# Patient Record
Sex: Female | Born: 1935 | Race: Black or African American | Hispanic: No | Smoking: Never smoker
Health system: Southern US, Community
[De-identification: ages and names within clinical notes are randomized; demographics above are authoritative.]

## PROBLEM LIST (undated history)

## (undated) DIAGNOSIS — H409 Unspecified glaucoma: Secondary | ICD-10-CM

## (undated) DIAGNOSIS — K219 Gastro-esophageal reflux disease without esophagitis: Secondary | ICD-10-CM

## (undated) DIAGNOSIS — M810 Age-related osteoporosis without current pathological fracture: Secondary | ICD-10-CM

## (undated) DIAGNOSIS — M48061 Spinal stenosis, lumbar region without neurogenic claudication: Secondary | ICD-10-CM

## (undated) DIAGNOSIS — R0602 Shortness of breath: Secondary | ICD-10-CM

## (undated) DIAGNOSIS — F419 Anxiety disorder, unspecified: Secondary | ICD-10-CM

## (undated) DIAGNOSIS — J45909 Unspecified asthma, uncomplicated: Secondary | ICD-10-CM

## (undated) DIAGNOSIS — E7800 Pure hypercholesterolemia, unspecified: Secondary | ICD-10-CM

## (undated) DIAGNOSIS — M1990 Unspecified osteoarthritis, unspecified site: Secondary | ICD-10-CM

## (undated) DIAGNOSIS — F329 Major depressive disorder, single episode, unspecified: Secondary | ICD-10-CM

## (undated) DIAGNOSIS — Z974 Presence of external hearing-aid: Secondary | ICD-10-CM

## (undated) DIAGNOSIS — F32A Depression, unspecified: Secondary | ICD-10-CM

## (undated) DIAGNOSIS — K5900 Constipation, unspecified: Secondary | ICD-10-CM

## (undated) DIAGNOSIS — Z8719 Personal history of other diseases of the digestive system: Secondary | ICD-10-CM

## (undated) DIAGNOSIS — R200 Anesthesia of skin: Secondary | ICD-10-CM

## (undated) DIAGNOSIS — D649 Anemia, unspecified: Secondary | ICD-10-CM

## (undated) DIAGNOSIS — I10 Essential (primary) hypertension: Secondary | ICD-10-CM

## (undated) DIAGNOSIS — R202 Paresthesia of skin: Secondary | ICD-10-CM

## (undated) DIAGNOSIS — J189 Pneumonia, unspecified organism: Secondary | ICD-10-CM

## (undated) DIAGNOSIS — M791 Myalgia, unspecified site: Secondary | ICD-10-CM

## (undated) HISTORY — PX: HERNIA REPAIR: SHX51

## (undated) HISTORY — PX: VASCULAR SURGERY: SHX849

## (undated) HISTORY — PX: CHOLECYSTECTOMY: SHX55

## (undated) HISTORY — PX: MULTIPLE TOOTH EXTRACTIONS: SHX2053

## (undated) HISTORY — PX: EYE SURGERY: SHX253

## (undated) HISTORY — PX: COLONOSCOPY: SHX174

## (undated) HISTORY — PX: APPENDECTOMY: SHX54

## (undated) HISTORY — PX: BACK SURGERY: SHX140

---

## 1999-01-13 ENCOUNTER — Other Ambulatory Visit: Admission: RE | Admit: 1999-01-13 | Discharge: 1999-01-13 | Payer: Self-pay | Admitting: Emergency Medicine

## 1999-07-15 ENCOUNTER — Ambulatory Visit (HOSPITAL_COMMUNITY): Admission: RE | Admit: 1999-07-15 | Discharge: 1999-07-15 | Payer: Self-pay | Admitting: Orthopedic Surgery

## 1999-07-15 ENCOUNTER — Encounter: Payer: Self-pay | Admitting: Orthopedic Surgery

## 1999-07-29 ENCOUNTER — Ambulatory Visit (HOSPITAL_COMMUNITY): Admission: RE | Admit: 1999-07-29 | Discharge: 1999-07-29 | Payer: Self-pay | Admitting: Orthopedic Surgery

## 1999-07-29 ENCOUNTER — Encounter: Payer: Self-pay | Admitting: Orthopedic Surgery

## 1999-08-12 ENCOUNTER — Encounter: Payer: Self-pay | Admitting: Orthopedic Surgery

## 1999-08-12 ENCOUNTER — Ambulatory Visit (HOSPITAL_COMMUNITY): Admission: RE | Admit: 1999-08-12 | Discharge: 1999-08-12 | Payer: Self-pay | Admitting: Orthopedic Surgery

## 1999-12-06 HISTORY — PX: OTHER SURGICAL HISTORY: SHX169

## 1999-12-17 ENCOUNTER — Encounter: Payer: Self-pay | Admitting: Emergency Medicine

## 1999-12-17 ENCOUNTER — Ambulatory Visit (HOSPITAL_COMMUNITY): Admission: RE | Admit: 1999-12-17 | Discharge: 1999-12-17 | Payer: Self-pay | Admitting: Emergency Medicine

## 2000-01-25 ENCOUNTER — Encounter: Admission: RE | Admit: 2000-01-25 | Discharge: 2000-01-25 | Payer: Self-pay | Admitting: Emergency Medicine

## 2000-01-25 ENCOUNTER — Encounter: Payer: Self-pay | Admitting: Emergency Medicine

## 2000-04-07 ENCOUNTER — Ambulatory Visit (HOSPITAL_COMMUNITY): Admission: RE | Admit: 2000-04-07 | Discharge: 2000-04-07 | Payer: Self-pay | Admitting: *Deleted

## 2000-04-07 ENCOUNTER — Encounter: Payer: Self-pay | Admitting: *Deleted

## 2000-06-08 ENCOUNTER — Encounter (HOSPITAL_BASED_OUTPATIENT_CLINIC_OR_DEPARTMENT_OTHER): Payer: Self-pay | Admitting: General Surgery

## 2000-06-12 ENCOUNTER — Encounter (INDEPENDENT_AMBULATORY_CARE_PROVIDER_SITE_OTHER): Payer: Self-pay | Admitting: *Deleted

## 2000-06-12 ENCOUNTER — Ambulatory Visit (HOSPITAL_COMMUNITY): Admission: RE | Admit: 2000-06-12 | Discharge: 2000-06-12 | Payer: Self-pay | Admitting: General Surgery

## 2000-07-24 ENCOUNTER — Ambulatory Visit (HOSPITAL_BASED_OUTPATIENT_CLINIC_OR_DEPARTMENT_OTHER): Admission: RE | Admit: 2000-07-24 | Discharge: 2000-07-25 | Payer: Self-pay | Admitting: *Deleted

## 2000-07-25 ENCOUNTER — Encounter (INDEPENDENT_AMBULATORY_CARE_PROVIDER_SITE_OTHER): Payer: Self-pay | Admitting: *Deleted

## 2001-01-16 ENCOUNTER — Encounter: Admission: RE | Admit: 2001-01-16 | Discharge: 2001-04-16 | Payer: Self-pay | Admitting: Anesthesiology

## 2001-01-26 ENCOUNTER — Encounter: Payer: Self-pay | Admitting: Emergency Medicine

## 2001-01-26 ENCOUNTER — Encounter: Admission: RE | Admit: 2001-01-26 | Discharge: 2001-01-26 | Payer: Self-pay | Admitting: Emergency Medicine

## 2001-04-10 ENCOUNTER — Other Ambulatory Visit: Admission: RE | Admit: 2001-04-10 | Discharge: 2001-04-10 | Payer: Self-pay | Admitting: Emergency Medicine

## 2001-09-18 ENCOUNTER — Ambulatory Visit (HOSPITAL_COMMUNITY): Admission: RE | Admit: 2001-09-18 | Discharge: 2001-09-18 | Payer: Self-pay | Admitting: General Surgery

## 2001-09-18 ENCOUNTER — Encounter (HOSPITAL_BASED_OUTPATIENT_CLINIC_OR_DEPARTMENT_OTHER): Payer: Self-pay | Admitting: General Surgery

## 2002-01-17 ENCOUNTER — Encounter: Admission: RE | Admit: 2002-01-17 | Discharge: 2002-01-17 | Payer: Self-pay | Admitting: Emergency Medicine

## 2002-01-17 ENCOUNTER — Encounter: Payer: Self-pay | Admitting: Emergency Medicine

## 2003-01-21 ENCOUNTER — Ambulatory Visit (HOSPITAL_COMMUNITY): Admission: RE | Admit: 2003-01-21 | Discharge: 2003-01-21 | Payer: Self-pay | Admitting: *Deleted

## 2003-01-21 ENCOUNTER — Encounter: Payer: Self-pay | Admitting: *Deleted

## 2003-06-27 ENCOUNTER — Encounter: Payer: Self-pay | Admitting: Emergency Medicine

## 2003-06-27 ENCOUNTER — Encounter: Admission: RE | Admit: 2003-06-27 | Discharge: 2003-06-27 | Payer: Self-pay | Admitting: Emergency Medicine

## 2004-08-31 ENCOUNTER — Ambulatory Visit (HOSPITAL_COMMUNITY): Admission: RE | Admit: 2004-08-31 | Discharge: 2004-08-31 | Payer: Self-pay | Admitting: *Deleted

## 2004-09-25 ENCOUNTER — Ambulatory Visit (HOSPITAL_COMMUNITY): Admission: RE | Admit: 2004-09-25 | Discharge: 2004-09-25 | Payer: Self-pay

## 2005-03-30 ENCOUNTER — Ambulatory Visit (HOSPITAL_COMMUNITY): Admission: RE | Admit: 2005-03-30 | Discharge: 2005-03-30 | Payer: Self-pay | Admitting: Neurosurgery

## 2005-04-01 ENCOUNTER — Encounter: Admission: RE | Admit: 2005-04-01 | Discharge: 2005-04-01 | Payer: Self-pay | Admitting: Emergency Medicine

## 2006-02-06 ENCOUNTER — Encounter: Admission: RE | Admit: 2006-02-06 | Discharge: 2006-02-06 | Payer: Self-pay | Admitting: Emergency Medicine

## 2006-04-03 ENCOUNTER — Encounter: Admission: RE | Admit: 2006-04-03 | Discharge: 2006-04-03 | Payer: Self-pay | Admitting: Emergency Medicine

## 2007-01-20 ENCOUNTER — Encounter: Admission: RE | Admit: 2007-01-20 | Discharge: 2007-01-20 | Payer: Self-pay | Admitting: Emergency Medicine

## 2007-02-09 ENCOUNTER — Inpatient Hospital Stay (HOSPITAL_COMMUNITY): Admission: RE | Admit: 2007-02-09 | Discharge: 2007-02-12 | Payer: Self-pay | Admitting: Neurosurgery

## 2007-03-14 ENCOUNTER — Encounter: Admission: RE | Admit: 2007-03-14 | Discharge: 2007-03-14 | Payer: Self-pay | Admitting: Neurosurgery

## 2007-04-27 ENCOUNTER — Encounter: Admission: RE | Admit: 2007-04-27 | Discharge: 2007-04-27 | Payer: Self-pay | Admitting: Emergency Medicine

## 2007-05-15 ENCOUNTER — Encounter: Admission: RE | Admit: 2007-05-15 | Discharge: 2007-08-13 | Payer: Self-pay | Admitting: Neurosurgery

## 2007-12-03 ENCOUNTER — Encounter: Admission: RE | Admit: 2007-12-03 | Discharge: 2007-12-03 | Payer: Self-pay | Admitting: Emergency Medicine

## 2008-04-30 ENCOUNTER — Encounter: Admission: RE | Admit: 2008-04-30 | Discharge: 2008-04-30 | Payer: Self-pay | Admitting: Emergency Medicine

## 2009-05-25 ENCOUNTER — Encounter: Admission: RE | Admit: 2009-05-25 | Discharge: 2009-05-25 | Payer: Self-pay | Admitting: Internal Medicine

## 2009-12-17 ENCOUNTER — Emergency Department (HOSPITAL_COMMUNITY): Admission: EM | Admit: 2009-12-17 | Discharge: 2009-12-17 | Payer: Self-pay | Admitting: Emergency Medicine

## 2010-03-31 ENCOUNTER — Encounter: Admission: RE | Admit: 2010-03-31 | Discharge: 2010-05-12 | Payer: Self-pay | Admitting: Specialist

## 2010-04-19 ENCOUNTER — Emergency Department (HOSPITAL_COMMUNITY): Admission: EM | Admit: 2010-04-19 | Discharge: 2010-04-19 | Payer: Self-pay | Admitting: Emergency Medicine

## 2010-05-26 ENCOUNTER — Encounter: Admission: RE | Admit: 2010-05-26 | Discharge: 2010-05-26 | Payer: Self-pay | Admitting: Internal Medicine

## 2011-04-19 ENCOUNTER — Other Ambulatory Visit: Payer: Self-pay | Admitting: Internal Medicine

## 2011-04-19 DIAGNOSIS — Z1231 Encounter for screening mammogram for malignant neoplasm of breast: Secondary | ICD-10-CM

## 2011-04-19 NOTE — Discharge Summary (Signed)
[name], Beverly Jimenez               ACCOUNT NO.:  [id number]   MEDICAL RECORD NO.:  [id number]          PATIENT TYPE:  INP   LOCATION:  3022                         FACILITY:  [facility]   PHYSICIAN:  Kathaleen Maser. Pool, M.D.    DATE OF BIRTH:  1924-12-18   DATE OF ADMISSION:  02/09/2007  DATE OF DISCHARGE:  02/12/2007                               DISCHARGE SUMMARY   FINAL DIAGNOSIS:  L3-4 and L4-5 stenosis with severe disk degeneration.   PROCEDURE:  L3-4 and L4-5 decompression and fusion with instrumentation.   HISTORY OF PRESENT ILLNESS:  [name] is a 77 year old female with  history of back and lower extremity pain, failing all conservative  management __________ demonstrates evidence of marked disk degeneration  and stenosis at L3-4 and L4-5.  The patient presents now for  decompression and fusion.   HOSPITAL COURSE:  The patient went to the operating room where an  uncomplicated L3-4 and L4-5 decompression and fusion __________ was  performed.  Postoperatively, the patient felt much improved.  Her lower  extremity pain, weakness and sensory loss were resolved.  Her wound was  healing well.  She was mobilized rapidly with physical therapy.  At the  time of discharge, the patient is ambulating without difficulty.  She  feels much improved.  She will be discharged to home.   CONDITION ON DISCHARGE:  Improved.           ______________________________  Kathaleen Maser Pool, M.D.     HAP/MEDQ  D:  04/24/2007  T:  04/24/2007  Job:  [id number]

## 2011-04-22 NOTE — Op Note (Signed)
[place]  Patient:    SAHRA, CONVERSE                        MRN: [id number] Proc. Date: 06/12/00 Adm. Date:  [id number] Attending:  Sonda Primes CC:         Mardene Celeste. Lurene Shadow, M.D. x 2                           Operative Report  PREOPERATIVE DIAGNOSIS:  Right inguinal hernia.  POSTOPERATIVE DIAGNOSIS:  Right direct and indirect inguinal hernia.  PROCEDURE:  Right inguinal herniorrhaphy with mesh.  SURGEON:  Mardene Celeste. Lurene Shadow, M.D.  ASSISTANT:  Magnus Ivan, R.N.F.A.  ANESTHESIA:  General.  INDICATIONS:  This patient is a 77 year old lady presenting with a large right-sided groin bulge diagnosed as an inguinal hernia and was brought to the operating room for repair.  PROCEDURE:  Following the induction of satisfactory general anesthesia, the patient positioned supinely, the lower abdomen was prepped and draped to be included in a sterile operative field.  Transverse incision carried out in the right lower quadrant and deepened through the skin and subcutaneous tissue, down through the external oblique aponeurosis.  Protection of the ilioinguinal nerve, retracted laterally and inferiorly.  The entire round ligament along with a very large indirect hernia sac was elevated and held.  Hesselbachs triangle there were also a very moderate-sized direct inguinal hernia.  The round ligament was detached at the pubic tubercle and secured with ties of 2-0 silk.  Dissection carried up and the round ligament separated from the sac. Round ligament again suture ligated at the internal ring with 2-0 silk sutures and the sac opened.  No intraabdominal contents noted.  Sac was then doubly ligated with a purse-string suture and then with a plain suture of 2-0 silk. The sac was then amputated and forwarded for pathologic evaluation.  The entire area of Hesselbachs triangle was then oversewn with a Prolene mesh sewn in at the pubic tubercle, carried  up along the conjoin tendon, around and enclosing the internal ring with internal oblique muscles and again from the pubic tubercle up along the shelving edge of Pouparts ligament to the internal ring.  Repair noted to be intact.  All areas of dissection checked for hemostasis and noted to be dry.  Sponge, instrument and sharp counts were verified.  Wound closed in layers as follows:  external oblique aponeurosis closed with a running suture of 3-0 Vicryl.  Subcutaneous tissues and Scarpas fascia closed with 3-0 Vicryl suture and the skin closed with a 4-0 Monocryl and reinforced with Steri-Strips and sterile dressings applied.  Anesthetic reversed.  Patient removed from the operating room to the recovery room in stable condition having tolerated the procedure well. DD:  06/12/00 TD:  06/12/00 Job: [id number]

## 2011-04-22 NOTE — Op Note (Signed)
[facility]  Patient:    Beverly Jimenez, Beverly Jimenez                        MRN: [id number] Proc. Date: 02/02/01 Adm. Date:  [id number] Attending:  Thyra Breed CC:         Kerrin Champagne, M.D.   Operative Report  PROCEDURE:  Lumbar epidural steroid injection.  DIAGNOSIS:  Lumbar spondylosis, spondylolisthesis and spinal stenosis.  ANESTHESIOLOGIST:  Thyra Breed, M.D.  INTERVAL HISTORY:  The patient has noted a pretty significant improvement overall with the epidural steroid injections.  She rates her pain a 4/10.  She has had no untoward effects from the injections.  PHYSICAL EXAMINATION:  VITAL SIGNS:  Blood pressure 106/46, heart rate 74, respiratory rate 20, O2 saturation 95%, pain level is 4/10.  BACK:  Her back showed good healing from the previous injection site.  DESCRIPTION OF PROCEDURE:  After informed consent was obtained, the patient was placed in the sitting position and monitored.  Her back was prepped with Betadine x 3.  A skin wheal was placed at the L4-L5 interspace with 1% lidocaine using a 25 gauge needle.  A 20 gauge Tuohy needle was introduced into the lumbar epidural space to a loss of resistance to preservative free normal saline.  There was no CSF or blood.  The depth was 4.5 cm.  Then 40 mg of Medrol and 10 ml of preservative normal saline was gently injected.  The needle was flushed with preservative free normal saline and removed intact.  Postprocedure condition:  Stable.  DISCHARGE INSTRUCTIONS: 1. Resume previous diet. 2. Limitation of activities per instruction sheet. 3. Continue on current medications. 4. The patient plans to follow up with Dr. Otelia Sergeant.  I advised her that she    would need to wait at least six months before receiving another lumbar    epidural steroid injection. DD:  02/02/01 TD:  02/04/01 Job: [id number]

## 2011-04-22 NOTE — Procedures (Signed)
[facility]  Patient:    Beverly Jimenez, Beverly Jimenez                        MRN: [id number] Proc. Date: 01/17/01 Adm. Date:  [id number] Attending:  Thyra Breed CC:         Dr. Elberta Fortis, M.D.   Procedure Report  PROCEDURE:  Lumbar epidural steroid injection.  DIAGNOSIS:  Lumbar spondylosis with spondylolisthesis and underlying lumbar spinal stenosis.  HISTORY:  Beverly Jimenez is a very pleasant 77 year old who is sent to [place] by Dr. Vira Browns for a series of lumbar epidural steroid injections. The patient stated that she was in her usual state of health up until a few years ago when her chronic intermittent low back pain became more of a persistent problem when she tripped over a vacuum cleaner canister in March of 2000. At that time, she complained of left hip discomfort which she described as sharp discomfort. She was sent to the [facility] and received a series of three epidurals, the first one being helpful and the other two being fruitless. Nevertheless, she did fairly well up until about a year ago when she developed more of a progressive soreness in her left hip and down the posterior aspects of her thighs and calves made worse by standing long periods of time or getting up in the mornings. It is improved by moving about. She has been taking some Aleve which she notes will help to reduce the pain fairly significantly as well as Tylenol. She has been prescribed Celebrex which she did not find very helpful. She had some intermittent numbness and tingling of her feet but no bowel or bladder incontinence or weakness. She has been through physical therapy with what sounds like some traction and stretching exercises.  CURRENT MEDICATIONS:  Aleve, Prevacid, and Tylenol.  ALLERGIES:  Amoxicillin, Macrodantin and sulfonamides.  ACTIVE MEDICAL PROBLEMS:  Gastroesophageal reflux disease with underlying hiatal hernia for which she sees  Dr. Lorenz Coaster.  FAMILY HISTORY:  Positive for diabetes, hypertension, coronary artery disease, and osteoarthritis.  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  SOCIAL HISTORY:  The patient is a nonsmoker and nondrinker. She formerly worked at [employer] and she retired.  REVIEW OF SYSTEMS:  GENERAL:  Negative. HEAD: Negative. EYES:  Significant for corrective lenses. NOSE/MOUTH/THROAT:  Significant for recurrent congestion. EARS:  Significant for decreased hearing acuity of the left ear. PULMONARY: Negative. CARDIOVASCULAR: Negative. GI: Significant for hiatal hernia/gastroesophageal reflux disease. GU:  Negative. MUSCULOSKELETAL:  See HPI. No appendicular skeletal complaints. CUTANEOUS:  Negative except for drug eruptions. HEMATOLOGIC: Remote history of anemia. ENDOCRINE:  Negative. PSYCHIATRIC:  The patient did have two major losses in 1997 with her husband passing and her son but she has worked through the grief of this. ALLERGY/IMMUNOLOGIC: Negative.  PHYSICAL EXAMINATION:  VITAL SIGNS:  Blood pressure is 122/52, heart rate is 72, respiratory rate 20, O2 saturations 100%, pain level is 8/10.  GENERAL:  This is a very pleasant female in no acute distress.  HEENT:  Head was normocephalic, atraumatic. Eyes, extraocular movements intact with some amblyopia of the right eye. Nose patent nares. Oropharynx, she had an upper dental plate. Mucosa intact.  NECK:  Supple with carotids 2+ and symmetric without bruits.  LUNGS:  Clear.  HEART:  Regular rate and rhythm.  BREASTS/ABDOMINAL/PELVIC/RECTAL:  Not performed.  BACK:  Revealed no tenderness to percussion over the vertebra with minimal increased discomfort with hyperextension and forward flexion with negative straight leg raise signs.  EXTREMITIES:  The patient demonstrated bony enlargement of the first MTPs of the feet with  some pedis plan+us. Dorsalis pedis pulse were 2+ and symmetric. She had varicosities of the lower extremities, right greater than left. Hands demonstrated radial pulses 2+ and symmetric with a flexion deformity of her right fifth finger.  NEUROLOGIC:  The patient was oriented x 4. Cranial nerves II-XII are grossly intact. Deep tendon reflexes were 1 to 2+ and symmetric in the upper extremities and 0 to 1+ and symmetric in the lower extremities with downgoing toes. Motor was 5/5 with symmetric bulk and tone. Sensory was intact to vibratory sense and pinprick. Coordination was grossly intact.  IMPRESSION: 1. Low back pain predominantly on the basis of lumbar spondylosis and    spondylolisthesis with underlying lumbar spinal stenosis. 2. Other medical problems per Dr. Lorenz Coaster which include gastroesophageal reflux    disease, varicose veins, and amblyopia. DISPOSITION:  I discussed the potential risks, benefits and limitations of a lumbar epidural steroid injection as well as the side effects of corticosteroids in detail with the patient and her daughter. Their questions were answered. Alternative treatments were also discussed.  DESCRIPTION OF PROCEDURE:  After informed consent was obtained, the patient was placed in the sitting position and monitored. The patients back was prepped with Betadine x 3. A skin wheal was raised at the L4-5 interspace with 1 percent lidocaine. A 20 gauge Tuohy needle was introduced to the lumbar epidural space to loss of resistance to preservative free normal saline. There was no cerebrospinal fluid nor blood. 40 mg of Medrol and 8 ml of preservative free normal saline was gently injected. The needle was flushed with preservative free normal saline and removed intact.  CONDITION POST PROCEDURE:  Stable.  DISCHARGE INSTRUCTIONS:  Resume previous diet. Limitations in activities per  instruction sheet as outlined by my assistant today. Continue on  current medications but consider apple cider vinegar/honey combination. I reviewed the combination with the patient and her daughter. Follow-up in one to two weeks for repeat injection. DD:  01/17/01 TD:  01/17/01 Job: [id number]

## 2011-04-22 NOTE — Procedures (Signed)
[facility]  Patient:    Beverly Jimenez, Beverly Jimenez                        MRN: [id number] Proc. Date: 01/25/01 Adm. Date:  [id number] Attending:  Thyra Breed CC:         Dr. Elberta Fortis, M.D.   Procedure Report  PROCEDURE:  Lumbar epidural steroid injection.  DIAGNOSIS:  Lumbar spondylosis, spondylolisthesis, and underlying lumbar spinal stenosis.  ANESTHESIOLOGIST:  Thyra Breed, M.D.  INTERVAL HISTORY: The patient has noted marked attenuation of her lower back discomfort.  She continues to have a little bit of tingling out to her lower extremity, but otherwise she is doing very well.  She tolerated the injection well.  PHYSICAL EXAMINATION:  VITAL SIGNS:  The patient is afebrile with vital signs stable.  Please see flow sheet for details.  BACK:  Shows good healing from previous injection site.  DESCRIPTION OF PROCEDURE:  After informed consent was obtained, the patient was placed in the sitting position and monitored.  Her back was prepped with Betadine x 3.  A skin wheal was raised at the L5-S1 interspace with 1% lidocaine.  A 20-gauge Tuohy needle was introduced in the lumbar epidural space to loss of resistance to preservative free normal saline.  There was no CSF nor blood.  The depth was 4.5 cm.  Medrol 40 mg and 8 ml preservative free normal saline was gently injected.  The needle was flushed with preservative free normal saline and removed intact.  POSTPROCEDURE CONDITION:  Stable.  DISCHARGE INSTRUCTIONS: 1. Resume previous diet. 2. Limitation of activities per instruction sheet as outlined by my    assistant today. 3. Continue on current medications. 4. Follow up with me in one to two weeks for a third injection. DD:  01/25/01 TD:  01/26/01 Job: [id number]

## 2011-04-22 NOTE — Op Note (Signed)
[place]. [facility]  Patient:    Beverly, Jimenez                        MRN: [id number] Proc. Date: 07/24/00 Adm. Date:  [id number] Attending:  Carmelia Roller                           Operative Report  INDICATION AND JUSTIFICATION FOR PROCEDURE:  Beverly Jimenez is a 77 year old patient who was first seen in our office in 1995.  At that time she had pulsatile tinnitus a.s. during her initial physical examination.  She was noted to have a red mass in the inferior portion of her left middle ear.  She underwent an extensive evaluation, including CT scan, showing a 4 mm mass over the promontory of the left middle ear.  She also had 24-hour urine for catecholamines and metanephrines which was within normal limits.  Beverly Jimenez underwent excision of a left glomus tympanicum on August 10, 1994.  At the time of her surgery the tumor appeared to be entirely removed.  Beverly Jimenez was then asymptomatic until this year.  She had begun to experience a pulsatile tinnitus again in the left ear.  Physical examination did not reveal any visible mass.  She had a CT scan which once again showed a mass in the inferior middle ear along the inferior aspect of the promontory.  It was similar in appearance to her previous CT scan.  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  She was to have excision under general anesthesia.  The indications and complications of the procedure, including recurrence of the tumor, hearing loss, complete hearing loss, and facial paralysis were discussed in detail.  PREOPERATIVE DIAGNOSIS:  Recurrent glomus tympanicum.  POSTOPERATIVE DIAGNOSIS:  Recurrent glomus tympanicum.  PROCEDURE PERFORMED:  Hypotympanotomy with excision of recurrent glomus tympanicum a.s.  SURGEON: 1. Alfonse Flavors, M.D. 2. Carolan Shiver, M.D.  DESCRIPTION OF OPERATION:  Beverly Jimenez is brought to the operating room, placed supine on the operating table.   She was induced with general anesthesia and intubated with orotracheal tube.  Xylocaine 2% with 1:50,000 epinephrine was injected in four quadrants in the left external auditory canal.  Xylocaine 1% with 1:100,000 epinephrine was injected along the postauricular sulcus and over the temporalis fascia superiorly.  The left ear and face were prepped and draped in a sterile fashion.  Visualization of the tympanic membrane showed it to be clear.  An inferior tympanotomy flap was incised from 2 oclock posteriorly to 9 oclock anteriorly.  Flap was elevated to the fibrous annulus.  Brisk persistent bleeding was encountered from a venous sinus immediately lateral to the anterior annulus, control of the bleeding required cautery.  After the annulus was elevated superiorly, tumor could be visualized in the inferior middle ear.  Tympanic membrane was carefully elevated off of the tumor mass.  The mass extended from just below the eustachian tube orifice anteriorly to just below the round window nitch posteriorly.  The mass extended inferior to the inferior bony annulus.  A bone was removed with cutting and diamond burs to enlarge the medial canal and to lower the inferior bony annulus.  Because of difficulty with visualization it was elected to convert the procedure from a transcanal procedure into a postauricular procedure.  A postauricular incision was made.  This was carried down to the level of the temporalis fascia  superiorly and to mastoid cortex inferiorly.  A T-shaped incision was made along the linear temporalis and extending toward the mass to a tip.  Soft tissue was elevated with Jovita Kussmaul and [employer]. The bony canal was identified.  ______ perforata and spine of Henle were noted.  The posterior canal skin was elevated forward, and the middle entered.  Posterior canal skin and auricle were secured forward with a Bellucci tractor, shut back retractor, elevated the temporalis muscle.   With improved visualization the bony canal was enlarged further.  Bone was removed from the anterior and inferior bony annulus.  Bone was also carefully removed from the posterior annulus with diamond burs.  When improved visualization was obtained, the anterior, superior, and posterior margins of the tumor were well visualized.  Tumor still extended below the level of the annulus and into a hypotympanic air cell in the posteroinferior quadrant.  When visualization was sufficient, Tabb knife, Rosen knife, and whirlybird excavators were used to elevate the mucosa over the promontory, peeling the tumor laterally.  The bulk of the tumor was removed and passed off for permanent section.  Small amounts of residual tumor remained in several shallow air cells over the area of the carotid plate and beneath the promontory.  This was removed with Tabb knife.  There was a hypotympanic cell which appeared to extend from the inferior timpani medial and deep to the course of the facial nerve.  This was carefully dissected with whirlybird excavators.  All visible and palpable tumor was removed, although the area was not well visualized.  Reinspection of the middle ear showed no residual visible tumor.  The ear was irrigated with saline and with antibiotic solution.  Surgicel was packed into the hypotympanic cell inferiorly to control small amount of bleeding present. Gelfoam discs were placed in the inferior middle ear.  The tympanic membrane and tympanotomy flap were turned back against the canal wall.  The inferior tympanin was well closed.  There was a small tear in the tympanic membrane just superior to the short process of the malleus.  This small 1 mm tear was filled with fibrous tissue.  The surface of the tympanic membrane was covered with Gelfoam disks, tympanotomy flap was secured in place by packing the medial canal with Gelfoam.  Postauricular incision was closed with interrupted 4-0 chromic  and running 4-0 nylon.  The lateral canal and Kurners flap was secured in place with Gelfoam packing.  A Glasscock dressing was applied.  Ms.  Jimenez tolerated the procedure well and was taken to the recovery room in satisfactory condition.  SUMMARY OF PATHOLOGY:  Ms. Torry had recurrent glomus tympanicum extending from the eustachian tube orifice to the inferior round window nitch.  It also extended into the hypotympanum.  All visible tumor was removed, but there is a questionable surgical margin in a deep hypotympanic cell which appeared to extend medial to the course of the facial nerve, between the facial nerve and the posterior dome of the jugular bulb.  FOLLOWUP CARE:  Ms. Mayol will be admitted for overnight observation, IV hydration, and IV analgesia, discharge in the morning as anticipated.  DISCHARGE MEDICATIONS: 1. Biaxin. 2. Tylenol with codeine. 3. Cortisporin drops.  She will be re-examined in our office in one week. DD:  07/24/00 TD:  07/24/00 Job: [id number]

## 2011-04-22 NOTE — Op Note (Signed)
Beverly Jimenez, Beverly Jimenez               ACCOUNT NO.:  [id number]   MEDICAL RECORD NO.:  [id number]          PATIENT TYPE:  INP   LOCATION:  3172                         FACILITY:  [facility]   PHYSICIAN:  Kathaleen Maser. Pool, M.D.    DATE OF BIRTH:  Dec 23, 1924   DATE OF PROCEDURE:  02/09/2007  DATE OF DISCHARGE:                               OPERATIVE REPORT   SERVICE:  Neurosurgery.   PREOPERATIVE DIAGNOSES:  1. L3-4 degenerative spondylolisthesis with severe stenosis.  2. L4-5 spondylosis with severe stenosis.   POSTOPERATIVE DIAGNOSES:  1. L3-4 degenerative spondylolisthesis with severe stenosis.  2. L4-5 spondylosis with severe stenosis.   PROCEDURE NOTE:  1. L3-4 and L4-5 decompressive laminectomy with L3, L4 and L5      foraminotomies, more than would be required for simple interbody      fusion alone.  2. L3-4 and L4-5 posterior lumbar body fusion utilizing Tangent      interbody allograft bone wedges, Telamon interbody PEEK cages and      local autografting.  3. L3 through L5 posterolateral arthrodesis utilizing segmental      pedicle screw sedation and local autografting.   SURGEON:  Kathaleen Maser. Pool, M.D.   ASSISTANT:  Tia Alert, M.D.   ANESTHESIA:  General endotracheal.   INDICATIONS:  Beverly Jimenez is a 77 year old female with a history of  severe left lower extremity pain secondary to her severe stenosis at L3-  4 and L4-5.  Patient has failed conservative management and she presents  now for decompression and fusion in hopes of improving her symptoms.   OPERATIVE NOTE:  The patient was brought to the operating room and  placed on the operating table in supine position.  After adequate level  of anesthesia was achieved, the patient was prone onto the Wilson frame,  properly padded and the patient's lumbar region was prepped and draped  sterilely.  A 10 blade was used make a linear skin incision overlying  the L2, L3, L4 and L5 levels.  This was carried sharply in the  midline.  A subperiosteal dissection was then performed, exposing the lamina and  facet joints of L2, L3, L4 and L5, as well as the transverse processes  of L3, L4 and L5.  A deep self-retaining retractor was placed and  intraoperative fluoroscopy views in the L3, L4 and L5 levels were  confirmed.  Decompressive laminectomy was then performed using Leksell  rongeurs, Kerrison rongeurs and a high-speed drill to remove the entire  lamina of L3, the entire lamina of L4 and the superior aspect of the  lamina of L5.  Inferior facetectomies were performed bilaterally at L3  and L4.  Superior facetectomies were performed bilaterally at L4 and L5.  All bone was cleaned and used in later autografting.  The ligament  flavum was elevated and resected in a piecemeal fashion using Kerrison  rongeurs.  Underlying thecal sac was then applied.  A wide decompressive  foraminotomies were then performed along the course of the exiting L3,  L4 and L5 nerve roots.  Epidural venous plexus was coagulated and cut.  Starting first the left side at L3-4, the thecal sac and nerve roots  were protected.  Disk space was then incised with a 15 blade.  An  aggressive diskectomy then performed using pituitary rongeurs, upward-  angled and downward-angled rongeurs and Epstein curettes.  The procedure  was then repeated on the contralateral side and then repeated  bilaterally at L4-5.  Returning to L3-4, the disk space was distracted  to 11 mm with the distractor left in the patient's right side.  Thecal  sac and nerve roots were protected on the left side.  Disk space was  then reamed and then cut with a 10-mm Tangent instrument.  Soft tissues  were removed from the interspace.  A 10 x 26-mm Telamon cage packed with  autograft was then impacted into place and recessed approximately 1 mm  from the posterior cortical margin.  Distractor was removed.  Thecal sac  and nerve room were protected on the right side.  Disk space  was once  again reamed and then cut with Tangent instruments.  Soft tissues  removed from the interspace.  Disk space underwent further curettage.  Morselized autograft was packed into the interspace.  A 10 x 26-mm  Tangent wedge was then impacted into place and recessed approximately mm  from the posterior cortical margin.  The procedure was then repeated in  a similar fashion at L4-5, again without complication.  Pedicles at L3,  L4 and L5 were identified, utilizing superficial landmarks and  intraoperative fluoroscopy.  Superficial bone overlying the pedicle was  then removed using a high-speed drill.  Each pedicle was then probed  using a pedicle awl.  Each pedicle awl track was then tapped with a 5.25-  mm screw tap.  Each screw tap hole was probed and found be solid within  bone.  The 6.75 x 45-mm radius screws from [employer] were used bilaterally  at L3, 6.75 x 40-mm screws used bilaterally at L4 and L5.  Transverse  processes of L3, L4 and L5 were then decorticated using a high-speed  drill.  Morselized autograft was packed posterolaterally.  A short-  segment titanium rod was then contoured and placed into the screw heads  at L3, L4 and L5.  Locking caps were then placed over the screws.  The  locking caps were then engaged with construct under adequate  compression.  Final images revealed good position of bone grafts at the  proper operative level and normal alignment of spine.  A transverse  connector was also placed.  Wound was irrigated 1 final time and a  medium Hemovac drain was left in the epidural space.  Wound was then  closed in layers with Vicryl sutures.  Steri-Strips and sterile  dressings were applied.  There were no apparent complications.  The  patient tolerated the procedure well and she returns to the recovery  room postoperatively.           ______________________________  Kathaleen Maser Pool, M.D.    HAP/MEDQ  D:  02/09/2007  T:  02/10/2007  Job:  [id number]

## 2011-05-30 ENCOUNTER — Ambulatory Visit
Admission: RE | Admit: 2011-05-30 | Discharge: 2011-05-30 | Disposition: A | Discharge status: Home / Self Care | Payer: Medicare Other | Source: Ambulatory Visit | Attending: Internal Medicine | Admitting: Internal Medicine

## 2011-05-30 DIAGNOSIS — Z1231 Encounter for screening mammogram for malignant neoplasm of breast: Secondary | ICD-10-CM

## 2012-01-13 DIAGNOSIS — M810 Age-related osteoporosis without current pathological fracture: Secondary | ICD-10-CM | POA: Diagnosis not present

## 2012-01-13 DIAGNOSIS — E7800 Pure hypercholesterolemia, unspecified: Secondary | ICD-10-CM | POA: Diagnosis not present

## 2012-01-13 DIAGNOSIS — R7301 Impaired fasting glucose: Secondary | ICD-10-CM | POA: Diagnosis not present

## 2012-01-13 DIAGNOSIS — D649 Anemia, unspecified: Secondary | ICD-10-CM | POA: Diagnosis not present

## 2012-01-13 DIAGNOSIS — Z0000 Encounter for general adult medical examination without abnormal findings: Secondary | ICD-10-CM | POA: Diagnosis not present

## 2012-01-20 DIAGNOSIS — Z0000 Encounter for general adult medical examination without abnormal findings: Secondary | ICD-10-CM | POA: Diagnosis not present

## 2012-01-20 DIAGNOSIS — D649 Anemia, unspecified: Secondary | ICD-10-CM | POA: Diagnosis not present

## 2012-01-20 DIAGNOSIS — E7800 Pure hypercholesterolemia, unspecified: Secondary | ICD-10-CM | POA: Diagnosis not present

## 2012-01-20 DIAGNOSIS — R7301 Impaired fasting glucose: Secondary | ICD-10-CM | POA: Diagnosis not present

## 2012-01-20 DIAGNOSIS — Z1212 Encounter for screening for malignant neoplasm of rectum: Secondary | ICD-10-CM | POA: Diagnosis not present

## 2012-02-23 DIAGNOSIS — R490 Dysphonia: Secondary | ICD-10-CM | POA: Diagnosis not present

## 2012-02-23 DIAGNOSIS — H6120 Impacted cerumen, unspecified ear: Secondary | ICD-10-CM | POA: Diagnosis not present

## 2012-02-23 DIAGNOSIS — H905 Unspecified sensorineural hearing loss: Secondary | ICD-10-CM | POA: Diagnosis not present

## 2012-02-23 DIAGNOSIS — K219 Gastro-esophageal reflux disease without esophagitis: Secondary | ICD-10-CM | POA: Diagnosis not present

## 2012-02-29 DIAGNOSIS — H26499 Other secondary cataract, unspecified eye: Secondary | ICD-10-CM | POA: Diagnosis not present

## 2012-02-29 DIAGNOSIS — H4011X0 Primary open-angle glaucoma, stage unspecified: Secondary | ICD-10-CM | POA: Diagnosis not present

## 2012-04-24 ENCOUNTER — Other Ambulatory Visit: Payer: Self-pay | Admitting: Internal Medicine

## 2012-04-24 DIAGNOSIS — Z1231 Encounter for screening mammogram for malignant neoplasm of breast: Secondary | ICD-10-CM

## 2012-05-03 DIAGNOSIS — T148XXA Other injury of unspecified body region, initial encounter: Secondary | ICD-10-CM | POA: Diagnosis not present

## 2012-05-03 DIAGNOSIS — D649 Anemia, unspecified: Secondary | ICD-10-CM | POA: Diagnosis not present

## 2012-05-08 DIAGNOSIS — M949 Disorder of cartilage, unspecified: Secondary | ICD-10-CM | POA: Diagnosis not present

## 2012-05-08 DIAGNOSIS — M899 Disorder of bone, unspecified: Secondary | ICD-10-CM | POA: Diagnosis not present

## 2012-05-31 ENCOUNTER — Ambulatory Visit: Payer: No Typology Code available for payment source

## 2012-06-13 ENCOUNTER — Ambulatory Visit: Payer: No Typology Code available for payment source

## 2012-06-13 ENCOUNTER — Ambulatory Visit
Admission: RE | Admit: 2012-06-13 | Discharge: 2012-06-13 | Disposition: A | Discharge status: Home / Self Care | Payer: No Typology Code available for payment source | Source: Ambulatory Visit | Attending: Internal Medicine | Admitting: Internal Medicine

## 2012-06-13 DIAGNOSIS — Z1231 Encounter for screening mammogram for malignant neoplasm of breast: Secondary | ICD-10-CM | POA: Diagnosis not present

## 2012-07-20 DIAGNOSIS — Z0000 Encounter for general adult medical examination without abnormal findings: Secondary | ICD-10-CM | POA: Diagnosis not present

## 2012-07-20 DIAGNOSIS — IMO0001 Reserved for inherently not codable concepts without codable children: Secondary | ICD-10-CM | POA: Diagnosis not present

## 2012-07-20 DIAGNOSIS — D649 Anemia, unspecified: Secondary | ICD-10-CM | POA: Diagnosis not present

## 2012-07-20 DIAGNOSIS — E7800 Pure hypercholesterolemia, unspecified: Secondary | ICD-10-CM | POA: Diagnosis not present

## 2012-07-20 DIAGNOSIS — R7301 Impaired fasting glucose: Secondary | ICD-10-CM | POA: Diagnosis not present

## 2012-08-14 DIAGNOSIS — H905 Unspecified sensorineural hearing loss: Secondary | ICD-10-CM | POA: Diagnosis not present

## 2012-08-14 DIAGNOSIS — D356 Benign neoplasm of aortic body and other paraganglia: Secondary | ICD-10-CM | POA: Diagnosis not present

## 2012-08-14 DIAGNOSIS — K219 Gastro-esophageal reflux disease without esophagitis: Secondary | ICD-10-CM | POA: Diagnosis not present

## 2012-08-14 DIAGNOSIS — H6120 Impacted cerumen, unspecified ear: Secondary | ICD-10-CM | POA: Diagnosis not present

## 2012-08-24 DIAGNOSIS — H43399 Other vitreous opacities, unspecified eye: Secondary | ICD-10-CM | POA: Diagnosis not present

## 2012-08-24 DIAGNOSIS — H4011X0 Primary open-angle glaucoma, stage unspecified: Secondary | ICD-10-CM | POA: Diagnosis not present

## 2012-09-05 DIAGNOSIS — Z23 Encounter for immunization: Secondary | ICD-10-CM | POA: Diagnosis not present

## 2012-09-05 DIAGNOSIS — D649 Anemia, unspecified: Secondary | ICD-10-CM | POA: Diagnosis not present

## 2012-09-05 DIAGNOSIS — IMO0001 Reserved for inherently not codable concepts without codable children: Secondary | ICD-10-CM | POA: Diagnosis not present

## 2012-09-05 DIAGNOSIS — R700 Elevated erythrocyte sedimentation rate: Secondary | ICD-10-CM | POA: Diagnosis not present

## 2012-09-10 DIAGNOSIS — R700 Elevated erythrocyte sedimentation rate: Secondary | ICD-10-CM | POA: Diagnosis not present

## 2012-09-10 DIAGNOSIS — IMO0001 Reserved for inherently not codable concepts without codable children: Secondary | ICD-10-CM | POA: Diagnosis not present

## 2012-09-10 DIAGNOSIS — J069 Acute upper respiratory infection, unspecified: Secondary | ICD-10-CM | POA: Diagnosis not present

## 2012-10-02 DIAGNOSIS — R0982 Postnasal drip: Secondary | ICD-10-CM | POA: Diagnosis not present

## 2012-10-02 DIAGNOSIS — J45909 Unspecified asthma, uncomplicated: Secondary | ICD-10-CM | POA: Diagnosis not present

## 2012-10-02 DIAGNOSIS — K219 Gastro-esophageal reflux disease without esophagitis: Secondary | ICD-10-CM | POA: Diagnosis not present

## 2012-10-03 DIAGNOSIS — Z79899 Other long term (current) drug therapy: Secondary | ICD-10-CM | POA: Diagnosis not present

## 2012-10-03 DIAGNOSIS — K219 Gastro-esophageal reflux disease without esophagitis: Secondary | ICD-10-CM | POA: Diagnosis not present

## 2012-10-03 DIAGNOSIS — M810 Age-related osteoporosis without current pathological fracture: Secondary | ICD-10-CM | POA: Diagnosis not present

## 2012-10-17 ENCOUNTER — Encounter (HOSPITAL_COMMUNITY): Payer: Self-pay

## 2012-10-17 ENCOUNTER — Ambulatory Visit (HOSPITAL_COMMUNITY)
Admission: RE | Admit: 2012-10-17 | Discharge: 2012-10-17 | Disposition: A | Discharge status: Home / Self Care | Payer: Medicare Other | Source: Ambulatory Visit | Attending: Internal Medicine | Admitting: Internal Medicine

## 2012-10-17 ENCOUNTER — Other Ambulatory Visit (HOSPITAL_COMMUNITY): Payer: Self-pay | Admitting: Internal Medicine

## 2012-10-17 DIAGNOSIS — M810 Age-related osteoporosis without current pathological fracture: Secondary | ICD-10-CM | POA: Diagnosis not present

## 2012-10-17 HISTORY — DX: Age-related osteoporosis without current pathological fracture: M81.0

## 2012-10-17 HISTORY — DX: Unspecified asthma, uncomplicated: J45.909

## 2012-10-17 HISTORY — DX: Pure hypercholesterolemia, unspecified: E78.00

## 2012-10-17 HISTORY — DX: Gastro-esophageal reflux disease without esophagitis: K21.9

## 2012-10-17 HISTORY — DX: Shortness of breath: R06.02

## 2012-10-17 HISTORY — DX: Unspecified osteoarthritis, unspecified site: M19.90

## 2012-10-17 HISTORY — DX: Myalgia, unspecified site: M79.10

## 2012-10-17 HISTORY — DX: Anemia, unspecified: D64.9

## 2012-10-17 MED ORDER — SODIUM CHLORIDE 0.9 % IV SOLN
INTRAVENOUS | Status: DC
  Administered 2012-10-17: 12:00:00 via INTRAVENOUS

## 2012-10-17 MED ORDER — ZOLEDRONIC ACID 5 MG/100ML IV SOLN: 5.0000 mg | Freq: Once | INTRAVENOUS | Status: DC

## 2012-10-17 MED ORDER — ZOLEDRONIC ACID 5 MG/100ML IV SOLN
5.0000 mg | Freq: Once | INTRAVENOUS | Status: AC
  Administered 2012-10-17: 5 mg via INTRAVENOUS
  Filled 2012-10-17: qty 100

## 2012-10-17 MED ORDER — SODIUM CHLORIDE 0.9 % IV SOLN: Freq: Once | INTRAVENOUS | Status: DC

## 2012-11-16 DIAGNOSIS — K219 Gastro-esophageal reflux disease without esophagitis: Secondary | ICD-10-CM | POA: Diagnosis not present

## 2013-01-14 DIAGNOSIS — R7301 Impaired fasting glucose: Secondary | ICD-10-CM | POA: Diagnosis not present

## 2013-01-14 DIAGNOSIS — E7800 Pure hypercholesterolemia, unspecified: Secondary | ICD-10-CM | POA: Diagnosis not present

## 2013-01-14 DIAGNOSIS — M810 Age-related osteoporosis without current pathological fracture: Secondary | ICD-10-CM | POA: Diagnosis not present

## 2013-01-23 DIAGNOSIS — E7800 Pure hypercholesterolemia, unspecified: Secondary | ICD-10-CM | POA: Diagnosis not present

## 2013-01-23 DIAGNOSIS — IMO0001 Reserved for inherently not codable concepts without codable children: Secondary | ICD-10-CM | POA: Diagnosis not present

## 2013-01-23 DIAGNOSIS — Z1331 Encounter for screening for depression: Secondary | ICD-10-CM | POA: Diagnosis not present

## 2013-01-23 DIAGNOSIS — Z0000 Encounter for general adult medical examination without abnormal findings: Secondary | ICD-10-CM | POA: Diagnosis not present

## 2013-01-25 DIAGNOSIS — Z1212 Encounter for screening for malignant neoplasm of rectum: Secondary | ICD-10-CM | POA: Diagnosis not present

## 2013-02-14 DIAGNOSIS — M5430 Sciatica, unspecified side: Secondary | ICD-10-CM | POA: Diagnosis not present

## 2013-02-14 DIAGNOSIS — M25559 Pain in unspecified hip: Secondary | ICD-10-CM | POA: Diagnosis not present

## 2013-02-26 DIAGNOSIS — H53029 Refractive amblyopia, unspecified eye: Secondary | ICD-10-CM | POA: Diagnosis not present

## 2013-02-26 DIAGNOSIS — H4011X0 Primary open-angle glaucoma, stage unspecified: Secondary | ICD-10-CM | POA: Diagnosis not present

## 2013-02-26 DIAGNOSIS — H43399 Other vitreous opacities, unspecified eye: Secondary | ICD-10-CM | POA: Diagnosis not present

## 2013-03-05 ENCOUNTER — Ambulatory Visit: Payer: Medicare Other | Attending: Orthopaedic Surgery

## 2013-03-05 DIAGNOSIS — IMO0001 Reserved for inherently not codable concepts without codable children: Secondary | ICD-10-CM | POA: Insufficient documentation

## 2013-03-05 DIAGNOSIS — M7989 Other specified soft tissue disorders: Secondary | ICD-10-CM | POA: Diagnosis not present

## 2013-03-11 ENCOUNTER — Ambulatory Visit: Payer: Medicare Other | Admitting: Physical Therapy

## 2013-03-11 DIAGNOSIS — M7989 Other specified soft tissue disorders: Secondary | ICD-10-CM | POA: Diagnosis not present

## 2013-03-11 DIAGNOSIS — IMO0001 Reserved for inherently not codable concepts without codable children: Secondary | ICD-10-CM | POA: Diagnosis not present

## 2013-03-14 ENCOUNTER — Ambulatory Visit: Payer: Medicare Other

## 2013-03-14 DIAGNOSIS — IMO0001 Reserved for inherently not codable concepts without codable children: Secondary | ICD-10-CM | POA: Diagnosis not present

## 2013-03-14 DIAGNOSIS — M7989 Other specified soft tissue disorders: Secondary | ICD-10-CM | POA: Diagnosis not present

## 2013-03-18 ENCOUNTER — Ambulatory Visit: Payer: Medicare Other | Admitting: Physical Therapy

## 2013-03-18 DIAGNOSIS — M7989 Other specified soft tissue disorders: Secondary | ICD-10-CM | POA: Diagnosis not present

## 2013-03-18 DIAGNOSIS — IMO0001 Reserved for inherently not codable concepts without codable children: Secondary | ICD-10-CM | POA: Diagnosis not present

## 2013-03-20 ENCOUNTER — Ambulatory Visit: Payer: Medicare Other

## 2013-03-20 DIAGNOSIS — IMO0001 Reserved for inherently not codable concepts without codable children: Secondary | ICD-10-CM | POA: Diagnosis not present

## 2013-03-20 DIAGNOSIS — M7989 Other specified soft tissue disorders: Secondary | ICD-10-CM | POA: Diagnosis not present

## 2013-03-22 ENCOUNTER — Encounter (HOSPITAL_COMMUNITY): Payer: Self-pay | Admitting: Emergency Medicine

## 2013-03-22 DIAGNOSIS — Z862 Personal history of diseases of the blood and blood-forming organs and certain disorders involving the immune mechanism: Secondary | ICD-10-CM | POA: Diagnosis not present

## 2013-03-22 DIAGNOSIS — Z79899 Other long term (current) drug therapy: Secondary | ICD-10-CM | POA: Diagnosis not present

## 2013-03-22 DIAGNOSIS — J029 Acute pharyngitis, unspecified: Secondary | ICD-10-CM | POA: Insufficient documentation

## 2013-03-22 DIAGNOSIS — R059 Cough, unspecified: Secondary | ICD-10-CM | POA: Insufficient documentation

## 2013-03-22 DIAGNOSIS — K219 Gastro-esophageal reflux disease without esophagitis: Secondary | ICD-10-CM | POA: Diagnosis not present

## 2013-03-22 DIAGNOSIS — Z7982 Long term (current) use of aspirin: Secondary | ICD-10-CM | POA: Insufficient documentation

## 2013-03-22 DIAGNOSIS — M129 Arthropathy, unspecified: Secondary | ICD-10-CM | POA: Diagnosis not present

## 2013-03-22 DIAGNOSIS — J45909 Unspecified asthma, uncomplicated: Secondary | ICD-10-CM | POA: Diagnosis not present

## 2013-03-22 DIAGNOSIS — Z8739 Personal history of other diseases of the musculoskeletal system and connective tissue: Secondary | ICD-10-CM | POA: Diagnosis not present

## 2013-03-22 DIAGNOSIS — IMO0002 Reserved for concepts with insufficient information to code with codable children: Secondary | ICD-10-CM | POA: Insufficient documentation

## 2013-03-22 DIAGNOSIS — J3489 Other specified disorders of nose and nasal sinuses: Secondary | ICD-10-CM | POA: Insufficient documentation

## 2013-03-22 DIAGNOSIS — J040 Acute laryngitis: Secondary | ICD-10-CM | POA: Diagnosis not present

## 2013-03-22 DIAGNOSIS — R05 Cough: Secondary | ICD-10-CM | POA: Insufficient documentation

## 2013-03-22 DIAGNOSIS — R0982 Postnasal drip: Secondary | ICD-10-CM | POA: Diagnosis not present

## 2013-03-22 DIAGNOSIS — J40 Bronchitis, not specified as acute or chronic: Secondary | ICD-10-CM | POA: Diagnosis not present

## 2013-03-22 DIAGNOSIS — J209 Acute bronchitis, unspecified: Secondary | ICD-10-CM | POA: Diagnosis not present

## 2013-03-22 DIAGNOSIS — R079 Chest pain, unspecified: Secondary | ICD-10-CM | POA: Diagnosis not present

## 2013-03-22 DIAGNOSIS — J3089 Other allergic rhinitis: Secondary | ICD-10-CM | POA: Insufficient documentation

## 2013-03-22 DIAGNOSIS — E7800 Pure hypercholesterolemia, unspecified: Secondary | ICD-10-CM | POA: Diagnosis not present

## 2013-03-22 DIAGNOSIS — J309 Allergic rhinitis, unspecified: Secondary | ICD-10-CM | POA: Diagnosis not present

## 2013-03-22 LAB — CBC
HCT: 30.2 % — ABNORMAL LOW (ref 36.0–46.0)
RDW: 14.4 % (ref 11.5–15.5)
WBC: 8.7 10*3/uL (ref 4.0–10.5)

## 2013-03-22 LAB — POCT I-STAT TROPONIN I: Troponin i, poc: 0 ng/mL (ref 0.00–0.08)

## 2013-03-22 NOTE — ED Notes (Signed)
C/o upper, generalized CP today, sore throat X3d, cough since last night, CP not associated with other s/s, no radiation, no meds pta, no V/D, NAD

## 2013-03-23 ENCOUNTER — Emergency Department (HOSPITAL_COMMUNITY)
Admit: 2013-03-23 | Discharge: 2013-03-23 | Disposition: A | Discharge status: Home / Self Care | Payer: Medicare Other | Attending: Emergency Medicine | Admitting: Emergency Medicine

## 2013-03-23 ENCOUNTER — Emergency Department (HOSPITAL_COMMUNITY)
Admission: EM | Admit: 2013-03-23 | Discharge: 2013-03-23 | Disposition: A | Discharge status: Home / Self Care | Payer: Medicare Other | Attending: Emergency Medicine | Admitting: Emergency Medicine

## 2013-03-23 DIAGNOSIS — J040 Acute laryngitis: Secondary | ICD-10-CM

## 2013-03-23 DIAGNOSIS — J40 Bronchitis, not specified as acute or chronic: Secondary | ICD-10-CM

## 2013-03-23 DIAGNOSIS — R05 Cough: Secondary | ICD-10-CM | POA: Diagnosis not present

## 2013-03-23 DIAGNOSIS — Z9109 Other allergy status, other than to drugs and biological substances: Secondary | ICD-10-CM

## 2013-03-23 LAB — BASIC METABOLIC PANEL
BUN: 13 mg/dL (ref 6–23)
Chloride: 99 mEq/L (ref 96–112)
GFR calc Af Amer: 61 mL/min — ABNORMAL LOW (ref >90)
Potassium: 4.2 mEq/L (ref 3.5–5.1)
Sodium: 135 mEq/L (ref 135–145)

## 2013-03-23 LAB — PRO B NATRIURETIC PEPTIDE: Pro B Natriuretic peptide (BNP): 66.9 pg/mL (ref 0–450)

## 2013-03-23 MED ORDER — DEXAMETHASONE 4 MG PO TABS: 4.0000 mg | ORAL_TABLET | Freq: Two times a day (BID) | ORAL | Status: DC

## 2013-03-23 MED ORDER — BENZONATATE 100 MG PO CAPS: 100.0000 mg | ORAL_CAPSULE | Freq: Three times a day (TID) | ORAL | Status: DC

## 2013-03-23 MED ORDER — LORATADINE 10 MG PO TABS: 10.0000 mg | ORAL_TABLET | Freq: Every day | ORAL | Status: DC

## 2013-03-23 MED ORDER — HYDROCODONE-ACETAMINOPHEN 5-325 MG PO TABS
1.0000 | ORAL_TABLET | Freq: Once | ORAL | Status: AC
  Administered 2013-03-23: 1 via ORAL
  Filled 2013-03-23: qty 1

## 2013-03-23 NOTE — ED Notes (Signed)
Patient said she was walking to the mailbox and she felt like something went in her nose so she started coughing.  The patient said her throat felt sore and then it started making her chest feel tight.  The patient does have asthma so she said she used her inhaler.  Her daughter recommended she come to the ED to be evaluated.

## 2013-03-23 NOTE — ED Notes (Signed)
Patient is alert and orientedx4.  Patient was explained discharge instructions and they understood them with no questions.  Tressie Stalker, her son in law,  Is taking the patient home.

## 2013-03-23 NOTE — ED Provider Notes (Signed)
History     CSN: [id number]  Arrival date & time 03/22/13  2209   First MD Initiated Contact with Patient 03/23/13 0321      Chief Complaint  Patient presents with  . Chest Pain  . Sore Throat    (Consider location/radiation/quality/duration/timing/severity/associated sxs/prior treatment) HPI Comments: Pt with burning throat pain associated with several days of dry paroxysmal coughing.  No back pain, sweats, N/V.  No difficulty swallowing.  No meds taken PTA.  Has a h/o bronchitis in the past and has had allergies and takes advair normally.  Pt has been on claritin and allegra in the past, not now.  Has used flonase in the past as well.  No HA, sinus pain.  Appetite is ok per pt, no night sweats, weight loss.    The history is provided by the patient and a relative.    Past Medical History  Diagnosis Date  . Shortness of breath   . GERD (gastroesophageal reflux disease)   . High cholesterol   . Arthritis     shoulder and knee pain  . Anemia   . Osteoporosis   . Asthma   . Myalgia     Past Surgical History  Procedure Laterality Date  . 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 Back surgery    . Cholecystectomy    . Hernia repair    . Appendectomy    . Eye surgery      catarac  . Vascular surgery      varicose vein surgery    No family history on file.  History  Substance Use Topics  . Smoking status: Never Smoker   . Smokeless tobacco: Not on file  . Alcohol Use: No    OB History   Grav Para Term Preterm Abortions TAB SAB Ect Mult Living                  Review of Systems  Constitutional: Negative for fever, chills and diaphoresis.  HENT: Positive for sore throat, rhinorrhea, voice change and postnasal drip. Negative for congestion, sneezing, trouble swallowing and sinus pressure.   Respiratory: Positive for cough. Negative for shortness of breath and wheezing.   Gastrointestinal: Negative for nausea and vomiting.  Musculoskeletal: Negative for  back pain.  All other systems reviewed and are negative.    Allergies  Macrodantin; Amoxicillin; and Sulfa antibiotics  Home Medications   Current Outpatient Rx  Name  Route  Sig  Dispense  Refill  . albuterol (PROVENTIL HFA;VENTOLIN HFA) 108 (90 BASE) MCG/ACT inhaler   Inhalation   Inhale 2 puffs into the lungs every 6 (six) hours as needed.         Marland Kitchen aspirin 81 MG tablet   Oral   Take 81 mg by mouth daily.         . benzonatate (TESSALON) 100 MG capsule   Oral   Take 1 capsule (100 mg total) by mouth every 8 (eight) hours.   21 capsule   0   . bimatoprost (LUMIGAN) 0.03 % ophthalmic solution   Both Eyes   Place 1 drop into both eyes at bedtime.         . calcium-vitamin D 250-100 MG-UNIT per tablet   Oral   Take 1 tablet by mouth 2 (two) times daily.         . cholecalciferol (VITAMIN D) 1000 UNITS tablet   Oral   Take 1,000 Units by mouth daily.         Marland Kitchen  cyanocobalamin 1000 MCG tablet   Oral   Take 100 mcg by mouth daily.         Marland Kitchen dexamethasone (DECADRON) 4 MG tablet   Oral   Take 1 tablet (4 mg total) by mouth 2 (two) times daily with a meal.   10 tablet   0   . esomeprazole (NEXIUM) 40 MG capsule   Oral   Take 40 mg by mouth daily before breakfast.         . fexofenadine (ALLEGRA) 60 MG tablet   Oral   Take 60 mg by mouth daily. As needed         . fish oil-omega-3 fatty acids 1000 MG capsule   Oral   Take 1 g by mouth daily.         . fluticasone (FLONASE) 50 MCG/ACT nasal spray   Nasal   Place 2 sprays into the nose daily.         . Fluticasone-Salmeterol (ADVAIR) 250-50 MCG/DOSE AEPB   Inhalation   Inhale 1 puff into the lungs every 12 (twelve) hours.         Marland Kitchen loratadine (CLARITIN) 10 MG tablet   Oral   Take 10 mg by mouth daily.         Marland Kitchen loratadine (CLARITIN) 10 MG tablet   Oral   Take 1 tablet (10 mg total) by mouth daily.   14 tablet   0   . niacin 500 MG tablet   Oral   Take 500 mg by mouth daily  with breakfast.         . vitamin C (ASCORBIC ACID) 500 MG tablet   Oral   Take 500 mg by mouth 2 (two) times daily.         . vitamin E 400 UNIT capsule   Oral   Take 400 Units by mouth daily.         . zoledronic acid (RECLAST) 5 MG/100ML SOLN   Intravenous   Inject 5 mg into the vein once.           BP 128/69  Pulse 78  Temp(Src) 97.7 F (36.5 C) (Oral)  Resp 16  SpO2 100%  Physical Exam  Nursing note and vitals reviewed. Constitutional: She is oriented to person, place, and time. She appears well-developed and well-nourished. No distress.  HENT:  Head: Normocephalic and atraumatic.  Mouth/Throat: Oropharynx is clear and moist. No oropharyngeal exudate.  No exudate, irritation and mild erythema to posteiror oropharynx  Cardiovascular: Normal rate and regular rhythm.   No murmur heard. Pulmonary/Chest: Effort normal. No respiratory distress. She has no wheezes.  Paroxysmal coughing, frequently  Abdominal: Soft. She exhibits no distension. There is no tenderness. There is no rebound.  Musculoskeletal: She exhibits no edema.  Lymphadenopathy:    She has no cervical adenopathy.  Neurological: She is alert and oriented to person, place, and time. No cranial nerve deficit.  Skin: Skin is warm. No rash noted.  Psychiatric: She has a normal mood and affect.    ED Course  Procedures (including critical care time)  Labs Reviewed  CBC - Abnormal; Notable for the following:    Hemoglobin 10.0 (*)    HCT 30.2 (*)    MCV 75.9 (*)    MCH 25.1 (*)    All other components within normal limits  BASIC METABOLIC PANEL - Abnormal; Notable for the following:    Glucose, Bld 105 (*)    GFR calc non Af Denyse Dago  52 (*)    GFR calc Af Amer 61 (*)    All other components within normal limits  PRO B NATRIURETIC PEPTIDE  POCT I-STAT TROPONIN I   Dg Chest 2 View  03/23/2013  *RADIOLOGY REPORT*  Clinical Data: Sore throat, cough, runny nose, cold symptoms, history asthma  CHEST  - 2 VIEW  Comparison: 04/19/2010  Findings: Normal heart size and pulmonary vascularity. Calcified tortuous aorta. Minimal peribronchial thickening, chronic. No definite infiltrate, pleural effusion, or pneumothorax. Bones appear diffusely demineralized. Prior lumbar fusion.  IMPRESSION: No acute abnormalities.   Original Report Authenticated By: Ulyses Southward, M.D.      1. Bronchitis   2. Laryngitis   3. Environmental allergies    ra sat is 100% and I interpret to be normal  ECG at time 23:07 shows NSR at rate 82, normal axis, no ST or T wave abn's, normal intervals.  Interpretation is NML ECG.  MDM  Pt with voice change indicating laryngitis.  Likely coming from allergies and post nasal drip and also some bronchitis,  No fever, no suspicion for pneumonia.  No need for abx.  Symptoms treatment and steroids written for for throat swelling.  Not toxic appearing, no airway compromise.          Gavin Pound. Oletta Lamas, MD 03/24/13 1610

## 2013-03-23 NOTE — Discharge Instructions (Signed)
Laryngitis At the top of your windpipe is your voice box. It is the source of your voice. Inside your voice box are 2 bands of muscles called vocal cords. When you breathe, your vocal cords are relaxed and open so that air can get into the lungs. When you decide to say something, these cords come together and vibrate. The sound from these vibrations goes into your throat and comes out through your mouth as sound. Laryngitis is an inflammation of the vocal cords that causes hoarseness, cough, loss of voice, sore throat, and dry throat. Laryngitis can be temporary (acute) or long-term (chronic). Most cases of acute laryngitis improve with time.Chronic laryngitis lasts for more than 3 weeks. CAUSES Laryngitis can often be related to excessive smoking, talking, or yelling, as well as inhalation of toxic fumes and allergies. Acute laryngitis is usually caused by a viral infection, vocal strain, measles or mumps, or bacterial infections. Chronic laryngitis is usually caused by vocal cord strain, vocal cord injury, postnasal drip, growths on the vocal cords, or acid reflux. SYMPTOMS   Cough.  Sore throat.  Dry throat. RISK FACTORS  Respiratory infections.  Exposure to irritating substances, such as cigarette smoke, excessive amounts of alcohol, stomach acids, and workplace chemicals.  Voice trauma, such as vocal cord injury from shouting or speaking too loud. DIAGNOSIS  Your cargiver will perform a physical exam. During the physical exam, your caregiver will examine your throat. The most common sign of laryngitis is hoarseness. Laryngoscopy may be necessary to confirm the diagnosis of this condition. This procedure allows your caregiver to look into the larynx. HOME CARE INSTRUCTIONS  Drink enough fluids to keep your urine clear or pale yellow.  Rest until you no longer have symptoms or as directed by your caregiver.  Breathe in moist air.  Take all medicine as directed by your  caregiver.  Do not smoke.  Talk as little as possible (this includes whispering).  Write on paper instead of talking until your voice is back to normal.  Follow up with your caregiver if your condition has not improved after 10 days. SEEK MEDICAL CARE IF:   You have trouble breathing.  You cough up blood.  You have persistent fever.  You have increasing pain.  You have difficulty swallowing. MAKE SURE YOU:  Understand these instructions.  Will watch your condition.  Will get help right away if you are not doing well or get worse. Document Released: 11/21/2005 Document Revised: 02/13/2012 Document Reviewed: 01/27/2011 Exit[employer] Patient Information 2013 [employer].  

## 2013-03-24 ENCOUNTER — Encounter (HOSPITAL_COMMUNITY): Payer: Self-pay | Admitting: Emergency Medicine

## 2013-03-25 DIAGNOSIS — R062 Wheezing: Secondary | ICD-10-CM | POA: Diagnosis not present

## 2013-03-25 DIAGNOSIS — Z6828 Body mass index (BMI) 28.0-28.9, adult: Secondary | ICD-10-CM | POA: Diagnosis not present

## 2013-03-25 DIAGNOSIS — J209 Acute bronchitis, unspecified: Secondary | ICD-10-CM | POA: Diagnosis not present

## 2013-03-25 DIAGNOSIS — R0982 Postnasal drip: Secondary | ICD-10-CM | POA: Diagnosis not present

## 2013-03-25 DIAGNOSIS — K219 Gastro-esophageal reflux disease without esophagitis: Secondary | ICD-10-CM | POA: Diagnosis not present

## 2013-03-25 DIAGNOSIS — J45909 Unspecified asthma, uncomplicated: Secondary | ICD-10-CM | POA: Diagnosis not present

## 2013-03-25 DIAGNOSIS — R05 Cough: Secondary | ICD-10-CM | POA: Diagnosis not present

## 2013-03-26 ENCOUNTER — Encounter: Payer: Medicare Other | Admitting: Physical Therapy

## 2013-03-28 ENCOUNTER — Encounter: Payer: Medicare Other | Admitting: Physical Therapy

## 2013-04-02 ENCOUNTER — Ambulatory Visit: Payer: Medicare Other

## 2013-04-02 DIAGNOSIS — R5381 Other malaise: Secondary | ICD-10-CM | POA: Diagnosis not present

## 2013-04-02 DIAGNOSIS — J45909 Unspecified asthma, uncomplicated: Secondary | ICD-10-CM | POA: Diagnosis not present

## 2013-04-02 DIAGNOSIS — Z6828 Body mass index (BMI) 28.0-28.9, adult: Secondary | ICD-10-CM | POA: Diagnosis not present

## 2013-04-02 DIAGNOSIS — J209 Acute bronchitis, unspecified: Secondary | ICD-10-CM | POA: Diagnosis not present

## 2013-04-04 ENCOUNTER — Ambulatory Visit: Payer: Medicare Other

## 2013-04-08 ENCOUNTER — Ambulatory Visit: Payer: Medicare Other | Attending: Orthopaedic Surgery | Admitting: Rehabilitation

## 2013-04-08 DIAGNOSIS — IMO0001 Reserved for inherently not codable concepts without codable children: Secondary | ICD-10-CM | POA: Diagnosis not present

## 2013-04-08 DIAGNOSIS — M7989 Other specified soft tissue disorders: Secondary | ICD-10-CM | POA: Insufficient documentation

## 2013-04-10 ENCOUNTER — Ambulatory Visit: Payer: Medicare Other | Admitting: Rehabilitation

## 2013-04-10 DIAGNOSIS — M7989 Other specified soft tissue disorders: Secondary | ICD-10-CM | POA: Diagnosis not present

## 2013-04-10 DIAGNOSIS — IMO0001 Reserved for inherently not codable concepts without codable children: Secondary | ICD-10-CM | POA: Diagnosis not present

## 2013-04-18 ENCOUNTER — Ambulatory Visit: Payer: Medicare Other

## 2013-04-18 DIAGNOSIS — M7989 Other specified soft tissue disorders: Secondary | ICD-10-CM | POA: Diagnosis not present

## 2013-04-18 DIAGNOSIS — IMO0001 Reserved for inherently not codable concepts without codable children: Secondary | ICD-10-CM | POA: Diagnosis not present

## 2013-04-22 ENCOUNTER — Ambulatory Visit: Payer: Medicare Other | Admitting: Rehabilitation

## 2013-04-22 DIAGNOSIS — IMO0001 Reserved for inherently not codable concepts without codable children: Secondary | ICD-10-CM | POA: Diagnosis not present

## 2013-04-22 DIAGNOSIS — M7989 Other specified soft tissue disorders: Secondary | ICD-10-CM | POA: Diagnosis not present

## 2013-04-24 ENCOUNTER — Ambulatory Visit: Payer: Medicare Other

## 2013-04-24 DIAGNOSIS — M7989 Other specified soft tissue disorders: Secondary | ICD-10-CM | POA: Diagnosis not present

## 2013-04-24 DIAGNOSIS — IMO0001 Reserved for inherently not codable concepts without codable children: Secondary | ICD-10-CM | POA: Diagnosis not present

## 2013-05-01 ENCOUNTER — Ambulatory Visit: Payer: Medicare Other | Admitting: Physical Therapy

## 2013-05-01 ENCOUNTER — Ambulatory Visit: Payer: Medicare Other | Admitting: Rehabilitation

## 2013-05-01 DIAGNOSIS — IMO0001 Reserved for inherently not codable concepts without codable children: Secondary | ICD-10-CM | POA: Diagnosis not present

## 2013-05-01 DIAGNOSIS — M7989 Other specified soft tissue disorders: Secondary | ICD-10-CM | POA: Diagnosis not present

## 2013-05-03 ENCOUNTER — Ambulatory Visit: Payer: Medicare Other

## 2013-05-03 DIAGNOSIS — M7989 Other specified soft tissue disorders: Secondary | ICD-10-CM | POA: Diagnosis not present

## 2013-05-03 DIAGNOSIS — IMO0001 Reserved for inherently not codable concepts without codable children: Secondary | ICD-10-CM | POA: Diagnosis not present

## 2013-05-06 ENCOUNTER — Other Ambulatory Visit: Payer: Self-pay

## 2013-05-06 DIAGNOSIS — Z1231 Encounter for screening mammogram for malignant neoplasm of breast: Secondary | ICD-10-CM

## 2013-05-22 ENCOUNTER — Other Ambulatory Visit: Payer: Self-pay | Admitting: Internal Medicine

## 2013-05-22 DIAGNOSIS — Z6828 Body mass index (BMI) 28.0-28.9, adult: Secondary | ICD-10-CM | POA: Diagnosis not present

## 2013-05-22 DIAGNOSIS — IMO0002 Reserved for concepts with insufficient information to code with codable children: Secondary | ICD-10-CM | POA: Diagnosis not present

## 2013-05-29 ENCOUNTER — Ambulatory Visit
Admission: RE | Admit: 2013-05-29 | Discharge: 2013-05-29 | Disposition: A | Discharge status: Home / Self Care | Payer: Medicare Other | Source: Ambulatory Visit | Attending: Internal Medicine | Admitting: Internal Medicine

## 2013-05-29 DIAGNOSIS — M5126 Other intervertebral disc displacement, lumbar region: Secondary | ICD-10-CM | POA: Diagnosis not present

## 2013-05-29 DIAGNOSIS — IMO0002 Reserved for concepts with insufficient information to code with codable children: Secondary | ICD-10-CM

## 2013-05-29 DIAGNOSIS — M48061 Spinal stenosis, lumbar region without neurogenic claudication: Secondary | ICD-10-CM | POA: Diagnosis not present

## 2013-05-29 MED ORDER — GADOBENATE DIMEGLUMINE 529 MG/ML IV SOLN
15.0000 mL | Freq: Once | INTRAVENOUS | Status: AC | PRN
  Administered 2013-05-29: 15 mL via INTRAVENOUS

## 2013-06-14 ENCOUNTER — Ambulatory Visit
Admission: RE | Admit: 2013-06-14 | Discharge: 2013-06-14 | Disposition: A | Discharge status: Home / Self Care | Payer: Medicare Other | Source: Ambulatory Visit

## 2013-06-14 DIAGNOSIS — Z1231 Encounter for screening mammogram for malignant neoplasm of breast: Secondary | ICD-10-CM | POA: Diagnosis not present

## 2013-06-19 ENCOUNTER — Other Ambulatory Visit: Payer: Self-pay | Admitting: Neurosurgery

## 2013-06-19 DIAGNOSIS — M5126 Other intervertebral disc displacement, lumbar region: Secondary | ICD-10-CM | POA: Diagnosis not present

## 2013-06-20 ENCOUNTER — Encounter (HOSPITAL_COMMUNITY): Payer: Self-pay | Admitting: Pharmacy Technician

## 2013-06-27 ENCOUNTER — Other Ambulatory Visit (HOSPITAL_COMMUNITY): Payer: Medicare Other

## 2013-06-27 ENCOUNTER — Encounter (HOSPITAL_COMMUNITY): Payer: Self-pay

## 2013-06-27 ENCOUNTER — Encounter (HOSPITAL_COMMUNITY)
Admission: RE | Admit: 2013-06-27 | Discharge: 2013-06-27 | Disposition: A | Discharge status: Home / Self Care | Payer: Medicare Other | Source: Ambulatory Visit | Attending: Neurosurgery | Admitting: Neurosurgery

## 2013-06-27 DIAGNOSIS — Z01812 Encounter for preprocedural laboratory examination: Secondary | ICD-10-CM | POA: Insufficient documentation

## 2013-06-27 DIAGNOSIS — M5126 Other intervertebral disc displacement, lumbar region: Secondary | ICD-10-CM | POA: Insufficient documentation

## 2013-06-27 HISTORY — DX: Major depressive disorder, single episode, unspecified: F32.9

## 2013-06-27 HISTORY — DX: Pneumonia, unspecified organism: J18.9

## 2013-06-27 HISTORY — DX: Unspecified glaucoma: H40.9

## 2013-06-27 HISTORY — DX: Depression, unspecified: F32.A

## 2013-06-27 HISTORY — DX: Anesthesia of skin: R20.0

## 2013-06-27 HISTORY — DX: Anesthesia of skin: R20.2

## 2013-06-27 LAB — CBC WITH DIFFERENTIAL/PLATELET
Basophils Relative: 1 % (ref 0–1)
Eosinophils Absolute: 0.3 10*3/uL (ref 0.0–0.7)
Hemoglobin: 10.1 g/dL — ABNORMAL LOW (ref 12.0–15.0)
Lymphs Abs: 2 10*3/uL (ref 0.7–4.0)
MCH: 25.4 pg — ABNORMAL LOW (ref 26.0–34.0)
MCHC: 32.2 g/dL (ref 30.0–36.0)
Monocytes Relative: 7 % (ref 3–12)
Neutro Abs: 3.5 10*3/uL (ref 1.7–7.7)
Neutrophils Relative %: 55 % (ref 43–77)
Platelets: 264 10*3/uL (ref 150–400)
RBC: 3.98 MIL/uL (ref 3.87–5.11)

## 2013-06-27 LAB — BASIC METABOLIC PANEL
Calcium: 10.4 mg/dL (ref 8.4–10.5)
GFR calc Af Amer: 59 mL/min — ABNORMAL LOW (ref >90)
GFR calc non Af Amer: 51 mL/min — ABNORMAL LOW (ref >90)
Glucose, Bld: 118 mg/dL — ABNORMAL HIGH (ref 70–99)
Potassium: 4.4 mEq/L (ref 3.5–5.1)
Sodium: 136 mEq/L (ref 135–145)

## 2013-06-27 LAB — SURGICAL PCR SCREEN: MRSA, PCR: NEGATIVE

## 2013-06-27 NOTE — Pre-Procedure Instructions (Signed)
[address]  06/27/2013   Your procedure is scheduled on:  Fri, Aug 1 @ 9:50 AM  Report to [facility] at 7:45 AM.  Call this number if you have problems the morning of surgery: [phone]   Remember:   Do not eat food or drink liquids after midnight.   Take these medicines the morning of surgery with A SIP OF WATER: Albuterol<Bring Your Inhaler With You>,Esomeprazole(Nexium), and Advair     Stop taking your Aspirin,Fish Oil,Naprosyn,Red Yeast Rice.No Goody's,BC's,Ibuprofen,or any Herbal Medications                   Do not wear jewelry, make-up or nail polish.  Do not wear lotions, powders, or perfumes. You may wear deodorant.  Do not shave 48 hours prior to surgery.   Do not bring valuables to the hospital.  [facility] is not responsible                   for any belongings or valuables.  Contacts, dentures or bridgework may not be worn into surgery.  Leave suitcase in the car. After surgery it may be brought to your room.  For patients admitted to the hospital, checkout time is 11:00 AM the day of  discharge.   Patients discharged the day of surgery will not be allowed to drive  home.    Special Instructions: Shower using CHG 2 nights before surgery and the night before surgery.  If you shower the day of surgery use CHG.  Use special wash - you have one bottle of CHG for all showers.  You should use approximately 1/3 of the bottle for each shower.   Please read over the following fact sheets that you were given: Pain Booklet, Coughing and Deep Breathing, MRSA Information and Surgical Site Infection Prevention

## 2013-07-04 MED ORDER — VANCOMYCIN HCL IN DEXTROSE 1-5 GM/200ML-% IV SOLN
1000.0000 mg | INTRAVENOUS | Status: AC
  Administered 2013-07-05: 1000 mg via INTRAVENOUS
  Filled 2013-07-04: qty 200

## 2013-07-05 ENCOUNTER — Ambulatory Visit (HOSPITAL_COMMUNITY)
Admission: RE | Admit: 2013-07-05 | Discharge: 2013-07-06 | Disposition: A | Discharge status: Home / Self Care | Payer: Medicare Other | Source: Ambulatory Visit | Attending: Neurosurgery | Admitting: Neurosurgery

## 2013-07-05 ENCOUNTER — Encounter (HOSPITAL_COMMUNITY)
Admission: RE | Disposition: A | Discharge status: Home / Self Care | Payer: Self-pay | Source: Ambulatory Visit | Attending: Neurosurgery

## 2013-07-05 ENCOUNTER — Inpatient Hospital Stay (HOSPITAL_COMMUNITY): Payer: Medicare Other | Admitting: Anesthesiology

## 2013-07-05 ENCOUNTER — Encounter (HOSPITAL_COMMUNITY): Payer: Self-pay | Admitting: Anesthesiology

## 2013-07-05 ENCOUNTER — Encounter (HOSPITAL_COMMUNITY): Payer: Self-pay | Admitting: *Deleted

## 2013-07-05 DIAGNOSIS — R0602 Shortness of breath: Secondary | ICD-10-CM | POA: Diagnosis not present

## 2013-07-05 DIAGNOSIS — Z981 Arthrodesis status: Secondary | ICD-10-CM | POA: Insufficient documentation

## 2013-07-05 DIAGNOSIS — D649 Anemia, unspecified: Secondary | ICD-10-CM | POA: Diagnosis not present

## 2013-07-05 DIAGNOSIS — Z79899 Other long term (current) drug therapy: Secondary | ICD-10-CM | POA: Diagnosis not present

## 2013-07-05 DIAGNOSIS — M5126 Other intervertebral disc displacement, lumbar region: Principal | ICD-10-CM | POA: Diagnosis present

## 2013-07-05 DIAGNOSIS — K219 Gastro-esophageal reflux disease without esophagitis: Secondary | ICD-10-CM | POA: Diagnosis not present

## 2013-07-05 HISTORY — PX: LUMBAR LAMINECTOMY/DECOMPRESSION MICRODISCECTOMY: SHX5026

## 2013-07-05 SURGERY — LUMBAR LAMINECTOMY/DECOMPRESSION MICRODISCECTOMY 1 LEVEL
Anesthesia: General | Site: Spine Lumbar | Laterality: Left | Wound class: Clean

## 2013-07-05 MED ORDER — BUPIVACAINE HCL (PF) 0.25 % IJ SOLN
INTRAMUSCULAR | Status: DC | PRN
  Administered 2013-07-05: 15 mL

## 2013-07-05 MED ORDER — ONDANSETRON HCL 4 MG/2ML IJ SOLN
INTRAMUSCULAR | Status: DC | PRN
  Administered 2013-07-05: 4 mg via INTRAVENOUS

## 2013-07-05 MED ORDER — CYCLOBENZAPRINE HCL 10 MG PO TABS
10.0000 mg | ORAL_TABLET | Freq: Three times a day (TID) | ORAL | Status: DC | PRN
  Administered 2013-07-05 – 2013-07-06 (×2): 10 mg via ORAL
  Filled 2013-07-05 (×2): qty 1

## 2013-07-05 MED ORDER — NEOSTIGMINE METHYLSULFATE 1 MG/ML IJ SOLN
INTRAMUSCULAR | Status: DC | PRN
  Administered 2013-07-05: 5 mg via INTRAVENOUS

## 2013-07-05 MED ORDER — VANCOMYCIN HCL IN DEXTROSE 1-5 GM/200ML-% IV SOLN
1000.0000 mg | Freq: Once | INTRAVENOUS | Status: AC
  Administered 2013-07-05: 1000 mg via INTRAVENOUS
  Filled 2013-07-05: qty 200

## 2013-07-05 MED ORDER — PHENOL 1.4 % MT LIQD: 1.0000 | OROMUCOSAL | Status: DC | PRN

## 2013-07-05 MED ORDER — ASPIRIN EC 81 MG PO TBEC
81.0000 mg | DELAYED_RELEASE_TABLET | Freq: Every day | ORAL | Status: DC
Start: 2013-07-05 — End: 2013-07-06
  Filled 2013-07-05 (×2): qty 1

## 2013-07-05 MED ORDER — BIMATOPROST 0.01 % OP SOLN
1.0000 [drp] | Freq: Every day | OPHTHALMIC | Status: DC
  Filled 2013-07-05: qty 2.5

## 2013-07-05 MED ORDER — SODIUM CHLORIDE 0.9 % IR SOLN
Status: DC | PRN
  Administered 2013-07-05: 11:00:00

## 2013-07-05 MED ORDER — ARTIFICIAL TEARS OP OINT
TOPICAL_OINTMENT | OPHTHALMIC | Status: DC | PRN
  Administered 2013-07-05: 1 via OPHTHALMIC

## 2013-07-05 MED ORDER — FENTANYL CITRATE 0.05 MG/ML IJ SOLN: 25.0000 ug | INTRAMUSCULAR | Status: DC | PRN

## 2013-07-05 MED ORDER — LACTATED RINGERS IV SOLN
INTRAVENOUS | Status: DC
  Administered 2013-07-05: 08:00:00 via INTRAVENOUS

## 2013-07-05 MED ORDER — DROPERIDOL 2.5 MG/ML IJ SOLN: 0.6250 mg | INTRAMUSCULAR | Status: DC | PRN

## 2013-07-05 MED ORDER — SODIUM CHLORIDE 0.9 % IJ SOLN: 3.0000 mL | INTRAMUSCULAR | Status: DC | PRN

## 2013-07-05 MED ORDER — HYDROCODONE-ACETAMINOPHEN 5-325 MG PO TABS
1.0000 | ORAL_TABLET | ORAL | Status: DC | PRN
  Administered 2013-07-05 – 2013-07-06 (×2): 1 via ORAL
  Filled 2013-07-05: qty 2
  Filled 2013-07-05 (×2): qty 1

## 2013-07-05 MED ORDER — ROCURONIUM BROMIDE 100 MG/10ML IV SOLN
INTRAVENOUS | Status: DC | PRN
  Administered 2013-07-05: 50 mg via INTRAVENOUS

## 2013-07-05 MED ORDER — PROPOFOL 10 MG/ML IV BOLUS
INTRAVENOUS | Status: DC | PRN
  Administered 2013-07-05: 100 mg via INTRAVENOUS

## 2013-07-05 MED ORDER — LACTATED RINGERS IV SOLN
INTRAVENOUS | Status: DC | PRN
  Administered 2013-07-05 (×2): via INTRAVENOUS

## 2013-07-05 MED ORDER — ACETAMINOPHEN 325 MG PO TABS: 650.0000 mg | ORAL_TABLET | ORAL | Status: DC | PRN

## 2013-07-05 MED ORDER — MOMETASONE FURO-FORMOTEROL FUM 100-5 MCG/ACT IN AERO
2.0000 | INHALATION_SPRAY | Freq: Two times a day (BID) | RESPIRATORY_TRACT | Status: DC
  Filled 2013-07-05: qty 8.8

## 2013-07-05 MED ORDER — PANTOPRAZOLE SODIUM 40 MG PO TBEC
40.0000 mg | DELAYED_RELEASE_TABLET | Freq: Every day | ORAL | Status: DC
  Administered 2013-07-06: 40 mg via ORAL
  Filled 2013-07-05: qty 1

## 2013-07-05 MED ORDER — HEMOSTATIC AGENTS (NO CHARGE) OPTIME
TOPICAL | Status: DC | PRN
  Administered 2013-07-05: 1 via TOPICAL

## 2013-07-05 MED ORDER — ALBUTEROL SULFATE HFA 108 (90 BASE) MCG/ACT IN AERS
2.0000 | INHALATION_SPRAY | Freq: Four times a day (QID) | RESPIRATORY_TRACT | Status: DC | PRN
Start: 2013-07-05 — End: 2013-07-06
  Filled 2013-07-05: qty 6.7

## 2013-07-05 MED ORDER — ACETAMINOPHEN 650 MG RE SUPP: 650.0000 mg | RECTAL | Status: DC | PRN

## 2013-07-05 MED ORDER — BACITRACIN 50000 UNITS IM SOLR
INTRAMUSCULAR | Status: AC
  Filled 2013-07-05: qty 1

## 2013-07-05 MED ORDER — SENNA 8.6 MG PO TABS
1.0000 | ORAL_TABLET | Freq: Two times a day (BID) | ORAL | Status: DC
  Administered 2013-07-05: 8.6 mg via ORAL
  Filled 2013-07-05 (×3): qty 1

## 2013-07-05 MED ORDER — ALUM & MAG HYDROXIDE-SIMETH 200-200-20 MG/5ML PO SUSP: 30.0000 mL | Freq: Four times a day (QID) | ORAL | Status: DC | PRN

## 2013-07-05 MED ORDER — HYDROMORPHONE HCL PF 1 MG/ML IJ SOLN: 0.5000 mg | INTRAMUSCULAR | Status: DC | PRN

## 2013-07-05 MED ORDER — ONDANSETRON HCL 4 MG/2ML IJ SOLN: 4.0000 mg | INTRAMUSCULAR | Status: DC | PRN

## 2013-07-05 MED ORDER — MENTHOL 3 MG MT LOZG
1.0000 | LOZENGE | OROMUCOSAL | Status: DC | PRN
  Administered 2013-07-05: 3 mg via ORAL
  Filled 2013-07-05: qty 9

## 2013-07-05 MED ORDER — SODIUM CHLORIDE 0.9 % IV SOLN
INTRAVENOUS | Status: AC
  Filled 2013-07-05: qty 500

## 2013-07-05 MED ORDER — DEXAMETHASONE SODIUM PHOSPHATE 10 MG/ML IJ SOLN
10.0000 mg | INTRAMUSCULAR | Status: DC
  Filled 2013-07-05: qty 1

## 2013-07-05 MED ORDER — SODIUM CHLORIDE 0.9 % IJ SOLN
3.0000 mL | Freq: Two times a day (BID) | INTRAMUSCULAR | Status: DC
  Administered 2013-07-05 (×2): 3 mL via INTRAVENOUS

## 2013-07-05 MED ORDER — FLUTICASONE PROPIONATE 50 MCG/ACT NA SUSP
2.0000 | Freq: Every day | NASAL | Status: DC
  Filled 2013-07-05: qty 16

## 2013-07-05 MED ORDER — THROMBIN 5000 UNITS EX SOLR
CUTANEOUS | Status: DC | PRN
  Administered 2013-07-05 (×2): 5000 [IU] via TOPICAL

## 2013-07-05 MED ORDER — OXYCODONE-ACETAMINOPHEN 5-325 MG PO TABS: 1.0000 | ORAL_TABLET | ORAL | Status: DC | PRN

## 2013-07-05 MED ORDER — 0.9 % SODIUM CHLORIDE (POUR BTL) OPTIME
TOPICAL | Status: DC | PRN
  Administered 2013-07-05: 1000 mL

## 2013-07-05 MED ORDER — GLYCOPYRROLATE 0.2 MG/ML IJ SOLN
INTRAMUSCULAR | Status: DC | PRN
  Administered 2013-07-05: .8 mg via INTRAVENOUS

## 2013-07-05 MED ORDER — FENTANYL CITRATE 0.05 MG/ML IJ SOLN
INTRAMUSCULAR | Status: DC | PRN
  Administered 2013-07-05: 200 ug via INTRAVENOUS
  Administered 2013-07-05: 50 ug via INTRAVENOUS

## 2013-07-05 SURGICAL SUPPLY — 53 items
BAG DECANTER FOR FLEXI CONT (MISCELLANEOUS) ×2 IMPLANT
BENZOIN TINCTURE PRP APPL 2/3 (GAUZE/BANDAGES/DRESSINGS) ×2 IMPLANT
BLADE SURG ROTATE 9660 (MISCELLANEOUS) IMPLANT
BRUSH SCRUB EZ PLAIN DRY (MISCELLANEOUS) ×2 IMPLANT
BUR CUTTER 7.0 ROUND (BURR) ×2 IMPLANT
CANISTER SUCTION 2500CC (MISCELLANEOUS) ×2 IMPLANT
CLOTH BEACON ORANGE TIMEOUT ST (SAFETY) ×2 IMPLANT
CONT SPEC 4OZ CLIKSEAL STRL BL (MISCELLANEOUS) ×2 IMPLANT
DECANTER SPIKE VIAL GLASS SM (MISCELLANEOUS) ×2 IMPLANT
DERMABOND ADVANCED (GAUZE/BANDAGES/DRESSINGS)
DERMABOND ADVANCED .7 DNX12 (GAUZE/BANDAGES/DRESSINGS) IMPLANT
DRAPE LAPAROTOMY 100X72X124 (DRAPES) ×2 IMPLANT
DRAPE MICROSCOPE LEICA (MISCELLANEOUS) ×2 IMPLANT
DRAPE MICROSCOPE ZEISS OPMI (DRAPES) IMPLANT
DRAPE POUCH INSTRU U-SHP 10X18 (DRAPES) ×2 IMPLANT
DRAPE PROXIMA HALF (DRAPES) IMPLANT
DRAPE SURG 17X23 STRL (DRAPES) ×4 IMPLANT
DURAPREP 26ML APPLICATOR (WOUND CARE) ×2 IMPLANT
ELECT REM PT RETURN 9FT ADLT (ELECTROSURGICAL) ×2
ELECTRODE REM PT RTRN 9FT ADLT (ELECTROSURGICAL) ×1 IMPLANT
GAUZE SPONGE 4X4 16PLY XRAY LF (GAUZE/BANDAGES/DRESSINGS) IMPLANT
GLOVE BIO SURGEON STRL SZ8.5 (GLOVE) ×2 IMPLANT
GLOVE BIOGEL PI IND STRL 7.0 (GLOVE) ×3 IMPLANT
GLOVE BIOGEL PI INDICATOR 7.0 (GLOVE) ×3
GLOVE ECLIPSE 8.5 STRL (GLOVE) ×2 IMPLANT
GLOVE EXAM NITRILE LRG STRL (GLOVE) IMPLANT
GLOVE EXAM NITRILE MD LF STRL (GLOVE) IMPLANT
GLOVE EXAM NITRILE XL STR (GLOVE) IMPLANT
GLOVE EXAM NITRILE XS STR PU (GLOVE) IMPLANT
GLOVE OPTIFIT SS 6.5 STRL BRWN (GLOVE) ×4 IMPLANT
GLOVE OPTIFIT SS 8.0 STRL (GLOVE) ×2 IMPLANT
GOWN BRE IMP SLV AUR LG STRL (GOWN DISPOSABLE) IMPLANT
GOWN BRE IMP SLV AUR XL STRL (GOWN DISPOSABLE) IMPLANT
GOWN STRL REIN 2XL LVL4 (GOWN DISPOSABLE) IMPLANT
KIT BASIN OR (CUSTOM PROCEDURE TRAY) ×2 IMPLANT
KIT ROOM TURNOVER OR (KITS) ×2 IMPLANT
NEEDLE HYPO 22GX1.5 SAFETY (NEEDLE) ×2 IMPLANT
NEEDLE SPNL 22GX3.5 QUINCKE BK (NEEDLE) ×2 IMPLANT
NS IRRIG 1000ML POUR BTL (IV SOLUTION) ×2 IMPLANT
PACK LAMINECTOMY NEURO (CUSTOM PROCEDURE TRAY) ×2 IMPLANT
PAD ARMBOARD 7.5X6 YLW CONV (MISCELLANEOUS) ×10 IMPLANT
RUBBERBAND STERILE (MISCELLANEOUS) ×4 IMPLANT
SPONGE GAUZE 4X4 12PLY (GAUZE/BANDAGES/DRESSINGS) ×2 IMPLANT
SPONGE SURGIFOAM ABS GEL SZ50 (HEMOSTASIS) ×2 IMPLANT
STRIP CLOSURE SKIN 1/2X4 (GAUZE/BANDAGES/DRESSINGS) ×2 IMPLANT
SUT PROLENE 6 0 BV (SUTURE) ×2 IMPLANT
SUT VIC AB 2-0 CT1 18 (SUTURE) ×2 IMPLANT
SUT VIC AB 3-0 SH 8-18 (SUTURE) ×2 IMPLANT
SYR 20ML ECCENTRIC (SYRINGE) ×2 IMPLANT
TAPE CLOTH SURG 4X10 WHT LF (GAUZE/BANDAGES/DRESSINGS) ×2 IMPLANT
TOWEL OR 17X24 6PK STRL BLUE (TOWEL DISPOSABLE) ×2 IMPLANT
TOWEL OR 17X26 10 PK STRL BLUE (TOWEL DISPOSABLE) ×2 IMPLANT
WATER STERILE IRR 1000ML POUR (IV SOLUTION) ×2 IMPLANT

## 2013-07-05 NOTE — Anesthesia Procedure Notes (Signed)
Procedure Name: Intubation Date/Time: 07/05/2013 10:53 AM Performed by: Sherie Don Pre-anesthesia Checklist: Patient identified, Emergency Drugs available, Suction available, Patient being monitored and Timeout performed Patient Re-evaluated:Patient Re-evaluated prior to inductionOxygen Delivery Method: Circle system utilized Preoxygenation: Pre-oxygenation with 100% oxygen Intubation Type: IV induction Ventilation: Mask ventilation without difficulty and Oral airway inserted - appropriate to patient size Laryngoscope Size: Mac and 3 Grade View: Grade I Tube type: Oral Tube size: 7.5 mm Number of attempts: 1 Airway Equipment and Method: Stylet Placement Confirmation: ETT inserted through vocal cords under direct vision,  positive ETCO2 and breath sounds checked- equal and bilateral Secured at: 23 cm Tube secured with: Tape Dental Injury: Teeth and Oropharynx as per pre-operative assessment

## 2013-07-05 NOTE — Plan of Care (Signed)
Problem: Consults Goal: Diagnosis - Spinal Surgery Outcome: Completed/Met Date Met:  07/05/13 Microdiscectomy     

## 2013-07-05 NOTE — Preoperative (Signed)
Beta Blockers   Reason not to administer Beta Blockers:Not Applicable 

## 2013-07-05 NOTE — Op Note (Signed)
Date of procedure: 07/05/2013  Date of dictation: Same  Service: Neurosurgery  Preoperative diagnosis: Left L5-S1 herniated pulposus with radiculopathy  Postoperative diagnosis: Same  Procedure Name: Left L5-S1 laminotomy and microdiscectomy  Surgeon:Reia Viernes A.Anand Tejada, M.D.  Asst. Surgeon: Lovell Sheehan  Anesthesia: General  Indication: 79 year old female with a remote history of L3-4 and L4-5 fusion presents now with back and left leg pain consistent with a left-sided S1 radiculopathy. MRI scan demonstrates a rather large left-sided L5-S1 disc herniation with marked compression of the left-sided S1 nerve root and thecal sac. Patient presents now for laminotomy and microdiscectomy in hopes of improving her symptoms.  Operative note: After induction of anesthesia, patient positioned prone onto Wilson frame and her purply padded. Patient's lumbar region prepped and draped. Incision made overlying the L5-S1 interspace. This carried down sharply in the midline. Subperiosteal section of performed so the lamina and facet joints of L5 and S1 on the left side. Deep self-retaining traction placed intraoperative x-ray taken level confirmed. Laminotomy then performed using high-speed drill and Kerrison rongeurs to median tear aspect of lamina L5 medial aspect the L5-S1 facet joint and the superior rim of the S1 lamina. Ligament flavum was elevated and resected piecemeal fashion using Kerrison rongeurs. Underlying thecal sac and S1 nerve or were notified. Michael 2 (MAC dissection of the left-sided S1 nerve root and underlying disc range. Epidermides pus was quite limited and cut. Thecal sac and S1 nerve root gently mobilized track towards midline. Disc herniation was apparent. This is an incised 15 blade in a rectangular fashion. Y. disc space cleanout was achieved using pituitary rongeurs up-biting pituitary rongeurs and Epstein curettes. All elements of the disc herniation were completely resected. All loose are  obvious he jammed his peers and interspace. At this point a very thorough discectomy been achieved. There is no evidence of injury to thecal sac and nerve roots. Wounds an area MI solution. Gelfoam was placed topically for hemostasis which Ascent be good. Microscope and retractor system were removed. Hemostasis of the muscle was achieved with cautery. Wounds and close in layers with Vicryl sutures. Steri-Strips and sterile dressing were applied. There were no apparent complications. Patient tolerated the procedure well and she returns to the recovery room postop.   o

## 2013-07-05 NOTE — Anesthesia Preprocedure Evaluation (Addendum)
Anesthesia Evaluation  Patient identified by MRN, date of birth, ID band Patient awake    Reviewed: Allergy & Precautions, H&P , NPO status , Patient's Chart, lab work & pertinent test results  History of Anesthesia Complications Negative for: history of anesthetic complications  Airway Mallampati: I TM Distance: >3 FB Neck ROM: Full    Dental  (+) Edentulous Upper and Edentulous Lower   Pulmonary asthma (inhaler rarely, advair daily) ,  breath sounds clear to auscultation  Pulmonary exam normal       Cardiovascular negative cardio ROS  Rhythm:Regular Rate:Normal     Neuro/Psych    GI/Hepatic Neg liver ROS, GERD-  Medicated and Controlled,  Endo/Other  negative endocrine ROS  Renal/GU negative Renal ROS     Musculoskeletal   Abdominal   Peds  Hematology  (+) Blood dyscrasia (Hb 10.1), anemia ,   Anesthesia Other Findings   Reproductive/Obstetrics                          Anesthesia Physical Anesthesia Plan  ASA: II  Anesthesia Plan:    Post-op Pain Management:    Induction: Intravenous  Airway Management Planned: Oral ETT  Additional Equipment:   Intra-op Plan:   Post-operative Plan: Extubation in OR  Informed Consent: I have reviewed the patients History and Physical, chart, labs and discussed the procedure including the risks, benefits and alternatives for the proposed anesthesia with the patient or authorized representative who has indicated his/her understanding and acceptance.     Plan Discussed with: Surgeon and CRNA  Anesthesia Plan Comments: (Plan routine monitors, GETA)        Anesthesia Quick Evaluation

## 2013-07-05 NOTE — Anesthesia Postprocedure Evaluation (Signed)
  Anesthesia Post-op Note  Patient: Beverly Jimenez  Procedure(s) Performed: Procedure(s) with comments: LUMBAR LAMINECTOMY/DECOMPRESSION MICRODISCECTOMY 1 LEVEL Left Lumbar five-Sacral one (Left) - Left Lumbar Five-Sacral One Microdiskectomy  Patient Location: PACU  Anesthesia Type:General  Level of Consciousness: awake, alert , oriented and patient cooperative  Airway and Oxygen Therapy: Patient Spontanous Breathing and Patient connected to nasal cannula oxygen  Post-op Pain: none  Post-op Assessment: Post-op Vital signs reviewed, Patient's Cardiovascular Status Stable, Respiratory Function Stable, Patent Airway, No signs of Nausea or vomiting and Pain level controlled  Post-op Vital Signs: Reviewed and stable  Complications: No apparent anesthesia complications

## 2013-07-05 NOTE — Consult Note (Signed)
ANTIBIOTIC CONSULT NOTE - INITIAL  Pharmacy Consult for : Vancomycin Indication: Post op antibiotic prophylaxsis s/p Lumbar laminectomy/decompression  Allergies Macrodantin, Amoxicillin, Sulfa antibiotics  Dosing Wt:  75.5 kg  Medical History: Past Medical History  Diagnosis Date  . Shortness of breath   . GERD (gastroesophageal reflux disease)   . High cholesterol   . Arthritis     shoulder and knee pain  . Anemia   . Osteoporosis   . Asthma   . Myalgia   . Glaucoma     Hx; of  . Depression     Hx; of situational depression  . Pneumonia     Hx: of "years ago"  . Numbness and tingling     Hx: of B/LLE    Past Surgical History  Procedure Laterality Date  . 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 Back surgery    . Cholecystectomy    . Hernia repair    . Appendectomy    . Eye surgery      catarac  . Vascular surgery      varicose vein surgery  . Colonoscopy      Hx: of    Medications: . aspirin EC  81 mg Oral Daily  . bacitracin      . bimatoprost  1 drop Both Eyes QHS  . fluticasone  2 spray Each Nare QHS  . mometasone-formoterol  2 puff Inhalation BID  . pantoprazole  40 mg Oral Daily  . senna  1 tablet Oral BID  . sodium chloride      . sodium chloride  3 mL Intravenous Q12H     Assessment:  79 y/o female s/p Lumbar laminectomy/decompression who is to receive post-op Vancomycin x 1 if there is no drain.  No drain was placed.  Last Scr 1 mg%.  No adjustment in dose required.  Plan:   Vancomycin 1 gm 12 hours after end of surgery x 1, then discontinue.  Pharmacy will sign off at this time, please call for further questions  Laurena Bering,  Pharm.D.  07/05/2013 2:28 PM

## 2013-07-05 NOTE — Brief Op Note (Signed)
07/05/2013  12:10 PM  PATIENT:  Tawny Asal  79 y.o. female  PRE-OPERATIVE DIAGNOSIS:  HNP  POST-OPERATIVE DIAGNOSIS:  HNP  PROCEDURE:  Procedure(s) with comments: LUMBAR LAMINECTOMY/DECOMPRESSION MICRODISCECTOMY 1 LEVEL Left Lumbar five-Sacral one (Left) - Left Lumbar Five-Sacral One Microdiskectomy  SURGEON:  Surgeon(s) and Role:    * Temple Pacini, MD - Primary    * Cristi Loron, MD - Assisting  PHYSICIAN ASSISTANT:   ASSISTANTS:    ANESTHESIA:   general  EBL:  Total I/O In: 1000 [I.V.:1000] Out: 50 [Blood:50]  BLOOD ADMINISTERED:none  DRAINS: none   LOCAL MEDICATIONS USED:  MARCAINE     SPECIMEN:  No Specimen  DISPOSITION OF SPECIMEN:  N/A  COUNTS:  YES  TOURNIQUET:  * No tourniquets in log *  DICTATION: .Dragon Dictation  PLAN OF CARE: Admit to inpatient   PATIENT DISPOSITION:  PACU - hemodynamically stable.   Delay start of Pharmacological VTE agent (>24hrs) due to surgical blood loss or risk of bleeding: yes

## 2013-07-05 NOTE — H&P (Signed)
Beverly Jimenez is an 79 y.o. female.   Chief Complaint: Left leg pain HPI: 79 year old female status post previous L3-4 and L4-5 decompression and fusion with good results. Patient presents now with left leg pain and some lower back pain. Workup demonstrates evidence of a large left-sided L5-S1 paracentral disc herniation. Patient has failed conservative management and presents now for microdiscectomy.  Past Medical History  Diagnosis Date  . Shortness of breath   . GERD (gastroesophageal reflux disease)   . High cholesterol   . Arthritis     shoulder and knee pain  . Anemia   . Osteoporosis   . Asthma   . Myalgia   . Glaucoma     Hx; of  . Depression     Hx; of situational depression  . Pneumonia     Hx: of "years ago"  . Numbness and tingling     Hx: of B/LLE    Past Surgical History  Procedure Laterality Date  . 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 Back surgery    . Cholecystectomy    . Hernia repair    . Appendectomy    . Eye surgery      catarac  . Vascular surgery      varicose vein surgery  . Colonoscopy      Hx: of    Family History  Problem Relation Age of Onset  . Hypertension Other   . Heart disease Other   . Glaucoma Other   . Diabetes Other    Social History:  reports that she has never smoked. She has never used smokeless tobacco. She reports that she does not drink alcohol or use illicit drugs.  Allergies:  Allergies  Allergen Reactions  . Macrodantin (Nitrofurantoin Macrocrystal) Shortness Of Breath  . Amoxicillin Rash  . Sulfa Antibiotics Rash    Medications Prior to Admission  Medication Sig Dispense Refill  . albuterol (PROVENTIL HFA;VENTOLIN HFA) 108 (90 BASE) MCG/ACT inhaler Inhale 2 puffs into the lungs every 6 (six) hours as needed for wheezing or shortness of breath.       Marland Kitchen aspirin EC 81 MG tablet Take 81 mg by mouth daily.      . bimatoprost (LUMIGAN) 0.01 % SOLN Place 1 drop into both eyes at bedtime.      . Calcium  Carbonate-Vit D-Min (CALTRATE PLUS PO) Take 1,000 mg by mouth 2 (two) times daily.      . cholecalciferol (VITAMIN D) 1000 UNITS tablet Take 1,000 Units by mouth daily.      . Cyanocobalamin (VITAMIN B-12 PO) Place 1 tablet under the tongue daily.      Marland Kitchen esomeprazole (NEXIUM) 40 MG capsule Take 40 mg by mouth daily before breakfast.      . fish oil-omega-3 fatty acids 1000 MG capsule Take 2 g by mouth 2 (two) times daily.       . fluticasone (FLONASE) 50 MCG/ACT nasal spray Place 2 sprays into the nose at bedtime.       . Fluticasone-Salmeterol (ADVAIR) 250-50 MCG/DOSE AEPB Inhale 1 puff into the lungs every 12 (twelve) hours.      . naproxen (NAPROSYN) 500 MG tablet Take 500 mg by mouth 2 (two) times daily as needed (pain).      . niacin 500 MG tablet Take 500 mg by mouth daily with breakfast.      . Potassium 99 MG TABS Take 99 mg by mouth daily.      Marland Kitchen  Red Yeast Rice Extract (RED YEAST RICE PO) Take 2 capsules by mouth at bedtime.      . vitamin C (ASCORBIC ACID) 500 MG tablet Take 500 mg by mouth daily.      . vitamin E 400 UNIT capsule Take 400 Units by mouth daily.        No results found for this or any previous visit (from the past 48 hour(s)). No results found.  Review of Systems  Constitutional: Negative.   HENT: Negative.   Eyes: Negative.   Respiratory: Negative.   Cardiovascular: Negative.   Gastrointestinal: Negative.   Genitourinary: Negative.   Musculoskeletal: Negative.   Skin: Negative.   Neurological: Negative.   Endo/Heme/Allergies: Negative.   Psychiatric/Behavioral: Negative.     Blood pressure 135/69, pulse 72, temperature 97.6 F (36.4 C), temperature source Oral, resp. rate 18, SpO2 100.00%. Physical Exam  Constitutional: She is oriented to person, place, and time. She appears well-developed and well-nourished. No distress.  HENT:  Head: Normocephalic and atraumatic.  Right Ear: External ear normal.  Left Ear: External ear normal.  Nose: Nose normal.   Mouth/Throat: Oropharynx is clear and moist. No oropharyngeal exudate.  Eyes: Conjunctivae and EOM are normal. Pupils are equal, round, and reactive to light. Right eye exhibits no discharge. Left eye exhibits no discharge.  Neck: Normal range of motion. Neck supple.  Cardiovascular: Normal rate, regular rhythm, normal heart sounds and intact distal pulses.   No murmur heard. Respiratory: Effort normal and breath sounds normal. No respiratory distress. She has no wheezes.  GI: Soft. Bowel sounds are normal. She exhibits no distension. There is no tenderness.  Musculoskeletal: Normal range of motion. She exhibits no edema and no tenderness.  Neurological: She is alert and oriented to person, place, and time. She has normal reflexes. She displays normal reflexes. No cranial nerve deficit. She exhibits normal muscle tone. Coordination normal.  Positive left straight leg raise. Decreased sensation left S1.  Skin: Skin is warm and dry. No rash noted. She is not diaphoretic. No erythema. No pallor.  Psychiatric: She has a normal mood and affect. Her behavior is normal. Judgment and thought content normal.     Assessment/Plan Left L5-S1 herniated nuclear pulposus with radiculopathy. Plan left L5-S1 laminotomy and microdiscectomy. Risks and benefits explained. Patient wishes to proceed. Seleni Meller A 07/05/2013, 9:29 AM

## 2013-07-05 NOTE — Transfer of Care (Signed)
Immediate Anesthesia Transfer of Care Note  Patient: Beverly Jimenez  Procedure(s) Performed: Procedure(s) with comments: LUMBAR LAMINECTOMY/DECOMPRESSION MICRODISCECTOMY 1 LEVEL Left Lumbar five-Sacral one (Left) - Left Lumbar Five-Sacral One Microdiskectomy  Patient Location: PACU  Anesthesia Type:General  Level of Consciousness: awake, oriented and patient cooperative  Airway & Oxygen Therapy: Patient Spontanous Breathing and Patient connected to nasal cannula oxygen  Post-op Assessment: Report given to PACU RN and Post -op Vital signs reviewed and stable  Post vital signs: Reviewed and stable  Complications: No apparent anesthesia complications

## 2013-07-06 MED ORDER — CYCLOBENZAPRINE HCL 10 MG PO TABS: 5.0000 mg | ORAL_TABLET | Freq: Three times a day (TID) | ORAL | Status: DC | PRN

## 2013-07-06 MED ORDER — HYDROCODONE-ACETAMINOPHEN 5-325 MG PO TABS: 1.0000 | ORAL_TABLET | ORAL | Status: DC | PRN

## 2013-07-06 NOTE — Discharge Summary (Signed)
Physician Discharge Summary  Patient ID: Beverly Jimenez MRN: [id number] DOB/AGE: 1925/05/04 79 y.o.  Admit date: 07/05/2013 Discharge date: 07/06/2013  Admission Diagnoses: Left L5-S1 herniated disc, lumbago, lumbar radiculopathy  Discharge Diagnoses: The same Principal Problem:   HNP (herniated nucleus pulposus), lumbar   Discharged Condition: good  Hospital Course: Dr. Dutch Quint performed a left L5-S1 discectomy on the patient on at 114.  The patient's postoperative course was unremarkable. On postop day #1 the patient looked and felt well. She requested discharge to home. She was given oral and written discharge instructions. All her questions were answered.  Consults: None Significant Diagnostic Studies: None Treatments: Left L5-S1 discectomy using microdissection Discharge Exam: Blood pressure 125/70, pulse 67, temperature 97.8 F (36.6 C), temperature source Oral, resp. rate 18, SpO2 96.00%. Patient is alert and pleasant. She looks well. Her strength is grossly normal.  Disposition: Home  Discharge Orders   Future Orders Complete By Expires     Call MD for:  difficulty breathing, headache or visual disturbances  As directed     Call MD for:  extreme fatigue  As directed     Call MD for:  hives  As directed     Call MD for:  persistant dizziness or light-headedness  As directed     Call MD for:  persistant nausea and vomiting  As directed     Call MD for:  redness, tenderness, or signs of infection (pain, swelling, redness, odor or green/yellow discharge around incision site)  As directed     Call MD for:  severe uncontrolled pain  As directed     Call MD for:  temperature >100.4  As directed     Diet - low sodium heart healthy  As directed     Discharge instructions  As directed     Comments:      Call [phone] for a followup appointment. Take a stool softener while you are using pain medications.    Driving Restrictions  As directed     Comments:      Do not drive  for 2 weeks.    Increase activity slowly  As directed     Lifting restrictions  As directed     Comments:      Do not lift more than 5 pounds. No excessive bending or twisting.    May shower / Bathe  As directed     Comments:      He may shower after the pain she is removed 3 days after surgery. Leave the incision alone.    Remove dressing in 48 hours  As directed     Comments:      Your stitches are under the scan and will dissolve by themselves. The Steri-Strips will fall off after you take a few showers. Do not rub back or pick at the wound, Leave the wound alone.        Medication List         albuterol 108 (90 BASE) MCG/ACT inhaler  Commonly known as:  PROVENTIL HFA;VENTOLIN HFA  Inhale 2 puffs into the lungs every 6 (six) hours as needed for wheezing or shortness of breath.     aspirin EC 81 MG tablet  Take 81 mg by mouth daily.     bimatoprost 0.01 % Soln  Commonly known as:  LUMIGAN  Place 1 drop into both eyes at bedtime.     CALTRATE PLUS PO  Take 1,000 mg by mouth 2 (two) times daily.  cholecalciferol 1000 UNITS tablet  Commonly known as:  VITAMIN D  Take 1,000 Units by mouth daily.     cyclobenzaprine 10 MG tablet  Commonly known as:  FLEXERIL  Take 0.5 tablets (5 mg total) by mouth 3 (three) times daily as needed for muscle spasms.     esomeprazole 40 MG capsule  Commonly known as:  NEXIUM  Take 40 mg by mouth daily before breakfast.     fish oil-omega-3 fatty acids 1000 MG capsule  Take 2 g by mouth 2 (two) times daily.     fluticasone 50 MCG/ACT nasal spray  Commonly known as:  FLONASE  Place 2 sprays into the nose at bedtime.     Fluticasone-Salmeterol 250-50 MCG/DOSE Aepb  Commonly known as:  ADVAIR  Inhale 1 puff into the lungs every 12 (twelve) hours.     HYDROcodone-acetaminophen 5-325 MG per tablet  Commonly known as:  NORCO/VICODIN  Take 1-2 tablets by mouth every 4 (four) hours as needed.     naproxen 500 MG tablet  Commonly known  as:  NAPROSYN  Take 500 mg by mouth 2 (two) times daily as needed (pain).     niacin 500 MG tablet  Take 500 mg by mouth daily with breakfast.     Potassium 99 MG Tabs  Take 99 mg by mouth daily.     RED YEAST RICE PO  Take 2 capsules by mouth at bedtime.     VITAMIN B-12 PO  Place 1 tablet under the tongue daily.     vitamin C 500 MG tablet  Commonly known as:  ASCORBIC ACID  Take 500 mg by mouth daily.     vitamin E 400 UNIT capsule  Take 400 Units by mouth daily.         SignedCristi Loron 07/06/2013, 9:19 AM

## 2013-07-06 NOTE — Progress Notes (Signed)
Pt. discharged home accompanied by family. Prescriptions and discharge instructions given with verbalization of understanding. Incision site on back with no s/s of infection - no swelling, redness, bleeding, and/or drainage noted. Opportunity given to ask questions but no question asked. Patient transported out of the unit by the nurse tech. Aisha RN

## 2013-07-09 ENCOUNTER — Encounter (HOSPITAL_COMMUNITY): Payer: Self-pay | Admitting: Neurosurgery

## 2013-07-10 ENCOUNTER — Other Ambulatory Visit: Payer: Self-pay

## 2013-09-04 DIAGNOSIS — J45909 Unspecified asthma, uncomplicated: Secondary | ICD-10-CM | POA: Diagnosis not present

## 2013-09-04 DIAGNOSIS — D649 Anemia, unspecified: Secondary | ICD-10-CM | POA: Diagnosis not present

## 2013-09-04 DIAGNOSIS — R7301 Impaired fasting glucose: Secondary | ICD-10-CM | POA: Diagnosis not present

## 2013-09-04 DIAGNOSIS — M810 Age-related osteoporosis without current pathological fracture: Secondary | ICD-10-CM | POA: Diagnosis not present

## 2013-09-04 DIAGNOSIS — Z6828 Body mass index (BMI) 28.0-28.9, adult: Secondary | ICD-10-CM | POA: Diagnosis not present

## 2013-09-04 DIAGNOSIS — E7800 Pure hypercholesterolemia, unspecified: Secondary | ICD-10-CM | POA: Diagnosis not present

## 2013-09-04 DIAGNOSIS — Z23 Encounter for immunization: Secondary | ICD-10-CM | POA: Diagnosis not present

## 2013-09-05 DIAGNOSIS — H43399 Other vitreous opacities, unspecified eye: Secondary | ICD-10-CM | POA: Diagnosis not present

## 2013-09-05 DIAGNOSIS — H26499 Other secondary cataract, unspecified eye: Secondary | ICD-10-CM | POA: Diagnosis not present

## 2013-09-05 DIAGNOSIS — H4011X0 Primary open-angle glaucoma, stage unspecified: Secondary | ICD-10-CM | POA: Diagnosis not present

## 2013-09-24 DIAGNOSIS — J310 Chronic rhinitis: Secondary | ICD-10-CM | POA: Diagnosis not present

## 2013-09-24 DIAGNOSIS — H905 Unspecified sensorineural hearing loss: Secondary | ICD-10-CM | POA: Diagnosis not present

## 2013-09-24 DIAGNOSIS — H6120 Impacted cerumen, unspecified ear: Secondary | ICD-10-CM | POA: Diagnosis not present

## 2013-10-10 ENCOUNTER — Other Ambulatory Visit: Payer: Self-pay

## 2013-10-30 DIAGNOSIS — M810 Age-related osteoporosis without current pathological fracture: Secondary | ICD-10-CM | POA: Diagnosis not present

## 2013-10-30 DIAGNOSIS — Z79899 Other long term (current) drug therapy: Secondary | ICD-10-CM | POA: Diagnosis not present

## 2013-11-08 DIAGNOSIS — K219 Gastro-esophageal reflux disease without esophagitis: Secondary | ICD-10-CM | POA: Diagnosis not present

## 2013-11-22 ENCOUNTER — Other Ambulatory Visit (HOSPITAL_COMMUNITY): Payer: Self-pay | Admitting: Internal Medicine

## 2013-11-22 ENCOUNTER — Ambulatory Visit (HOSPITAL_COMMUNITY)
Admission: RE | Admit: 2013-11-22 | Discharge: 2013-11-22 | Disposition: A | Discharge status: Home / Self Care | Payer: Medicare Other | Source: Ambulatory Visit | Attending: Internal Medicine | Admitting: Internal Medicine

## 2013-11-22 ENCOUNTER — Encounter (HOSPITAL_COMMUNITY): Payer: Self-pay

## 2013-11-22 DIAGNOSIS — M810 Age-related osteoporosis without current pathological fracture: Secondary | ICD-10-CM | POA: Insufficient documentation

## 2013-11-22 MED ORDER — SODIUM CHLORIDE 0.9 % IV SOLN
Freq: Once | INTRAVENOUS | Status: AC
  Administered 2013-11-22: 15:00:00 via INTRAVENOUS

## 2013-11-22 MED ORDER — ZOLEDRONIC ACID 5 MG/100ML IV SOLN
5.0000 mg | Freq: Once | INTRAVENOUS | Status: AC
  Administered 2013-11-22: 5 mg via INTRAVENOUS
  Filled 2013-11-22: qty 100

## 2013-11-26 ENCOUNTER — Encounter (HOSPITAL_COMMUNITY): Payer: Medicare Other

## 2013-12-04 DIAGNOSIS — E669 Obesity, unspecified: Secondary | ICD-10-CM | POA: Diagnosis not present

## 2013-12-04 DIAGNOSIS — M5126 Other intervertebral disc displacement, lumbar region: Secondary | ICD-10-CM | POA: Diagnosis not present

## 2013-12-18 DIAGNOSIS — R293 Abnormal posture: Secondary | ICD-10-CM | POA: Diagnosis not present

## 2013-12-18 DIAGNOSIS — R262 Difficulty in walking, not elsewhere classified: Secondary | ICD-10-CM | POA: Diagnosis not present

## 2013-12-18 DIAGNOSIS — M6281 Muscle weakness (generalized): Secondary | ICD-10-CM | POA: Diagnosis not present

## 2013-12-18 DIAGNOSIS — M545 Low back pain, unspecified: Secondary | ICD-10-CM | POA: Diagnosis not present

## 2013-12-20 DIAGNOSIS — R293 Abnormal posture: Secondary | ICD-10-CM | POA: Diagnosis not present

## 2013-12-20 DIAGNOSIS — M6281 Muscle weakness (generalized): Secondary | ICD-10-CM | POA: Diagnosis not present

## 2013-12-20 DIAGNOSIS — M545 Low back pain, unspecified: Secondary | ICD-10-CM | POA: Diagnosis not present

## 2013-12-20 DIAGNOSIS — R262 Difficulty in walking, not elsewhere classified: Secondary | ICD-10-CM | POA: Diagnosis not present

## 2013-12-23 DIAGNOSIS — M545 Low back pain, unspecified: Secondary | ICD-10-CM | POA: Diagnosis not present

## 2013-12-23 DIAGNOSIS — M6281 Muscle weakness (generalized): Secondary | ICD-10-CM | POA: Diagnosis not present

## 2013-12-23 DIAGNOSIS — R262 Difficulty in walking, not elsewhere classified: Secondary | ICD-10-CM | POA: Diagnosis not present

## 2013-12-23 DIAGNOSIS — R293 Abnormal posture: Secondary | ICD-10-CM | POA: Diagnosis not present

## 2013-12-25 DIAGNOSIS — R262 Difficulty in walking, not elsewhere classified: Secondary | ICD-10-CM | POA: Diagnosis not present

## 2013-12-25 DIAGNOSIS — M6281 Muscle weakness (generalized): Secondary | ICD-10-CM | POA: Diagnosis not present

## 2013-12-25 DIAGNOSIS — M545 Low back pain, unspecified: Secondary | ICD-10-CM | POA: Diagnosis not present

## 2013-12-25 DIAGNOSIS — R293 Abnormal posture: Secondary | ICD-10-CM | POA: Diagnosis not present

## 2013-12-27 DIAGNOSIS — R262 Difficulty in walking, not elsewhere classified: Secondary | ICD-10-CM | POA: Diagnosis not present

## 2013-12-27 DIAGNOSIS — M545 Low back pain, unspecified: Secondary | ICD-10-CM | POA: Diagnosis not present

## 2013-12-27 DIAGNOSIS — R293 Abnormal posture: Secondary | ICD-10-CM | POA: Diagnosis not present

## 2013-12-27 DIAGNOSIS — M6281 Muscle weakness (generalized): Secondary | ICD-10-CM | POA: Diagnosis not present

## 2013-12-30 DIAGNOSIS — IMO0002 Reserved for concepts with insufficient information to code with codable children: Secondary | ICD-10-CM | POA: Diagnosis not present

## 2013-12-30 DIAGNOSIS — I10 Essential (primary) hypertension: Secondary | ICD-10-CM | POA: Diagnosis not present

## 2013-12-30 DIAGNOSIS — Z6829 Body mass index (BMI) 29.0-29.9, adult: Secondary | ICD-10-CM | POA: Diagnosis not present

## 2013-12-30 DIAGNOSIS — F411 Generalized anxiety disorder: Secondary | ICD-10-CM | POA: Diagnosis not present

## 2014-01-01 DIAGNOSIS — R293 Abnormal posture: Secondary | ICD-10-CM | POA: Diagnosis not present

## 2014-01-01 DIAGNOSIS — M545 Low back pain, unspecified: Secondary | ICD-10-CM | POA: Diagnosis not present

## 2014-01-01 DIAGNOSIS — R262 Difficulty in walking, not elsewhere classified: Secondary | ICD-10-CM | POA: Diagnosis not present

## 2014-01-01 DIAGNOSIS — M6281 Muscle weakness (generalized): Secondary | ICD-10-CM | POA: Diagnosis not present

## 2014-01-03 DIAGNOSIS — R293 Abnormal posture: Secondary | ICD-10-CM | POA: Diagnosis not present

## 2014-01-03 DIAGNOSIS — M545 Low back pain, unspecified: Secondary | ICD-10-CM | POA: Diagnosis not present

## 2014-01-03 DIAGNOSIS — R262 Difficulty in walking, not elsewhere classified: Secondary | ICD-10-CM | POA: Diagnosis not present

## 2014-01-03 DIAGNOSIS — M6281 Muscle weakness (generalized): Secondary | ICD-10-CM | POA: Diagnosis not present

## 2014-01-06 DIAGNOSIS — R262 Difficulty in walking, not elsewhere classified: Secondary | ICD-10-CM | POA: Diagnosis not present

## 2014-01-06 DIAGNOSIS — M6281 Muscle weakness (generalized): Secondary | ICD-10-CM | POA: Diagnosis not present

## 2014-01-06 DIAGNOSIS — M545 Low back pain, unspecified: Secondary | ICD-10-CM | POA: Diagnosis not present

## 2014-01-06 DIAGNOSIS — R293 Abnormal posture: Secondary | ICD-10-CM | POA: Diagnosis not present

## 2014-01-08 DIAGNOSIS — R262 Difficulty in walking, not elsewhere classified: Secondary | ICD-10-CM | POA: Diagnosis not present

## 2014-01-08 DIAGNOSIS — R293 Abnormal posture: Secondary | ICD-10-CM | POA: Diagnosis not present

## 2014-01-08 DIAGNOSIS — M545 Low back pain, unspecified: Secondary | ICD-10-CM | POA: Diagnosis not present

## 2014-01-08 DIAGNOSIS — M6281 Muscle weakness (generalized): Secondary | ICD-10-CM | POA: Diagnosis not present

## 2014-01-10 DIAGNOSIS — M545 Low back pain, unspecified: Secondary | ICD-10-CM | POA: Diagnosis not present

## 2014-01-10 DIAGNOSIS — R262 Difficulty in walking, not elsewhere classified: Secondary | ICD-10-CM | POA: Diagnosis not present

## 2014-01-10 DIAGNOSIS — R293 Abnormal posture: Secondary | ICD-10-CM | POA: Diagnosis not present

## 2014-01-10 DIAGNOSIS — M6281 Muscle weakness (generalized): Secondary | ICD-10-CM | POA: Diagnosis not present

## 2014-01-13 DIAGNOSIS — M545 Low back pain, unspecified: Secondary | ICD-10-CM | POA: Diagnosis not present

## 2014-01-13 DIAGNOSIS — R262 Difficulty in walking, not elsewhere classified: Secondary | ICD-10-CM | POA: Diagnosis not present

## 2014-01-13 DIAGNOSIS — R293 Abnormal posture: Secondary | ICD-10-CM | POA: Diagnosis not present

## 2014-01-13 DIAGNOSIS — M6281 Muscle weakness (generalized): Secondary | ICD-10-CM | POA: Diagnosis not present

## 2014-01-15 DIAGNOSIS — M6281 Muscle weakness (generalized): Secondary | ICD-10-CM | POA: Diagnosis not present

## 2014-01-15 DIAGNOSIS — M545 Low back pain, unspecified: Secondary | ICD-10-CM | POA: Diagnosis not present

## 2014-01-15 DIAGNOSIS — R293 Abnormal posture: Secondary | ICD-10-CM | POA: Diagnosis not present

## 2014-01-15 DIAGNOSIS — R262 Difficulty in walking, not elsewhere classified: Secondary | ICD-10-CM | POA: Diagnosis not present

## 2014-01-17 DIAGNOSIS — R293 Abnormal posture: Secondary | ICD-10-CM | POA: Diagnosis not present

## 2014-01-17 DIAGNOSIS — M545 Low back pain, unspecified: Secondary | ICD-10-CM | POA: Diagnosis not present

## 2014-01-17 DIAGNOSIS — M6281 Muscle weakness (generalized): Secondary | ICD-10-CM | POA: Diagnosis not present

## 2014-01-17 DIAGNOSIS — R262 Difficulty in walking, not elsewhere classified: Secondary | ICD-10-CM | POA: Diagnosis not present

## 2014-01-20 DIAGNOSIS — R293 Abnormal posture: Secondary | ICD-10-CM | POA: Diagnosis not present

## 2014-01-20 DIAGNOSIS — M545 Low back pain, unspecified: Secondary | ICD-10-CM | POA: Diagnosis not present

## 2014-01-20 DIAGNOSIS — R262 Difficulty in walking, not elsewhere classified: Secondary | ICD-10-CM | POA: Diagnosis not present

## 2014-01-20 DIAGNOSIS — M6281 Muscle weakness (generalized): Secondary | ICD-10-CM | POA: Diagnosis not present

## 2014-01-22 DIAGNOSIS — R293 Abnormal posture: Secondary | ICD-10-CM | POA: Diagnosis not present

## 2014-01-22 DIAGNOSIS — M6281 Muscle weakness (generalized): Secondary | ICD-10-CM | POA: Diagnosis not present

## 2014-01-22 DIAGNOSIS — M545 Low back pain, unspecified: Secondary | ICD-10-CM | POA: Diagnosis not present

## 2014-01-22 DIAGNOSIS — R262 Difficulty in walking, not elsewhere classified: Secondary | ICD-10-CM | POA: Diagnosis not present

## 2014-01-24 DIAGNOSIS — M545 Low back pain, unspecified: Secondary | ICD-10-CM | POA: Diagnosis not present

## 2014-01-24 DIAGNOSIS — R293 Abnormal posture: Secondary | ICD-10-CM | POA: Diagnosis not present

## 2014-01-24 DIAGNOSIS — R262 Difficulty in walking, not elsewhere classified: Secondary | ICD-10-CM | POA: Diagnosis not present

## 2014-01-24 DIAGNOSIS — M6281 Muscle weakness (generalized): Secondary | ICD-10-CM | POA: Diagnosis not present

## 2014-01-29 DIAGNOSIS — M5126 Other intervertebral disc displacement, lumbar region: Secondary | ICD-10-CM | POA: Diagnosis not present

## 2014-01-29 DIAGNOSIS — E663 Overweight: Secondary | ICD-10-CM | POA: Diagnosis not present

## 2014-02-04 DIAGNOSIS — I10 Essential (primary) hypertension: Secondary | ICD-10-CM | POA: Diagnosis not present

## 2014-02-04 DIAGNOSIS — Z6829 Body mass index (BMI) 29.0-29.9, adult: Secondary | ICD-10-CM | POA: Diagnosis not present

## 2014-02-25 DIAGNOSIS — H6120 Impacted cerumen, unspecified ear: Secondary | ICD-10-CM | POA: Diagnosis not present

## 2014-02-25 DIAGNOSIS — H6240 Otitis externa in other diseases classified elsewhere, unspecified ear: Secondary | ICD-10-CM | POA: Diagnosis not present

## 2014-02-25 DIAGNOSIS — D356 Benign neoplasm of aortic body and other paraganglia: Secondary | ICD-10-CM | POA: Diagnosis not present

## 2014-02-25 DIAGNOSIS — H9190 Unspecified hearing loss, unspecified ear: Secondary | ICD-10-CM | POA: Diagnosis not present

## 2014-02-25 DIAGNOSIS — B369 Superficial mycosis, unspecified: Secondary | ICD-10-CM | POA: Diagnosis not present

## 2014-02-26 DIAGNOSIS — E7800 Pure hypercholesterolemia, unspecified: Secondary | ICD-10-CM | POA: Diagnosis not present

## 2014-02-26 DIAGNOSIS — R7301 Impaired fasting glucose: Secondary | ICD-10-CM | POA: Diagnosis not present

## 2014-02-26 DIAGNOSIS — R82998 Other abnormal findings in urine: Secondary | ICD-10-CM | POA: Diagnosis not present

## 2014-02-26 DIAGNOSIS — M810 Age-related osteoporosis without current pathological fracture: Secondary | ICD-10-CM | POA: Diagnosis not present

## 2014-02-26 DIAGNOSIS — I10 Essential (primary) hypertension: Secondary | ICD-10-CM | POA: Diagnosis not present

## 2014-02-26 DIAGNOSIS — D649 Anemia, unspecified: Secondary | ICD-10-CM | POA: Diagnosis not present

## 2014-03-05 DIAGNOSIS — D649 Anemia, unspecified: Secondary | ICD-10-CM | POA: Diagnosis not present

## 2014-03-05 DIAGNOSIS — M810 Age-related osteoporosis without current pathological fracture: Secondary | ICD-10-CM | POA: Diagnosis not present

## 2014-03-05 DIAGNOSIS — R7301 Impaired fasting glucose: Secondary | ICD-10-CM | POA: Diagnosis not present

## 2014-03-05 DIAGNOSIS — Z1331 Encounter for screening for depression: Secondary | ICD-10-CM | POA: Diagnosis not present

## 2014-03-05 DIAGNOSIS — E7800 Pure hypercholesterolemia, unspecified: Secondary | ICD-10-CM | POA: Diagnosis not present

## 2014-03-05 DIAGNOSIS — J45909 Unspecified asthma, uncomplicated: Secondary | ICD-10-CM | POA: Diagnosis not present

## 2014-03-05 DIAGNOSIS — I10 Essential (primary) hypertension: Secondary | ICD-10-CM | POA: Diagnosis not present

## 2014-03-05 DIAGNOSIS — H60399 Other infective otitis externa, unspecified ear: Secondary | ICD-10-CM | POA: Diagnosis not present

## 2014-03-05 DIAGNOSIS — Z0000 Encounter for general adult medical examination without abnormal findings: Secondary | ICD-10-CM | POA: Diagnosis not present

## 2014-03-06 DIAGNOSIS — Z1212 Encounter for screening for malignant neoplasm of rectum: Secondary | ICD-10-CM | POA: Diagnosis not present

## 2014-03-11 DIAGNOSIS — H43399 Other vitreous opacities, unspecified eye: Secondary | ICD-10-CM | POA: Diagnosis not present

## 2014-03-11 DIAGNOSIS — H53009 Unspecified amblyopia, unspecified eye: Secondary | ICD-10-CM | POA: Diagnosis not present

## 2014-03-11 DIAGNOSIS — H26499 Other secondary cataract, unspecified eye: Secondary | ICD-10-CM | POA: Diagnosis not present

## 2014-03-11 DIAGNOSIS — H4011X0 Primary open-angle glaucoma, stage unspecified: Secondary | ICD-10-CM | POA: Diagnosis not present

## 2014-03-26 DIAGNOSIS — Z6828 Body mass index (BMI) 28.0-28.9, adult: Secondary | ICD-10-CM | POA: Diagnosis not present

## 2014-03-26 DIAGNOSIS — M5126 Other intervertebral disc displacement, lumbar region: Secondary | ICD-10-CM | POA: Diagnosis not present

## 2014-03-28 DIAGNOSIS — D356 Benign neoplasm of aortic body and other paraganglia: Secondary | ICD-10-CM | POA: Diagnosis not present

## 2014-03-28 DIAGNOSIS — B369 Superficial mycosis, unspecified: Secondary | ICD-10-CM | POA: Diagnosis not present

## 2014-03-28 DIAGNOSIS — H6240 Otitis externa in other diseases classified elsewhere, unspecified ear: Secondary | ICD-10-CM | POA: Diagnosis not present

## 2014-03-28 DIAGNOSIS — H9190 Unspecified hearing loss, unspecified ear: Secondary | ICD-10-CM | POA: Diagnosis not present

## 2014-03-31 DIAGNOSIS — M5126 Other intervertebral disc displacement, lumbar region: Secondary | ICD-10-CM | POA: Diagnosis not present

## 2014-04-01 DIAGNOSIS — M48061 Spinal stenosis, lumbar region without neurogenic claudication: Secondary | ICD-10-CM | POA: Diagnosis not present

## 2014-04-01 DIAGNOSIS — M5126 Other intervertebral disc displacement, lumbar region: Secondary | ICD-10-CM | POA: Diagnosis not present

## 2014-04-08 DIAGNOSIS — H6120 Impacted cerumen, unspecified ear: Secondary | ICD-10-CM | POA: Diagnosis not present

## 2014-04-15 DIAGNOSIS — H70009 Acute mastoiditis without complications, unspecified ear: Secondary | ICD-10-CM | POA: Diagnosis not present

## 2014-05-13 ENCOUNTER — Other Ambulatory Visit: Payer: Self-pay

## 2014-05-13 DIAGNOSIS — Z1231 Encounter for screening mammogram for malignant neoplasm of breast: Secondary | ICD-10-CM

## 2014-05-14 DIAGNOSIS — M48062 Spinal stenosis, lumbar region with neurogenic claudication: Secondary | ICD-10-CM | POA: Diagnosis not present

## 2014-05-14 DIAGNOSIS — Z6828 Body mass index (BMI) 28.0-28.9, adult: Secondary | ICD-10-CM | POA: Diagnosis not present

## 2014-05-14 DIAGNOSIS — J040 Acute laryngitis: Secondary | ICD-10-CM | POA: Diagnosis not present

## 2014-05-21 DIAGNOSIS — H905 Unspecified sensorineural hearing loss: Secondary | ICD-10-CM | POA: Diagnosis not present

## 2014-05-23 DIAGNOSIS — M48062 Spinal stenosis, lumbar region with neurogenic claudication: Secondary | ICD-10-CM | POA: Diagnosis not present

## 2014-06-03 DIAGNOSIS — M48062 Spinal stenosis, lumbar region with neurogenic claudication: Secondary | ICD-10-CM | POA: Diagnosis not present

## 2014-06-16 ENCOUNTER — Ambulatory Visit
Admission: RE | Admit: 2014-06-16 | Discharge: 2014-06-16 | Disposition: A | Discharge status: Home / Self Care | Payer: Medicare Other | Source: Ambulatory Visit

## 2014-06-16 DIAGNOSIS — Z1231 Encounter for screening mammogram for malignant neoplasm of breast: Secondary | ICD-10-CM

## 2014-06-30 DIAGNOSIS — H4011X0 Primary open-angle glaucoma, stage unspecified: Secondary | ICD-10-CM | POA: Diagnosis not present

## 2014-06-30 DIAGNOSIS — H1045 Other chronic allergic conjunctivitis: Secondary | ICD-10-CM | POA: Diagnosis not present

## 2014-07-07 ENCOUNTER — Ambulatory Visit (HOSPITAL_COMMUNITY)
Admission: RE | Admit: 2014-07-07 | Discharge: 2014-07-07 | Disposition: A | Discharge status: Home / Self Care | Payer: Medicare Other | Source: Ambulatory Visit | Attending: Physician Assistant | Admitting: Physician Assistant

## 2014-07-07 ENCOUNTER — Other Ambulatory Visit (HOSPITAL_COMMUNITY): Payer: Self-pay | Admitting: Physician Assistant

## 2014-07-07 DIAGNOSIS — M7989 Other specified soft tissue disorders: Secondary | ICD-10-CM | POA: Diagnosis not present

## 2014-07-07 DIAGNOSIS — M25562 Pain in left knee: Secondary | ICD-10-CM

## 2014-07-07 DIAGNOSIS — M5430 Sciatica, unspecified side: Secondary | ICD-10-CM | POA: Diagnosis not present

## 2014-07-07 DIAGNOSIS — M25569 Pain in unspecified knee: Secondary | ICD-10-CM | POA: Diagnosis not present

## 2014-07-07 DIAGNOSIS — M79609 Pain in unspecified limb: Secondary | ICD-10-CM

## 2014-07-07 NOTE — Progress Notes (Signed)
VASCULAR LAB PRELIMINARY  PRELIMINARY  PRELIMINARY  PRELIMINARY  Bilateral lower extremity venous Dopplers completed.    Preliminary report:  There is no DVT or SVT noted in the bilateral lower extremities.   Yuli Lanigan, RVT 07/07/2014, 5:19 PM

## 2014-07-08 DIAGNOSIS — H1045 Other chronic allergic conjunctivitis: Secondary | ICD-10-CM | POA: Diagnosis not present

## 2014-07-08 DIAGNOSIS — H26499 Other secondary cataract, unspecified eye: Secondary | ICD-10-CM | POA: Diagnosis not present

## 2014-07-08 DIAGNOSIS — H4011X0 Primary open-angle glaucoma, stage unspecified: Secondary | ICD-10-CM | POA: Diagnosis not present

## 2014-07-08 DIAGNOSIS — H43399 Other vitreous opacities, unspecified eye: Secondary | ICD-10-CM | POA: Diagnosis not present

## 2014-07-18 DIAGNOSIS — R7301 Impaired fasting glucose: Secondary | ICD-10-CM | POA: Diagnosis not present

## 2014-07-18 DIAGNOSIS — J309 Allergic rhinitis, unspecified: Secondary | ICD-10-CM | POA: Diagnosis not present

## 2014-07-18 DIAGNOSIS — I809 Phlebitis and thrombophlebitis of unspecified site: Secondary | ICD-10-CM | POA: Diagnosis not present

## 2014-07-18 DIAGNOSIS — IMO0002 Reserved for concepts with insufficient information to code with codable children: Secondary | ICD-10-CM | POA: Diagnosis not present

## 2014-07-18 DIAGNOSIS — M48061 Spinal stenosis, lumbar region without neurogenic claudication: Secondary | ICD-10-CM | POA: Diagnosis not present

## 2014-07-18 DIAGNOSIS — Z6829 Body mass index (BMI) 29.0-29.9, adult: Secondary | ICD-10-CM | POA: Diagnosis not present

## 2014-07-23 DIAGNOSIS — M48062 Spinal stenosis, lumbar region with neurogenic claudication: Secondary | ICD-10-CM | POA: Diagnosis not present

## 2014-08-15 DIAGNOSIS — I10 Essential (primary) hypertension: Secondary | ICD-10-CM | POA: Diagnosis not present

## 2014-08-15 DIAGNOSIS — I809 Phlebitis and thrombophlebitis of unspecified site: Secondary | ICD-10-CM | POA: Diagnosis not present

## 2014-08-15 DIAGNOSIS — IMO0002 Reserved for concepts with insufficient information to code with codable children: Secondary | ICD-10-CM | POA: Diagnosis not present

## 2014-08-15 DIAGNOSIS — I8390 Asymptomatic varicose veins of unspecified lower extremity: Secondary | ICD-10-CM | POA: Diagnosis not present

## 2014-08-15 DIAGNOSIS — Z6829 Body mass index (BMI) 29.0-29.9, adult: Secondary | ICD-10-CM | POA: Diagnosis not present

## 2014-08-26 ENCOUNTER — Encounter: Payer: Self-pay | Admitting: Vascular Surgery

## 2014-08-26 ENCOUNTER — Other Ambulatory Visit: Payer: Self-pay

## 2014-08-26 DIAGNOSIS — M79609 Pain in unspecified limb: Secondary | ICD-10-CM

## 2014-08-26 DIAGNOSIS — I83893 Varicose veins of bilateral lower extremities with other complications: Secondary | ICD-10-CM

## 2014-09-01 DIAGNOSIS — M5126 Other intervertebral disc displacement, lumbar region: Secondary | ICD-10-CM | POA: Diagnosis not present

## 2014-09-01 DIAGNOSIS — M48062 Spinal stenosis, lumbar region with neurogenic claudication: Secondary | ICD-10-CM | POA: Diagnosis not present

## 2014-09-04 DIAGNOSIS — I10 Essential (primary) hypertension: Secondary | ICD-10-CM | POA: Diagnosis not present

## 2014-09-04 DIAGNOSIS — D649 Anemia, unspecified: Secondary | ICD-10-CM | POA: Diagnosis not present

## 2014-09-04 DIAGNOSIS — Z6829 Body mass index (BMI) 29.0-29.9, adult: Secondary | ICD-10-CM | POA: Diagnosis not present

## 2014-09-04 DIAGNOSIS — J45909 Unspecified asthma, uncomplicated: Secondary | ICD-10-CM | POA: Diagnosis not present

## 2014-09-04 DIAGNOSIS — M810 Age-related osteoporosis without current pathological fracture: Secondary | ICD-10-CM | POA: Diagnosis not present

## 2014-09-04 DIAGNOSIS — R7301 Impaired fasting glucose: Secondary | ICD-10-CM | POA: Diagnosis not present

## 2014-09-04 DIAGNOSIS — M4806 Spinal stenosis, lumbar region: Secondary | ICD-10-CM | POA: Diagnosis not present

## 2014-09-04 DIAGNOSIS — Z23 Encounter for immunization: Secondary | ICD-10-CM | POA: Diagnosis not present

## 2014-09-18 ENCOUNTER — Encounter: Payer: Self-pay | Admitting: Vascular Surgery

## 2014-09-19 ENCOUNTER — Ambulatory Visit (INDEPENDENT_AMBULATORY_CARE_PROVIDER_SITE_OTHER): Payer: Medicare Other | Admitting: Vascular Surgery

## 2014-09-19 ENCOUNTER — Encounter: Payer: Self-pay | Admitting: Vascular Surgery

## 2014-09-19 ENCOUNTER — Ambulatory Visit (HOSPITAL_COMMUNITY)
Admission: RE | Admit: 2014-09-19 | Discharge: 2014-09-19 | Disposition: A | Discharge status: Home / Self Care | Payer: Medicare Other | Source: Ambulatory Visit | Attending: Vascular Surgery | Admitting: Vascular Surgery

## 2014-09-19 VITALS — BP 156/72 | HR 81 | Ht 65.0 in | Wt 173.0 lb

## 2014-09-19 DIAGNOSIS — I8393 Asymptomatic varicose veins of bilateral lower extremities: Secondary | ICD-10-CM | POA: Insufficient documentation

## 2014-09-19 DIAGNOSIS — M7989 Other specified soft tissue disorders: Secondary | ICD-10-CM | POA: Insufficient documentation

## 2014-09-19 DIAGNOSIS — I83893 Varicose veins of bilateral lower extremities with other complications: Secondary | ICD-10-CM | POA: Insufficient documentation

## 2014-09-19 DIAGNOSIS — M79609 Pain in unspecified limb: Secondary | ICD-10-CM | POA: Diagnosis not present

## 2014-09-19 NOTE — Progress Notes (Signed)
Referred by:  Marton Redwood, MD [address] [place], [place] [address]  Reason for referral: painful left calf varicose vein  History of Present Illness  Beverly Jimenez is a 80 y.o. (10-07-1925) female who presents with chief complaint: painful vein in left calf.  The patient has had known chronic venous insufficiency and previously undergone some type of stripping procedure in [place] of left calf vein.  She had some bleeding complications from that procedure.  The patient returns now for re-evaluation.  She notes bilateral leg swelling with stinging pain and her left calf where she can feel a vein.  The patient has had known history of DVT, no history of pregnancy, known history of varicose vein, no history of venous stasis ulcers, no history of  Lymphedema and no history of skin changes in lower legs.  There is no family history of venous disorders.  The patient has used compression stockings in the past but not recently.  Past Medical History  Diagnosis Date  . Shortness of breath   . GERD (gastroesophageal reflux disease)   . High cholesterol   . Arthritis     shoulder and knee pain  . Anemia   . Osteoporosis   . Asthma   . Myalgia   . Glaucoma     Hx; of  . Depression     Hx; of situational depression  . Pneumonia     Hx: of "years ago"  . Numbness and tingling     Hx: of B/LLE  . Asthma     Past Surgical History  Procedure Laterality Date  . 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 Back surgery    . Cholecystectomy    . Hernia repair    . Appendectomy    . Eye surgery      catarac  . Vascular surgery      varicose vein surgery  . Colonoscopy      Hx: of  . Lumbar laminectomy/decompression microdiscectomy Left 07/05/2013    Procedure: LUMBAR LAMINECTOMY/DECOMPRESSION MICRODISCECTOMY 1 LEVEL Left Lumbar five-Sacral one;  Surgeon: Charlie Pitter, MD;  Location: [facility];  Service: Neurosurgery;  Laterality: Left;  Left Lumbar Five-Sacral One  Microdiskectomy    History   Social History  . Marital Status: Widowed    Spouse Name: N/A    Number of Children: N/A  . Years of Education: N/A   Occupational History  . Not on file.   Social History Main Topics  . Smoking status: Never Smoker   . Smokeless tobacco: Never Used  . Alcohol Use: No  . Drug Use: No  . Sexual Activity: Not on file   Other Topics Concern  . Not on file   Social History Narrative  . No narrative on file    Family History  Problem Relation Age of Onset  . Hypertension Other   . Heart disease Other   . Glaucoma Other   . Diabetes Other   . Varicose Veins Mother   . Diabetes Brother   . Heart disease Brother   . Hypertension Brother   . Varicose Veins Brother   . Varicose Veins Sister   . Varicose Veins Daughter     Current Outpatient Prescriptions on File Prior to Visit  Medication Sig Dispense Refill  . acetaminophen (TYLENOL) 500 MG tablet Take 500 mg by mouth as needed.      Marland Kitchen aspirin EC 81 MG tablet Take 81 mg  by mouth daily.      . bimatoprost (LUMIGAN) 0.01 % SOLN Place 1 drop into both eyes at bedtime.      . Calcium Carbonate-Vit D-Min (CALTRATE PLUS PO) Take 1,000 mg by mouth 2 (two) times daily.      . cholecalciferol (VITAMIN D) 1000 UNITS tablet Take 1,000 Units by mouth daily.      . Cyanocobalamin (VITAMIN B-12 PO) Place 1 tablet under the tongue daily.      Marland Kitchen esomeprazole (NEXIUM) 40 MG capsule Take 40 mg by mouth daily before breakfast.      . fish oil-omega-3 fatty acids 1000 MG capsule Take 2 g by mouth 2 (two) times daily.       . Fluticasone-Salmeterol (ADVAIR) 250-50 MCG/DOSE AEPB Inhale 1 puff into the lungs every 12 (twelve) hours.      . niacin 500 MG tablet Take 500 mg by mouth daily with breakfast.      . Potassium 99 MG TABS Take 99 mg by mouth daily.      . Red Yeast Rice Extract (RED YEAST RICE PO) Take 2 capsules by mouth at bedtime.      . vitamin C (ASCORBIC ACID) 500 MG tablet Take 500 mg by mouth  daily.      . vitamin E 400 UNIT capsule Take 400 Units by mouth daily.      Marland Kitchen albuterol (PROVENTIL HFA;VENTOLIN HFA) 108 (90 BASE) MCG/ACT inhaler Inhale 2 puffs into the lungs every 6 (six) hours as needed for wheezing or shortness of breath.       . cyclobenzaprine (FLEXERIL) 10 MG tablet Take 0.5 tablets (5 mg total) by mouth 3 (three) times daily as needed for muscle spasms.  50 tablet  0  . fluticasone (FLONASE) 50 MCG/ACT nasal spray Place 2 sprays into the nose at bedtime.       Marland Kitchen HYDROcodone-acetaminophen (NORCO/VICODIN) 5-325 MG per tablet Take 1-2 tablets by mouth every 4 (four) hours as needed.  100 tablet  0  . naproxen (NAPROSYN) 500 MG tablet Take 500 mg by mouth 2 (two) times daily as needed (pain).       No current facility-administered medications on file prior to visit.    Allergies  Allergen Reactions  . Macrodantin [Nitrofurantoin Macrocrystal] Shortness Of Breath  . Amoxicillin Rash  . Sulfa Antibiotics Rash     REVIEW OF SYSTEMS:  (Positives checked otherwise negative)  CARDIOVASCULAR:  []  chest pain, []  chest pressure, []  palpitations, []  shortness of breath when laying flat, []  shortness of breath with exertion,  []  pain in feet when walking, [x]  pain in feet when laying flat, [x]  history of blood clot in veins (DVT), []  history of phlebitis, [x]  swelling in legs, [x]  varicose veins  PULMONARY:  []  productive cough, [x]  asthma, []  wheezing  NEUROLOGIC:  [x]  weakness in arms or legs, [x]  numbness in arms or legs, []  difficulty speaking or slurred speech, []  temporary loss of vision in one eye, [x]  dizziness  HEMATOLOGIC:  []  bleeding problems, []  problems with blood clotting too easily  MUSCULOSKEL:  []  joint pain, []  joint swelling  GASTROINTEST:  []  vomiting blood, []  blood in stool     GENITOURINARY:  []  burning with urination, []  blood in urine  PSYCHIATRIC:  []  history of major depression  INTEGUMENTARY:  []  rashes, []  ulcers  CONSTITUTIONAL:  []   fever, []  chills   Physical Examination Filed Vitals:   09/19/14 1412  BP: 156/72  Pulse: 81  Height:  5\' 5"  (1.651 m)  Weight: 173 lb (78.472 kg)  SpO2: 98%   Body mass index is 28.79 kg/(m^2).  General: A&O x 3, WDWN  Head: [place]/AT  Ear/Nose/Throat: Hearing grossly intact, nares w/o erythema or drainage, oropharynx w/o Erythema/Exudate  Eyes: PERRLA, EOMI  Neck: Supple, no nuchal rigidity, no palpable LAD  Pulmonary: Sym exp, good air movt, CTAB, no rales, rhonchi, & wheezing  Cardiac: RRR, Nl S1, S2, no Murmurs, rubs or gallops  Vascular: Vessel Right Left  Radial Palpable Palpable  Brachial Palpable Palpable  Carotid Palpable, without bruit Palpable, without bruit  Aorta Not palpable N/A  Femoral Palpable Palpable  Popliteal Not palpable Not palpable  PT Palpable Palpable  DP Palpable Palpable   Gastrointestinal: soft, NTND, -G/R, - HSM, - masses, - CVAT B  Musculoskeletal: M/S 5/5 throughout , Extremities without ischemic changes , multiple large varicosities in R leg, L posterior calf with palpable bulging varicosity  Neurologic: CN 2-12 intact , Pain and light touch intact in extremities except decreased in L foot, Motor exam as listed above  Psychiatric: Judgment intact, Mood & affect appropriate for pt's clinical situation  Dermatologic: See M/S exam for extremity exam, no rashes otherwise noted  Lymph : No Cervical, Axillary, or Inguinal lymphadenopathy  Non-Invasive Vascular Imaging  BLE Venous Insufficiency Duplex (Date: 09/19/2014):   RLE: no DVT and SVT, + GSV reflux, + deep venous reflux  LLE: no DVT and SVT, + GSV reflux, +SSV reflux: aberrant branch adjacent to SSV, + deep venous reflux   Medical Decision Making  Beverly Jimenez is a 80 y.o. female who presents with: BLE chronic venous insufficiency (C2).   Based on the patient's history and examination, I recommend: compressive therapy.  I discussed with the patient the use of her 20-30  mm thigh high compression stockings and need for 3 month trial of such.  The patient will follow up in 3 months with my partners in the [facility] for evaluation for: stab phlebectomy of L calf varicosity, possible R GSV EVLA.  Thank you for allowing [place] to participate in this patient's care.  Adele Barthel, MD [facility] Office: [phone] Pager: [phone]  09/19/2014, 2:50 PM

## 2014-09-23 DIAGNOSIS — Z6830 Body mass index (BMI) 30.0-30.9, adult: Secondary | ICD-10-CM | POA: Diagnosis not present

## 2014-09-23 DIAGNOSIS — R1084 Generalized abdominal pain: Secondary | ICD-10-CM | POA: Diagnosis not present

## 2014-09-24 ENCOUNTER — Other Ambulatory Visit: Payer: Self-pay | Admitting: Internal Medicine

## 2014-09-24 DIAGNOSIS — R1084 Generalized abdominal pain: Secondary | ICD-10-CM

## 2014-09-24 DIAGNOSIS — D649 Anemia, unspecified: Secondary | ICD-10-CM | POA: Diagnosis not present

## 2014-09-25 DIAGNOSIS — D649 Anemia, unspecified: Secondary | ICD-10-CM | POA: Diagnosis not present

## 2014-09-26 ENCOUNTER — Ambulatory Visit
Admission: RE | Admit: 2014-09-26 | Discharge: 2014-09-26 | Disposition: A | Discharge status: Home / Self Care | Payer: Medicare Other | Source: Ambulatory Visit | Attending: Internal Medicine | Admitting: Internal Medicine

## 2014-09-26 DIAGNOSIS — R140 Abdominal distension (gaseous): Secondary | ICD-10-CM | POA: Diagnosis not present

## 2014-09-26 DIAGNOSIS — R110 Nausea: Secondary | ICD-10-CM | POA: Diagnosis not present

## 2014-09-26 DIAGNOSIS — R1084 Generalized abdominal pain: Secondary | ICD-10-CM

## 2014-09-26 DIAGNOSIS — R109 Unspecified abdominal pain: Secondary | ICD-10-CM | POA: Diagnosis not present

## 2014-09-26 DIAGNOSIS — I728 Aneurysm of other specified arteries: Secondary | ICD-10-CM | POA: Diagnosis not present

## 2014-09-26 MED ORDER — IOHEXOL 300 MG/ML  SOLN
100.0000 mL | Freq: Once | INTRAMUSCULAR | Status: AC | PRN
  Administered 2014-09-26: 100 mL via INTRAVENOUS

## 2014-10-20 DIAGNOSIS — H6063 Unspecified chronic otitis externa, bilateral: Secondary | ICD-10-CM | POA: Diagnosis not present

## 2014-10-20 DIAGNOSIS — H6123 Impacted cerumen, bilateral: Secondary | ICD-10-CM | POA: Diagnosis not present

## 2014-10-29 DIAGNOSIS — S86919A Strain of unspecified muscle(s) and tendon(s) at lower leg level, unspecified leg, initial encounter: Secondary | ICD-10-CM | POA: Diagnosis not present

## 2014-10-29 DIAGNOSIS — S46919A Strain of unspecified muscle, fascia and tendon at shoulder and upper arm level, unspecified arm, initial encounter: Secondary | ICD-10-CM | POA: Diagnosis not present

## 2014-10-29 DIAGNOSIS — M47812 Spondylosis without myelopathy or radiculopathy, cervical region: Secondary | ICD-10-CM | POA: Diagnosis not present

## 2014-10-29 DIAGNOSIS — S43409A Unspecified sprain of unspecified shoulder joint, initial encounter: Secondary | ICD-10-CM | POA: Diagnosis not present

## 2014-10-29 DIAGNOSIS — M546 Pain in thoracic spine: Secondary | ICD-10-CM | POA: Diagnosis not present

## 2014-10-29 DIAGNOSIS — M545 Low back pain: Secondary | ICD-10-CM | POA: Diagnosis not present

## 2014-10-29 DIAGNOSIS — M5030 Other cervical disc degeneration, unspecified cervical region: Secondary | ICD-10-CM | POA: Diagnosis not present

## 2014-10-29 DIAGNOSIS — M542 Cervicalgia: Secondary | ICD-10-CM | POA: Diagnosis not present

## 2014-11-03 DIAGNOSIS — M47812 Spondylosis without myelopathy or radiculopathy, cervical region: Secondary | ICD-10-CM | POA: Diagnosis not present

## 2014-11-03 DIAGNOSIS — M5030 Other cervical disc degeneration, unspecified cervical region: Secondary | ICD-10-CM | POA: Diagnosis not present

## 2014-11-03 DIAGNOSIS — S46919A Strain of unspecified muscle, fascia and tendon at shoulder and upper arm level, unspecified arm, initial encounter: Secondary | ICD-10-CM | POA: Diagnosis not present

## 2014-11-03 DIAGNOSIS — M542 Cervicalgia: Secondary | ICD-10-CM | POA: Diagnosis not present

## 2014-11-03 DIAGNOSIS — S43409A Unspecified sprain of unspecified shoulder joint, initial encounter: Secondary | ICD-10-CM | POA: Diagnosis not present

## 2014-11-03 DIAGNOSIS — S86919A Strain of unspecified muscle(s) and tendon(s) at lower leg level, unspecified leg, initial encounter: Secondary | ICD-10-CM | POA: Diagnosis not present

## 2014-11-03 DIAGNOSIS — M545 Low back pain: Secondary | ICD-10-CM | POA: Diagnosis not present

## 2014-11-03 DIAGNOSIS — M546 Pain in thoracic spine: Secondary | ICD-10-CM | POA: Diagnosis not present

## 2014-11-05 DIAGNOSIS — M47812 Spondylosis without myelopathy or radiculopathy, cervical region: Secondary | ICD-10-CM | POA: Diagnosis not present

## 2014-11-05 DIAGNOSIS — S86919A Strain of unspecified muscle(s) and tendon(s) at lower leg level, unspecified leg, initial encounter: Secondary | ICD-10-CM | POA: Diagnosis not present

## 2014-11-05 DIAGNOSIS — M542 Cervicalgia: Secondary | ICD-10-CM | POA: Diagnosis not present

## 2014-11-05 DIAGNOSIS — M545 Low back pain: Secondary | ICD-10-CM | POA: Diagnosis not present

## 2014-11-05 DIAGNOSIS — S43409A Unspecified sprain of unspecified shoulder joint, initial encounter: Secondary | ICD-10-CM | POA: Diagnosis not present

## 2014-11-05 DIAGNOSIS — M5030 Other cervical disc degeneration, unspecified cervical region: Secondary | ICD-10-CM | POA: Diagnosis not present

## 2014-11-05 DIAGNOSIS — M546 Pain in thoracic spine: Secondary | ICD-10-CM | POA: Diagnosis not present

## 2014-11-05 DIAGNOSIS — S46919A Strain of unspecified muscle, fascia and tendon at shoulder and upper arm level, unspecified arm, initial encounter: Secondary | ICD-10-CM | POA: Diagnosis not present

## 2014-11-10 DIAGNOSIS — M5030 Other cervical disc degeneration, unspecified cervical region: Secondary | ICD-10-CM | POA: Diagnosis not present

## 2014-11-10 DIAGNOSIS — M47812 Spondylosis without myelopathy or radiculopathy, cervical region: Secondary | ICD-10-CM | POA: Diagnosis not present

## 2014-11-10 DIAGNOSIS — S43409A Unspecified sprain of unspecified shoulder joint, initial encounter: Secondary | ICD-10-CM | POA: Diagnosis not present

## 2014-11-10 DIAGNOSIS — M546 Pain in thoracic spine: Secondary | ICD-10-CM | POA: Diagnosis not present

## 2014-11-10 DIAGNOSIS — M542 Cervicalgia: Secondary | ICD-10-CM | POA: Diagnosis not present

## 2014-11-10 DIAGNOSIS — S46919A Strain of unspecified muscle, fascia and tendon at shoulder and upper arm level, unspecified arm, initial encounter: Secondary | ICD-10-CM | POA: Diagnosis not present

## 2014-11-10 DIAGNOSIS — M545 Low back pain: Secondary | ICD-10-CM | POA: Diagnosis not present

## 2014-11-10 DIAGNOSIS — S86919A Strain of unspecified muscle(s) and tendon(s) at lower leg level, unspecified leg, initial encounter: Secondary | ICD-10-CM | POA: Diagnosis not present

## 2014-11-12 DIAGNOSIS — M810 Age-related osteoporosis without current pathological fracture: Secondary | ICD-10-CM | POA: Diagnosis not present

## 2014-11-12 DIAGNOSIS — M859 Disorder of bone density and structure, unspecified: Secondary | ICD-10-CM | POA: Diagnosis not present

## 2014-11-12 DIAGNOSIS — Z6829 Body mass index (BMI) 29.0-29.9, adult: Secondary | ICD-10-CM | POA: Diagnosis not present

## 2014-11-12 DIAGNOSIS — Z79899 Other long term (current) drug therapy: Secondary | ICD-10-CM | POA: Diagnosis not present

## 2014-11-17 DIAGNOSIS — M545 Low back pain: Secondary | ICD-10-CM | POA: Diagnosis not present

## 2014-11-17 DIAGNOSIS — M546 Pain in thoracic spine: Secondary | ICD-10-CM | POA: Diagnosis not present

## 2014-11-17 DIAGNOSIS — S43409A Unspecified sprain of unspecified shoulder joint, initial encounter: Secondary | ICD-10-CM | POA: Diagnosis not present

## 2014-11-17 DIAGNOSIS — M47812 Spondylosis without myelopathy or radiculopathy, cervical region: Secondary | ICD-10-CM | POA: Diagnosis not present

## 2014-11-17 DIAGNOSIS — S46919A Strain of unspecified muscle, fascia and tendon at shoulder and upper arm level, unspecified arm, initial encounter: Secondary | ICD-10-CM | POA: Diagnosis not present

## 2014-11-17 DIAGNOSIS — M5030 Other cervical disc degeneration, unspecified cervical region: Secondary | ICD-10-CM | POA: Diagnosis not present

## 2014-11-17 DIAGNOSIS — S86919A Strain of unspecified muscle(s) and tendon(s) at lower leg level, unspecified leg, initial encounter: Secondary | ICD-10-CM | POA: Diagnosis not present

## 2014-11-17 DIAGNOSIS — M542 Cervicalgia: Secondary | ICD-10-CM | POA: Diagnosis not present

## 2014-11-25 DIAGNOSIS — M47812 Spondylosis without myelopathy or radiculopathy, cervical region: Secondary | ICD-10-CM | POA: Diagnosis not present

## 2014-11-25 DIAGNOSIS — M546 Pain in thoracic spine: Secondary | ICD-10-CM | POA: Diagnosis not present

## 2014-11-25 DIAGNOSIS — S46919A Strain of unspecified muscle, fascia and tendon at shoulder and upper arm level, unspecified arm, initial encounter: Secondary | ICD-10-CM | POA: Diagnosis not present

## 2014-11-25 DIAGNOSIS — M542 Cervicalgia: Secondary | ICD-10-CM | POA: Diagnosis not present

## 2014-11-25 DIAGNOSIS — S43409A Unspecified sprain of unspecified shoulder joint, initial encounter: Secondary | ICD-10-CM | POA: Diagnosis not present

## 2014-11-25 DIAGNOSIS — S86919A Strain of unspecified muscle(s) and tendon(s) at lower leg level, unspecified leg, initial encounter: Secondary | ICD-10-CM | POA: Diagnosis not present

## 2014-11-25 DIAGNOSIS — M545 Low back pain: Secondary | ICD-10-CM | POA: Diagnosis not present

## 2014-11-25 DIAGNOSIS — M5030 Other cervical disc degeneration, unspecified cervical region: Secondary | ICD-10-CM | POA: Diagnosis not present

## 2014-12-01 ENCOUNTER — Encounter (HOSPITAL_COMMUNITY): Payer: Self-pay

## 2014-12-01 ENCOUNTER — Other Ambulatory Visit (HOSPITAL_COMMUNITY): Payer: Self-pay | Admitting: Internal Medicine

## 2014-12-01 ENCOUNTER — Ambulatory Visit (HOSPITAL_COMMUNITY)
Admission: RE | Admit: 2014-12-01 | Discharge: 2014-12-01 | Disposition: A | Discharge status: Home / Self Care | Payer: Medicare Other | Source: Ambulatory Visit | Attending: Internal Medicine | Admitting: Internal Medicine

## 2014-12-01 DIAGNOSIS — M810 Age-related osteoporosis without current pathological fracture: Secondary | ICD-10-CM | POA: Diagnosis not present

## 2014-12-01 MED ORDER — ZOLEDRONIC ACID 5 MG/100ML IV SOLN
5.0000 mg | Freq: Once | INTRAVENOUS | Status: AC
  Administered 2014-12-01: 5 mg via INTRAVENOUS
  Filled 2014-12-01: qty 100

## 2014-12-01 MED ORDER — SODIUM CHLORIDE 0.9 % IV SOLN
INTRAVENOUS | Status: DC
  Administered 2014-12-01: 14:00:00 via INTRAVENOUS

## 2014-12-01 NOTE — Discharge Instructions (Signed)

## 2014-12-09 DIAGNOSIS — M545 Low back pain: Secondary | ICD-10-CM | POA: Diagnosis not present

## 2014-12-09 DIAGNOSIS — M542 Cervicalgia: Secondary | ICD-10-CM | POA: Diagnosis not present

## 2014-12-09 DIAGNOSIS — S46919A Strain of unspecified muscle, fascia and tendon at shoulder and upper arm level, unspecified arm, initial encounter: Secondary | ICD-10-CM | POA: Diagnosis not present

## 2014-12-09 DIAGNOSIS — M5030 Other cervical disc degeneration, unspecified cervical region: Secondary | ICD-10-CM | POA: Diagnosis not present

## 2014-12-09 DIAGNOSIS — S43409A Unspecified sprain of unspecified shoulder joint, initial encounter: Secondary | ICD-10-CM | POA: Diagnosis not present

## 2014-12-09 DIAGNOSIS — M546 Pain in thoracic spine: Secondary | ICD-10-CM | POA: Diagnosis not present

## 2014-12-09 DIAGNOSIS — M47812 Spondylosis without myelopathy or radiculopathy, cervical region: Secondary | ICD-10-CM | POA: Diagnosis not present

## 2014-12-09 DIAGNOSIS — S86919A Strain of unspecified muscle(s) and tendon(s) at lower leg level, unspecified leg, initial encounter: Secondary | ICD-10-CM | POA: Diagnosis not present

## 2014-12-09 DIAGNOSIS — S8390XA Sprain of unspecified site of unspecified knee, initial encounter: Secondary | ICD-10-CM | POA: Diagnosis not present

## 2014-12-11 DIAGNOSIS — M542 Cervicalgia: Secondary | ICD-10-CM | POA: Diagnosis not present

## 2014-12-11 DIAGNOSIS — S86919A Strain of unspecified muscle(s) and tendon(s) at lower leg level, unspecified leg, initial encounter: Secondary | ICD-10-CM | POA: Diagnosis not present

## 2014-12-11 DIAGNOSIS — S46919A Strain of unspecified muscle, fascia and tendon at shoulder and upper arm level, unspecified arm, initial encounter: Secondary | ICD-10-CM | POA: Diagnosis not present

## 2014-12-11 DIAGNOSIS — M47812 Spondylosis without myelopathy or radiculopathy, cervical region: Secondary | ICD-10-CM | POA: Diagnosis not present

## 2014-12-11 DIAGNOSIS — S8390XA Sprain of unspecified site of unspecified knee, initial encounter: Secondary | ICD-10-CM | POA: Diagnosis not present

## 2014-12-11 DIAGNOSIS — M5030 Other cervical disc degeneration, unspecified cervical region: Secondary | ICD-10-CM | POA: Diagnosis not present

## 2014-12-11 DIAGNOSIS — M545 Low back pain: Secondary | ICD-10-CM | POA: Diagnosis not present

## 2014-12-11 DIAGNOSIS — M546 Pain in thoracic spine: Secondary | ICD-10-CM | POA: Diagnosis not present

## 2014-12-11 DIAGNOSIS — S43409A Unspecified sprain of unspecified shoulder joint, initial encounter: Secondary | ICD-10-CM | POA: Diagnosis not present

## 2014-12-16 DIAGNOSIS — S46919A Strain of unspecified muscle, fascia and tendon at shoulder and upper arm level, unspecified arm, initial encounter: Secondary | ICD-10-CM | POA: Diagnosis not present

## 2014-12-16 DIAGNOSIS — M5030 Other cervical disc degeneration, unspecified cervical region: Secondary | ICD-10-CM | POA: Diagnosis not present

## 2014-12-16 DIAGNOSIS — S8390XA Sprain of unspecified site of unspecified knee, initial encounter: Secondary | ICD-10-CM | POA: Diagnosis not present

## 2014-12-16 DIAGNOSIS — M545 Low back pain: Secondary | ICD-10-CM | POA: Diagnosis not present

## 2014-12-16 DIAGNOSIS — M47812 Spondylosis without myelopathy or radiculopathy, cervical region: Secondary | ICD-10-CM | POA: Diagnosis not present

## 2014-12-16 DIAGNOSIS — M546 Pain in thoracic spine: Secondary | ICD-10-CM | POA: Diagnosis not present

## 2014-12-16 DIAGNOSIS — S86919A Strain of unspecified muscle(s) and tendon(s) at lower leg level, unspecified leg, initial encounter: Secondary | ICD-10-CM | POA: Diagnosis not present

## 2014-12-16 DIAGNOSIS — M542 Cervicalgia: Secondary | ICD-10-CM | POA: Diagnosis not present

## 2014-12-16 DIAGNOSIS — S43409A Unspecified sprain of unspecified shoulder joint, initial encounter: Secondary | ICD-10-CM | POA: Diagnosis not present

## 2014-12-23 ENCOUNTER — Ambulatory Visit: Payer: Medicare Other | Admitting: Vascular Surgery

## 2014-12-23 DIAGNOSIS — M47812 Spondylosis without myelopathy or radiculopathy, cervical region: Secondary | ICD-10-CM | POA: Diagnosis not present

## 2014-12-23 DIAGNOSIS — S86919A Strain of unspecified muscle(s) and tendon(s) at lower leg level, unspecified leg, initial encounter: Secondary | ICD-10-CM | POA: Diagnosis not present

## 2014-12-23 DIAGNOSIS — M542 Cervicalgia: Secondary | ICD-10-CM | POA: Diagnosis not present

## 2014-12-23 DIAGNOSIS — S8390XA Sprain of unspecified site of unspecified knee, initial encounter: Secondary | ICD-10-CM | POA: Diagnosis not present

## 2014-12-23 DIAGNOSIS — S43409A Unspecified sprain of unspecified shoulder joint, initial encounter: Secondary | ICD-10-CM | POA: Diagnosis not present

## 2014-12-23 DIAGNOSIS — M546 Pain in thoracic spine: Secondary | ICD-10-CM | POA: Diagnosis not present

## 2014-12-23 DIAGNOSIS — M5030 Other cervical disc degeneration, unspecified cervical region: Secondary | ICD-10-CM | POA: Diagnosis not present

## 2014-12-23 DIAGNOSIS — M545 Low back pain: Secondary | ICD-10-CM | POA: Diagnosis not present

## 2014-12-23 DIAGNOSIS — S46919A Strain of unspecified muscle, fascia and tendon at shoulder and upper arm level, unspecified arm, initial encounter: Secondary | ICD-10-CM | POA: Diagnosis not present

## 2014-12-25 DIAGNOSIS — M545 Low back pain: Secondary | ICD-10-CM | POA: Diagnosis not present

## 2014-12-25 DIAGNOSIS — S8390XA Sprain of unspecified site of unspecified knee, initial encounter: Secondary | ICD-10-CM | POA: Diagnosis not present

## 2014-12-25 DIAGNOSIS — M542 Cervicalgia: Secondary | ICD-10-CM | POA: Diagnosis not present

## 2014-12-25 DIAGNOSIS — M5030 Other cervical disc degeneration, unspecified cervical region: Secondary | ICD-10-CM | POA: Diagnosis not present

## 2014-12-25 DIAGNOSIS — S46919A Strain of unspecified muscle, fascia and tendon at shoulder and upper arm level, unspecified arm, initial encounter: Secondary | ICD-10-CM | POA: Diagnosis not present

## 2014-12-25 DIAGNOSIS — S86919A Strain of unspecified muscle(s) and tendon(s) at lower leg level, unspecified leg, initial encounter: Secondary | ICD-10-CM | POA: Diagnosis not present

## 2014-12-25 DIAGNOSIS — S43409A Unspecified sprain of unspecified shoulder joint, initial encounter: Secondary | ICD-10-CM | POA: Diagnosis not present

## 2014-12-25 DIAGNOSIS — M47812 Spondylosis without myelopathy or radiculopathy, cervical region: Secondary | ICD-10-CM | POA: Diagnosis not present

## 2014-12-25 DIAGNOSIS — M546 Pain in thoracic spine: Secondary | ICD-10-CM | POA: Diagnosis not present

## 2014-12-29 ENCOUNTER — Encounter: Payer: Self-pay | Admitting: Vascular Surgery

## 2014-12-30 ENCOUNTER — Ambulatory Visit: Payer: Medicare Other | Admitting: Vascular Surgery

## 2014-12-31 DIAGNOSIS — S8390XA Sprain of unspecified site of unspecified knee, initial encounter: Secondary | ICD-10-CM | POA: Diagnosis not present

## 2014-12-31 DIAGNOSIS — S86919A Strain of unspecified muscle(s) and tendon(s) at lower leg level, unspecified leg, initial encounter: Secondary | ICD-10-CM | POA: Diagnosis not present

## 2014-12-31 DIAGNOSIS — M47812 Spondylosis without myelopathy or radiculopathy, cervical region: Secondary | ICD-10-CM | POA: Diagnosis not present

## 2014-12-31 DIAGNOSIS — M546 Pain in thoracic spine: Secondary | ICD-10-CM | POA: Diagnosis not present

## 2014-12-31 DIAGNOSIS — M5030 Other cervical disc degeneration, unspecified cervical region: Secondary | ICD-10-CM | POA: Diagnosis not present

## 2014-12-31 DIAGNOSIS — M545 Low back pain: Secondary | ICD-10-CM | POA: Diagnosis not present

## 2014-12-31 DIAGNOSIS — S46919A Strain of unspecified muscle, fascia and tendon at shoulder and upper arm level, unspecified arm, initial encounter: Secondary | ICD-10-CM | POA: Diagnosis not present

## 2014-12-31 DIAGNOSIS — S43409A Unspecified sprain of unspecified shoulder joint, initial encounter: Secondary | ICD-10-CM | POA: Diagnosis not present

## 2014-12-31 DIAGNOSIS — M542 Cervicalgia: Secondary | ICD-10-CM | POA: Diagnosis not present

## 2015-01-05 ENCOUNTER — Encounter: Payer: Self-pay | Admitting: Vascular Surgery

## 2015-01-05 DIAGNOSIS — H6122 Impacted cerumen, left ear: Secondary | ICD-10-CM | POA: Diagnosis not present

## 2015-01-05 DIAGNOSIS — H6063 Unspecified chronic otitis externa, bilateral: Secondary | ICD-10-CM | POA: Diagnosis not present

## 2015-01-06 ENCOUNTER — Ambulatory Visit (INDEPENDENT_AMBULATORY_CARE_PROVIDER_SITE_OTHER): Payer: Medicare Other | Admitting: Vascular Surgery

## 2015-01-06 ENCOUNTER — Encounter: Payer: Self-pay | Admitting: Vascular Surgery

## 2015-01-06 VITALS — BP 142/51 | Ht 65.0 in | Wt 172.0 lb

## 2015-01-06 DIAGNOSIS — M79605 Pain in left leg: Secondary | ICD-10-CM | POA: Diagnosis not present

## 2015-01-06 DIAGNOSIS — H4011X1 Primary open-angle glaucoma, mild stage: Secondary | ICD-10-CM | POA: Diagnosis not present

## 2015-01-06 NOTE — Progress Notes (Signed)
Subjective:     Patient ID: Beverly Jimenez, female   DOB: 08/13/1925, 81 y.o.   MRN: [id number]  HPI this 81 year old female returns to discuss her left leg discomfort and chronic venous insufficiency. She was seen by Dr. Adele Barthel 3 months ago. She was found to have some reflux in her left saphenofemoral junction and in the distal part of her right great saphenous vein. She has no history of DVT or thrombophlebitis. She was having some left leg discomfort which has improved significantly with a long-leg elastic compression stockings. She is not experiencing distal swelling at the present time. She states her symptoms are much better.  Past Medical History  Diagnosis Date  . Shortness of breath   . GERD (gastroesophageal reflux disease)   . High cholesterol   . Arthritis     shoulder and knee pain  . Anemia   . Osteoporosis   . Asthma   . Myalgia   . Glaucoma     Hx; of  . Depression     Hx; of situational depression  . Pneumonia     Hx: of "years ago"  . Numbness and tingling     Hx: of B/LLE  . Asthma     History  Substance Use Topics  . Smoking status: Never Smoker   . Smokeless tobacco: Never Used  . Alcohol Use: No    Family History  Problem Relation Age of Onset  . Hypertension Other   . Heart disease Other   . Glaucoma Other   . Diabetes Other   . Varicose Veins Mother   . Diabetes Brother   . Heart disease Brother   . Hypertension Brother   . Varicose Veins Brother   . Varicose Veins Sister   . Varicose Veins Daughter     Allergies  Allergen Reactions  . Macrodantin [Nitrofurantoin Macrocrystal] Shortness Of Breath  . Amoxicillin Rash  . Sulfa Antibiotics Rash     Current outpatient prescriptions:  .  acetaminophen (TYLENOL) 500 MG tablet, Take 500 mg by mouth as needed., Disp: , Rfl:  .  albuterol (PROVENTIL HFA;VENTOLIN HFA) 108 (90 BASE) MCG/ACT inhaler, Inhale 2 puffs into the lungs every 6 (six) hours as needed for wheezing or shortness of  breath. , Disp: , Rfl:  .  aspirin EC 81 MG tablet, Take 81 mg by mouth daily., Disp: , Rfl:  .  bimatoprost (LUMIGAN) 0.01 % SOLN, Place 1 drop into both eyes at bedtime., Disp: , Rfl:  .  Calcium Carbonate-Vit D-Min (CALTRATE PLUS PO), Take 1,000 mg by mouth 2 (two) times daily., Disp: , Rfl:  .  cholecalciferol (VITAMIN D) 1000 UNITS tablet, Take 1,000 Units by mouth daily., Disp: , Rfl:  .  Cyanocobalamin (VITAMIN B-12 PO), Place 1 tablet under the tongue daily., Disp: , Rfl:  .  cyclobenzaprine (FLEXERIL) 10 MG tablet, Take 0.5 tablets (5 mg total) by mouth 3 (three) times daily as needed for muscle spasms., Disp: 50 tablet, Rfl: 0 .  esomeprazole (NEXIUM) 40 MG capsule, Take 40 mg by mouth daily before breakfast., Disp: , Rfl:  .  fish oil-omega-3 fatty acids 1000 MG capsule, Take 2 g by mouth 2 (two) times daily. , Disp: , Rfl:  .  fluticasone (FLONASE) 50 MCG/ACT nasal spray, Place 2 sprays into the nose at bedtime. , Disp: , Rfl:  .  Fluticasone-Salmeterol (ADVAIR) 250-50 MCG/DOSE AEPB, Inhale 1 puff into the lungs every 12 (twelve) hours., Disp: , Rfl:  .  HYDROcodone-acetaminophen (NORCO/VICODIN) 5-325 MG per tablet, Take 1-2 tablets by mouth every 4 (four) hours as needed., Disp: 100 tablet, Rfl: 0 .  irbesartan (AVAPRO) 150 MG tablet, Take 150 mg by mouth daily., Disp: , Rfl:  .  Loratadine (CLARITIN PO), Take by mouth as needed., Disp: , Rfl:  .  naproxen (NAPROSYN) 500 MG tablet, Take 500 mg by mouth 2 (two) times daily as needed (pain)., Disp: , Rfl:  .  niacin 500 MG tablet, Take 500 mg by mouth daily with breakfast., Disp: , Rfl:  .  Potassium 99 MG TABS, Take 99 mg by mouth daily., Disp: , Rfl:  .  Red Yeast Rice Extract (RED YEAST RICE PO), Take 2 capsules by mouth at bedtime., Disp: , Rfl:  .  vitamin C (ASCORBIC ACID) 500 MG tablet, Take 500 mg by mouth daily., Disp: , Rfl:  .  vitamin E 400 UNIT capsule, Take 400 Units by mouth daily., Disp: , Rfl:   BP 142/51 mmHg  Ht 5'  5" (1.651 m)  Wt 172 lb (78.019 kg)  BMI 28.62 kg/m2  SpO2 100%  Body mass index is 28.62 kg/(m^2).           Review of Systems denies chest pain, dyspnea on exertion, PND, orthopnea. Complains of left buttock and lateral thigh discomfort which radiates down to the left foot.     Objective:   Physical Exam BP 142/51 mmHg  Ht 5\' 5"  (1.651 m)  Wt 172 lb (78.019 kg)  BMI 28.62 kg/m2  SpO2 100%  Gen. elderly female no apparent stress alert and oriented 3 Lungs no rhonchi or wheezing Left leg with no bulging varicosities hyperpigmentation ulceration or other severe sequela of venous insufficiency noted. She does have some prominent reticular and spider veins.       Assessment:     Mild venous insufficiency with resolution of symptoms with compression stockings    Plan:     Recommend patient continue long-leg elastic compression stockings elevation of the legs at night which she will return to CS on when necessary basis Am not recommending any interventions on this lady

## 2015-01-12 DIAGNOSIS — F419 Anxiety disorder, unspecified: Secondary | ICD-10-CM | POA: Diagnosis not present

## 2015-01-12 DIAGNOSIS — I10 Essential (primary) hypertension: Secondary | ICD-10-CM | POA: Diagnosis not present

## 2015-01-12 DIAGNOSIS — M5430 Sciatica, unspecified side: Secondary | ICD-10-CM | POA: Diagnosis not present

## 2015-01-12 DIAGNOSIS — M5416 Radiculopathy, lumbar region: Secondary | ICD-10-CM | POA: Diagnosis not present

## 2015-01-26 DIAGNOSIS — M5441 Lumbago with sciatica, right side: Secondary | ICD-10-CM | POA: Diagnosis not present

## 2015-01-26 DIAGNOSIS — M5442 Lumbago with sciatica, left side: Secondary | ICD-10-CM | POA: Diagnosis not present

## 2015-01-29 DIAGNOSIS — M5442 Lumbago with sciatica, left side: Secondary | ICD-10-CM | POA: Diagnosis not present

## 2015-01-29 DIAGNOSIS — M5441 Lumbago with sciatica, right side: Secondary | ICD-10-CM | POA: Diagnosis not present

## 2015-02-02 DIAGNOSIS — M5441 Lumbago with sciatica, right side: Secondary | ICD-10-CM | POA: Diagnosis not present

## 2015-02-02 DIAGNOSIS — M5442 Lumbago with sciatica, left side: Secondary | ICD-10-CM | POA: Diagnosis not present

## 2015-02-05 DIAGNOSIS — M5442 Lumbago with sciatica, left side: Secondary | ICD-10-CM | POA: Diagnosis not present

## 2015-02-05 DIAGNOSIS — M5441 Lumbago with sciatica, right side: Secondary | ICD-10-CM | POA: Diagnosis not present

## 2015-02-09 DIAGNOSIS — M5441 Lumbago with sciatica, right side: Secondary | ICD-10-CM | POA: Diagnosis not present

## 2015-02-09 DIAGNOSIS — M5442 Lumbago with sciatica, left side: Secondary | ICD-10-CM | POA: Diagnosis not present

## 2015-02-11 DIAGNOSIS — M5442 Lumbago with sciatica, left side: Secondary | ICD-10-CM | POA: Diagnosis not present

## 2015-02-11 DIAGNOSIS — M5441 Lumbago with sciatica, right side: Secondary | ICD-10-CM | POA: Diagnosis not present

## 2015-02-16 DIAGNOSIS — H6692 Otitis media, unspecified, left ear: Secondary | ICD-10-CM | POA: Diagnosis not present

## 2015-02-16 DIAGNOSIS — H6063 Unspecified chronic otitis externa, bilateral: Secondary | ICD-10-CM | POA: Diagnosis not present

## 2015-02-16 DIAGNOSIS — H6122 Impacted cerumen, left ear: Secondary | ICD-10-CM | POA: Diagnosis not present

## 2015-02-17 DIAGNOSIS — M5442 Lumbago with sciatica, left side: Secondary | ICD-10-CM | POA: Diagnosis not present

## 2015-02-17 DIAGNOSIS — M5441 Lumbago with sciatica, right side: Secondary | ICD-10-CM | POA: Diagnosis not present

## 2015-02-18 DIAGNOSIS — M5441 Lumbago with sciatica, right side: Secondary | ICD-10-CM | POA: Diagnosis not present

## 2015-02-18 DIAGNOSIS — M5442 Lumbago with sciatica, left side: Secondary | ICD-10-CM | POA: Diagnosis not present

## 2015-02-25 DIAGNOSIS — H6123 Impacted cerumen, bilateral: Secondary | ICD-10-CM | POA: Diagnosis not present

## 2015-02-25 DIAGNOSIS — H6062 Unspecified chronic otitis externa, left ear: Secondary | ICD-10-CM | POA: Diagnosis not present

## 2015-03-06 DIAGNOSIS — R490 Dysphonia: Secondary | ICD-10-CM | POA: Diagnosis not present

## 2015-03-17 DIAGNOSIS — R490 Dysphonia: Secondary | ICD-10-CM | POA: Diagnosis not present

## 2015-03-20 DIAGNOSIS — E780 Pure hypercholesterolemia: Secondary | ICD-10-CM | POA: Diagnosis not present

## 2015-03-20 DIAGNOSIS — N390 Urinary tract infection, site not specified: Secondary | ICD-10-CM | POA: Diagnosis not present

## 2015-03-20 DIAGNOSIS — R7301 Impaired fasting glucose: Secondary | ICD-10-CM | POA: Diagnosis not present

## 2015-03-20 DIAGNOSIS — M810 Age-related osteoporosis without current pathological fracture: Secondary | ICD-10-CM | POA: Diagnosis not present

## 2015-03-20 DIAGNOSIS — Z0000 Encounter for general adult medical examination without abnormal findings: Secondary | ICD-10-CM | POA: Diagnosis not present

## 2015-03-20 DIAGNOSIS — I10 Essential (primary) hypertension: Secondary | ICD-10-CM | POA: Diagnosis not present

## 2015-03-26 DIAGNOSIS — J45909 Unspecified asthma, uncomplicated: Secondary | ICD-10-CM | POA: Diagnosis not present

## 2015-03-26 DIAGNOSIS — Z23 Encounter for immunization: Secondary | ICD-10-CM | POA: Diagnosis not present

## 2015-03-26 DIAGNOSIS — M4806 Spinal stenosis, lumbar region: Secondary | ICD-10-CM | POA: Diagnosis not present

## 2015-03-26 DIAGNOSIS — E780 Pure hypercholesterolemia: Secondary | ICD-10-CM | POA: Diagnosis not present

## 2015-03-26 DIAGNOSIS — I10 Essential (primary) hypertension: Secondary | ICD-10-CM | POA: Diagnosis not present

## 2015-03-26 DIAGNOSIS — E669 Obesity, unspecified: Secondary | ICD-10-CM | POA: Diagnosis not present

## 2015-03-26 DIAGNOSIS — R7301 Impaired fasting glucose: Secondary | ICD-10-CM | POA: Diagnosis not present

## 2015-03-26 DIAGNOSIS — Z6830 Body mass index (BMI) 30.0-30.9, adult: Secondary | ICD-10-CM | POA: Diagnosis not present

## 2015-03-26 DIAGNOSIS — Z0000 Encounter for general adult medical examination without abnormal findings: Secondary | ICD-10-CM | POA: Diagnosis not present

## 2015-03-26 DIAGNOSIS — M810 Age-related osteoporosis without current pathological fracture: Secondary | ICD-10-CM | POA: Diagnosis not present

## 2015-03-26 DIAGNOSIS — Z1389 Encounter for screening for other disorder: Secondary | ICD-10-CM | POA: Diagnosis not present

## 2015-03-26 DIAGNOSIS — D649 Anemia, unspecified: Secondary | ICD-10-CM | POA: Diagnosis not present

## 2015-03-27 DIAGNOSIS — Z1212 Encounter for screening for malignant neoplasm of rectum: Secondary | ICD-10-CM | POA: Diagnosis not present

## 2015-04-06 DIAGNOSIS — J45909 Unspecified asthma, uncomplicated: Secondary | ICD-10-CM | POA: Diagnosis not present

## 2015-04-06 DIAGNOSIS — Z6830 Body mass index (BMI) 30.0-30.9, adult: Secondary | ICD-10-CM | POA: Diagnosis not present

## 2015-04-06 DIAGNOSIS — R490 Dysphonia: Secondary | ICD-10-CM | POA: Diagnosis not present

## 2015-04-06 DIAGNOSIS — D649 Anemia, unspecified: Secondary | ICD-10-CM | POA: Diagnosis not present

## 2015-04-09 ENCOUNTER — Encounter (HOSPITAL_COMMUNITY): Payer: Self-pay | Admitting: Emergency Medicine

## 2015-04-09 ENCOUNTER — Emergency Department (HOSPITAL_COMMUNITY)
Admission: EM | Admit: 2015-04-09 | Discharge: 2015-04-09 | Disposition: A | Discharge status: Home / Self Care | Payer: Medicare Other | Attending: Emergency Medicine | Admitting: Emergency Medicine

## 2015-04-09 ENCOUNTER — Emergency Department (HOSPITAL_COMMUNITY): Payer: Medicare Other

## 2015-04-09 DIAGNOSIS — H409 Unspecified glaucoma: Secondary | ICD-10-CM | POA: Diagnosis not present

## 2015-04-09 DIAGNOSIS — Z7951 Long term (current) use of inhaled steroids: Secondary | ICD-10-CM | POA: Diagnosis not present

## 2015-04-09 DIAGNOSIS — Z880 Allergy status to penicillin: Secondary | ICD-10-CM | POA: Insufficient documentation

## 2015-04-09 DIAGNOSIS — Z862 Personal history of diseases of the blood and blood-forming organs and certain disorders involving the immune mechanism: Secondary | ICD-10-CM | POA: Insufficient documentation

## 2015-04-09 DIAGNOSIS — Z7982 Long term (current) use of aspirin: Secondary | ICD-10-CM | POA: Insufficient documentation

## 2015-04-09 DIAGNOSIS — J040 Acute laryngitis: Secondary | ICD-10-CM | POA: Insufficient documentation

## 2015-04-09 DIAGNOSIS — K219 Gastro-esophageal reflux disease without esophagitis: Secondary | ICD-10-CM | POA: Diagnosis not present

## 2015-04-09 DIAGNOSIS — J159 Unspecified bacterial pneumonia: Secondary | ICD-10-CM | POA: Insufficient documentation

## 2015-04-09 DIAGNOSIS — J45901 Unspecified asthma with (acute) exacerbation: Secondary | ICD-10-CM | POA: Diagnosis not present

## 2015-04-09 DIAGNOSIS — R0600 Dyspnea, unspecified: Secondary | ICD-10-CM | POA: Diagnosis not present

## 2015-04-09 DIAGNOSIS — M1990 Unspecified osteoarthritis, unspecified site: Secondary | ICD-10-CM | POA: Diagnosis not present

## 2015-04-09 DIAGNOSIS — R9431 Abnormal electrocardiogram [ECG] [EKG]: Secondary | ICD-10-CM | POA: Diagnosis not present

## 2015-04-09 DIAGNOSIS — E871 Hypo-osmolality and hyponatremia: Secondary | ICD-10-CM

## 2015-04-09 DIAGNOSIS — J189 Pneumonia, unspecified organism: Secondary | ICD-10-CM | POA: Diagnosis not present

## 2015-04-09 DIAGNOSIS — Z8701 Personal history of pneumonia (recurrent): Secondary | ICD-10-CM | POA: Diagnosis not present

## 2015-04-09 DIAGNOSIS — Z8659 Personal history of other mental and behavioral disorders: Secondary | ICD-10-CM | POA: Insufficient documentation

## 2015-04-09 DIAGNOSIS — M810 Age-related osteoporosis without current pathological fracture: Secondary | ICD-10-CM | POA: Diagnosis not present

## 2015-04-09 DIAGNOSIS — R0602 Shortness of breath: Secondary | ICD-10-CM | POA: Diagnosis present

## 2015-04-09 LAB — BASIC METABOLIC PANEL
ANION GAP: 5 (ref 5–15)
BUN: 13 mg/dL (ref 6–20)
CO2: 27 mmol/L (ref 22–32)
Calcium: 9.8 mg/dL (ref 8.9–10.3)
Chloride: 98 mmol/L — ABNORMAL LOW (ref 101–111)
Creatinine, Ser: 0.94 mg/dL (ref 0.44–1.00)
GFR calc non Af Amer: 53 mL/min — ABNORMAL LOW (ref >60)
Glucose, Bld: 109 mg/dL — ABNORMAL HIGH (ref 70–99)
POTASSIUM: 4.3 mmol/L (ref 3.5–5.1)
SODIUM: 130 mmol/L — AB (ref 135–145)

## 2015-04-09 LAB — CBC WITH DIFFERENTIAL/PLATELET
BASOS ABS: 0 10*3/uL (ref 0.0–0.1)
Basophils Relative: 0 % (ref 0–1)
EOS PCT: 11 % — AB (ref 0–5)
Eosinophils Absolute: 0.9 10*3/uL — ABNORMAL HIGH (ref 0.0–0.7)
HCT: 27.8 % — ABNORMAL LOW (ref 36.0–46.0)
Hemoglobin: 9 g/dL — ABNORMAL LOW (ref 12.0–15.0)
LYMPHS ABS: 1.7 10*3/uL (ref 0.7–4.0)
LYMPHS PCT: 20 % (ref 12–46)
MCH: 26.2 pg (ref 26.0–34.0)
MCHC: 32.4 g/dL (ref 30.0–36.0)
MCV: 81 fL (ref 78.0–100.0)
MONO ABS: 0.8 10*3/uL (ref 0.1–1.0)
Monocytes Relative: 10 % (ref 3–12)
NEUTROS ABS: 4.8 10*3/uL (ref 1.7–7.7)
NEUTROS PCT: 59 % (ref 43–77)
PLATELETS: 321 10*3/uL (ref 150–400)
RBC: 3.43 MIL/uL — AB (ref 3.87–5.11)
RDW: 14.5 % (ref 11.5–15.5)
WBC: 8.2 10*3/uL (ref 4.0–10.5)

## 2015-04-09 MED ORDER — IPRATROPIUM-ALBUTEROL 0.5-2.5 (3) MG/3ML IN SOLN
3.0000 mL | Freq: Once | RESPIRATORY_TRACT | Status: AC
  Administered 2015-04-09: 3 mL via RESPIRATORY_TRACT
  Filled 2015-04-09: qty 3

## 2015-04-09 MED ORDER — AZITHROMYCIN 250 MG PO TABS
500.0000 mg | ORAL_TABLET | Freq: Once | ORAL | Status: AC
  Administered 2015-04-09: 500 mg via ORAL
  Filled 2015-04-09: qty 2

## 2015-04-09 MED ORDER — AZITHROMYCIN 250 MG PO TABS: 250.0000 mg | ORAL_TABLET | Freq: Every day | ORAL | Status: DC

## 2015-04-09 NOTE — ED Provider Notes (Signed)
CSN: [id number]     Arrival date & time 04/09/15  0000 History   First MD Initiated Contact with Patient 04/09/15 0019     Chief Complaint  Patient presents with  . Shortness of Breath     (Consider location/radiation/quality/duration/timing/severity/associated sxs/prior Treatment) HPI  81 year old female presents to the emergency department from home with complaint of elevated blood pressure, cough and worsening shortness of breath.  Patient reports she's had ongoing cough, shortness of breath and hoarseness for the last 3 weeks.  She has been seen by her primary care doctor and by ENT.  She reports that the cough worsened today.  She checked her blood pressure and her temperature.  She reports her blood pressure read high 190s over 100s.  She got concerned and decided to come to the emergency department.  Patient reports that she has history of asthma, recently changed to an inhaled steroid along with her albuterol.  She has been using it.  She denies any fevers or chills.  Cough is nonproductive. She reports that she recently has been spraying around her house due to lizards, and has been inhaling some fumes from the past control. She was put on steroids by ENT  , but reports no real improvement in her hoarseness.  Past Medical History  Diagnosis Date  . Shortness of breath   . GERD (gastroesophageal reflux disease)   . High cholesterol   . Arthritis     shoulder and knee pain  . Anemia   . Osteoporosis   . Asthma   . Myalgia   . Glaucoma     Hx; of  . Depression     Hx; of situational depression  . Pneumonia     Hx: of "years ago"  . Numbness and tingling     Hx: of B/LLE  . Asthma    Past Surgical History  Procedure Laterality Date  . 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 Back surgery    . Cholecystectomy    . Hernia repair    . Appendectomy    . Eye surgery      catarac  . Vascular surgery      varicose vein surgery  . Colonoscopy      Hx: of  . Lumbar  laminectomy/decompression microdiscectomy Left 07/05/2013    Procedure: LUMBAR LAMINECTOMY/DECOMPRESSION MICRODISCECTOMY 1 LEVEL Left Lumbar five-Sacral one;  Surgeon: Charlie Pitter, MD;  Location: [facility];  Service: Neurosurgery;  Laterality: Left;  Left Lumbar Five-Sacral One Microdiskectomy   Family History  Problem Relation Age of Onset  . Hypertension Other   . Heart disease Other   . Glaucoma Other   . Diabetes Other   . Varicose Veins Mother   . Diabetes Brother   . Heart disease Brother   . Hypertension Brother   . Varicose Veins Brother   . Varicose Veins Sister   . Varicose Veins Daughter    History  Substance Use Topics  . Smoking status: Never Smoker   . Smokeless tobacco: Never Used  . Alcohol Use: No   OB History    No data available     Review of Systems  HENT: Positive for voice change.   Respiratory: Positive for cough.       Allergies  Macrodantin; Amoxicillin; and Sulfa antibiotics  Home Medications   Prior to Admission medications   Medication Sig Start Date End Date Taking? Authorizing Provider  acetaminophen (TYLENOL) 500 MG tablet  Take 500 mg by mouth as needed.   Yes Historical Provider, MD  albuterol (PROVENTIL HFA;VENTOLIN HFA) 108 (90 BASE) MCG/ACT inhaler Inhale 2 puffs into the lungs every 6 (six) hours as needed for wheezing or shortness of breath.    Yes Historical Provider, MD  aspirin EC 81 MG tablet Take 81 mg by mouth daily.   Yes Historical Provider, MD  bimatoprost (LUMIGAN) 0.01 % SOLN Place 1 drop into both eyes at bedtime.   Yes Historical Provider, MD  Calcium Carbonate-Vit D-Min (CALTRATE PLUS PO) Take 1,000 mg by mouth 2 (two) times daily.   Yes Historical Provider, MD  cholecalciferol (VITAMIN D) 1000 UNITS tablet Take 1,000 Units by mouth daily.   Yes Historical Provider, MD  Cyanocobalamin (VITAMIN B-12 PO) Place 1 tablet under the tongue daily.   Yes Historical Provider, MD  cyclobenzaprine (FLEXERIL) 10 MG tablet Take  0.5 tablets (5 mg total) by mouth 3 (three) times daily as needed for muscle spasms. 07/06/13  Yes Newman Pies, MD  esomeprazole (NEXIUM) 40 MG capsule Take 40 mg by mouth daily before breakfast.   Yes Historical Provider, MD  fish oil-omega-3 fatty acids 1000 MG capsule Take 2 g by mouth 2 (two) times daily.    Yes Historical Provider, MD  fluticasone (FLONASE) 50 MCG/ACT nasal spray Place 2 sprays into the nose at bedtime.    Yes Historical Provider, MD  Fluticasone Furoate-Vilanterol 100-25 MCG/INH AEPB Inhale 1 puff into the lungs daily.   Yes Historical Provider, MD  irbesartan (AVAPRO) 150 MG tablet Take 150 mg by mouth daily.   Yes Historical Provider, MD  Loratadine (CLARITIN PO) Take by mouth as needed.   Yes Historical Provider, MD  niacin 500 MG tablet Take 500 mg by mouth daily with breakfast.   Yes Historical Provider, MD  Potassium 99 MG TABS Take 99 mg by mouth daily.   Yes Historical Provider, MD  Red Yeast Rice Extract (RED YEAST RICE PO) Take 2 capsules by mouth at bedtime.   Yes Historical Provider, MD  vitamin C (ASCORBIC ACID) 500 MG tablet Take 500 mg by mouth daily.   Yes Historical Provider, MD  vitamin E 400 UNIT capsule Take 400 Units by mouth daily.   Yes Historical Provider, MD  HYDROcodone-acetaminophen (NORCO/VICODIN) 5-325 MG per tablet Take 1-2 tablets by mouth every 4 (four) hours as needed. Patient not taking: Reported on 04/09/2015 07/06/13   Newman Pies, MD   BP 118/54 mmHg  Pulse 76  Temp(Src) 98 F (36.7 C) (Oral)  Resp 26  SpO2 98% Physical Exam  Constitutional: She is oriented to person, place, and time. She appears well-developed and well-nourished.  Elderly female, no acute distress.  Hoarse voice noted.  HENT:  Head: Normocephalic and atraumatic.  Nose: Nose normal.  Mouth/Throat: Oropharynx is clear and moist.  Eyes: Conjunctivae and EOM are normal. Pupils are equal, round, and reactive to light.  Neck: Normal range of motion. Neck supple. No  JVD present. No tracheal deviation present. No thyromegaly present.  Cardiovascular: Normal rate, regular rhythm, normal heart sounds and intact distal pulses.  Exam reveals no gallop and no friction rub.   No murmur heard. Pulmonary/Chest: Effort normal. No stridor. No respiratory distress. She has wheezes. She has no rales. She exhibits no tenderness.  Abdominal: Soft. Bowel sounds are normal. She exhibits no distension and no mass. There is no tenderness. There is no rebound and no guarding.  Musculoskeletal: Normal range of motion. She exhibits no edema  or tenderness.  Lymphadenopathy:    She has no cervical adenopathy.  Neurological: She is alert and oriented to person, place, and time. She displays normal reflexes. She exhibits normal muscle tone. Coordination normal.  Skin: Skin is warm and dry. No rash noted. No erythema. No pallor.  Psychiatric: She has a normal mood and affect. Her behavior is normal. Judgment and thought content normal.  Nursing note and vitals reviewed.   ED Course  Procedures (including critical care time) Labs Review Labs Reviewed  CBC WITH DIFFERENTIAL/PLATELET - Abnormal; Notable for the following:    RBC 3.43 (*)    Hemoglobin 9.0 (*)    HCT 27.8 (*)    Eosinophils Relative 11 (*)    Eosinophils Absolute 0.9 (*)    All other components within normal limits  BASIC METABOLIC PANEL - Abnormal; Notable for the following:    Sodium 130 (*)    Chloride 98 (*)    Glucose, Bld 109 (*)    GFR calc non Af Amer 53 (*)    All other components within normal limits    Imaging Review Dg Chest 2 View (if Patient Has Fever And/or Copd)  04/09/2015   CLINICAL DATA:  Dyspnea for 24 hours  EXAM: CHEST  2 VIEW  COMPARISON:  03/23/2013  FINDINGS: There probably is a left lower lobe infiltrate which may be infectious. The right lung is clear. There are no effusions. Pulmonary vasculature is normal.  IMPRESSION: Probable left lower lobe infiltrate   Electronically Signed    By: Andreas Newport M.D.   On: 04/09/2015 00:48     EKG Interpretation   Date/Time:  Thursday Apr 09 2015 00:15:56 EDT Ventricular Rate:  79 PR Interval:  157 QRS Duration: 76 QT Interval:  342 QTC Calculation: 392 R Axis:   7 Text Interpretation:  Sinus rhythm Low voltage, precordial leads Abnormal  R-wave progression, early transition Confirmed by Paisyn Guercio  MD, Mabelle Mungin ([id number])  on 04/09/2015 12:57:28 AM      MDM   Final diagnoses:  CAP (community acquired pneumonia)  Hyponatremia  Laryngitis    81 yo female with increased sob, cough tonight, had elevated bp at home.  Here she is not hypertensive.  Wheezing on exam.  CXR with left lower lobe infiltrate, will treat with zithromax.  Duoneb here.  Labs pending.    2:46 AM Patient reports that she feels better.  Hyponatremia noted.  Patient instructed to increase salt intake.  Patient to follow-up with primary care Dr. for recheck.    Linton Flemings, MD 04/09/15 [phone]

## 2015-04-09 NOTE — ED Notes (Signed)
Pt states that she has had a cough but tonight became more SOB. Also states that her BP has been elevated at home.

## 2015-04-09 NOTE — ED Notes (Signed)
Patient transported to X-ray 

## 2015-04-09 NOTE — ED Notes (Signed)
Pt states she feels better after breathing tx.

## 2015-04-09 NOTE — ED Notes (Signed)
Pt ambulated to restroom in no distress.

## 2015-04-09 NOTE — Discharge Instructions (Signed)
Take antibiotic as prescribed.  Increase your salt intake.  Follow-up with your doctor for recheck in 5 days.  Your blood pressure was not elevated here in the emergency department today.  Bring your blood pressure machine into the office with you on your next visit to check it against their machine.  Return to the emergency department for worsening condition or new concerning symptoms.  Try to rest your voice as much as you can   Hyponatremia  Hyponatremia is when the amount of salt (sodium) in your blood is too low. When sodium levels are low, your cells will absorb extra water and swell. The swelling happens throughout the body, but it mostly affects the brain. Severe brain swelling (cerebral edema), seizures, or coma can happen.  CAUSES   Heart, kidney, or liver problems.  Thyroid problems.  Adrenal gland problems.  Severe vomiting and diarrhea.  Certain medicines or illegal drugs.  Dehydration.  Drinking too much water.  Low-sodium diet. SYMPTOMS   Nausea and vomiting.  Confusion.  Lethargy.  Agitation.  Headache.  Twitching or shaking (seizures).  Unconsciousness.  Appetite loss.  Muscle weakness and cramping. DIAGNOSIS  Hyponatremia is identified by a simple blood test. Your caregiver will perform a history and physical exam to try to find the cause and type of hyponatremia. Other tests may be needed to measure the amount of sodium in your blood and urine. TREATMENT  Treatment will depend on the cause.   Fluids may be given through the vein (IV).  Medicines may be used to correct the sodium imbalance. If medicines are causing the problem, they will need to be adjusted.  Water or fluid intake may be restricted to restore proper balance. The speed of correcting the sodium problem is very important. If the problem is corrected too fast, nerve damage (sometimes unchangeable) can happen. HOME CARE INSTRUCTIONS   Only take medicines as directed by your  caregiver. Many medicines can make hyponatremia worse. Discuss all your medicines with your caregiver.  Carefully follow any recommended diet, including any fluid restrictions.  You may be asked to repeat lab tests. Follow these directions.  Avoid alcohol and recreational drugs. SEEK MEDICAL CARE IF:   You develop worsening nausea, fatigue, headache, confusion, or weakness.  Your original hyponatremia symptoms return.  You have problems following the recommended diet. SEEK IMMEDIATE MEDICAL CARE IF:   You have a seizure.  You faint.  You have ongoing diarrhea or vomiting. MAKE SURE YOU:   Understand these instructions.  Will watch your condition.  Will get help right away if you are not doing well or get worse. Document Released: 11/11/2002 Document Revised: 02/13/2012 Document Reviewed: 05/08/2011 [facility] Patient Information 2015 [place], [place]. This information is not intended to replace advice given to you by your health care provider. Make sure you discuss any questions you have with your health care provider.  Laryngitis At the top of your windpipe is your voice box. It is the source of your voice. Inside your voice box are 2 bands of muscles called vocal cords. When you breathe, your vocal cords are relaxed and open so that air can get into the lungs. When you decide to say something, these cords come together and vibrate. The sound from these vibrations goes into your throat and comes out through your mouth as sound. Laryngitis is an inflammation of the vocal cords that causes hoarseness, cough, loss of voice, sore throat, and dry throat. Laryngitis can be temporary (acute) or long-term (chronic). Most  cases of acute laryngitis improve with time.Chronic laryngitis lasts for more than 3 weeks. CAUSES Laryngitis can often be related to excessive smoking, talking, or yelling, as well as inhalation of toxic fumes and allergies. Acute laryngitis is usually caused by a viral  infection, vocal strain, measles or mumps, or bacterial infections. Chronic laryngitis is usually caused by vocal cord strain, vocal cord injury, postnasal drip, growths on the vocal cords, or acid reflux. SYMPTOMS   Cough.  Sore throat.  Dry throat. RISK FACTORS  Respiratory infections.  Exposure to irritating substances, such as cigarette smoke, excessive amounts of alcohol, stomach acids, and workplace chemicals.  Voice trauma, such as vocal cord injury from shouting or speaking too loud. DIAGNOSIS  Your cargiver will perform a physical exam. During the physical exam, your caregiver will examine your throat. The most common sign of laryngitis is hoarseness. Laryngoscopy may be necessary to confirm the diagnosis of this condition. This procedure allows your caregiver to look into the larynx. HOME CARE INSTRUCTIONS  Drink enough fluids to keep your urine clear or pale yellow.  Rest until you no longer have symptoms or as directed by your caregiver.  Breathe in moist air.  Take all medicine as directed by your caregiver.  Do not smoke.  Talk as little as possible (this includes whispering).  Write on paper instead of talking until your voice is back to normal.  Follow up with your caregiver if your condition has not improved after 10 days. SEEK MEDICAL CARE IF:   You have trouble breathing.  You cough up blood.  You have persistent fever.  You have increasing pain.  You have difficulty swallowing. MAKE SURE YOU:  Understand these instructions.  Will watch your condition.  Will get help right away if you are not doing well or get worse. Document Released: 11/21/2005 Document Revised: 02/13/2012 Document Reviewed: 01/27/2011 [facility] Patient Information 2015 [place], [place]. This information is not intended to replace advice given to you by your health care provider. Make sure you discuss any questions you have with your health care provider.  Pneumonia Pneumonia  is an infection of the lungs.  CAUSES Pneumonia may be caused by bacteria or a virus. Usually, these infections are caused by breathing infectious particles into the lungs (respiratory tract). SIGNS AND SYMPTOMS   Cough.  Fever.  Chest pain.  Increased rate of breathing.  Wheezing.  Mucus production. DIAGNOSIS  If you have the common symptoms of pneumonia, your health care provider will typically confirm the diagnosis with a chest X-ray. The X-ray will show an abnormality in the lung (pulmonary infiltrate) if you have pneumonia. Other tests of your blood, urine, or sputum may be done to find the specific cause of your pneumonia. Your health care provider may also do tests (blood gases or pulse oximetry) to see how well your lungs are working. TREATMENT  Some forms of pneumonia may be spread to other people when you cough or sneeze. You may be asked to wear a mask before and during your exam. Pneumonia that is caused by bacteria is treated with antibiotic medicine. Pneumonia that is caused by the influenza virus may be treated with an antiviral medicine. Most other viral infections must run their course. These infections will not respond to antibiotics.  HOME CARE INSTRUCTIONS   Cough suppressants may be used if you are losing too much rest. However, coughing protects you by clearing your lungs. You should avoid using cough suppressants if you can.  Your health care  provider may have prescribed medicine if he or she thinks your pneumonia is caused by bacteria or influenza. Finish your medicine even if you start to feel better.  Your health care provider may also prescribe an expectorant. This loosens the mucus to be coughed up.  Take medicines only as directed by your health care provider.  Do not smoke. Smoking is a common cause of bronchitis and can contribute to pneumonia. If you are a smoker and continue to smoke, your cough may last several weeks after your pneumonia has  cleared.  A cold steam vaporizer or humidifier in your room or home may help loosen mucus.  Coughing is often worse at night. Sleeping in a semi-upright position in a recliner or using a couple pillows under your head will help with this.  Get rest as you feel it is needed. Your body will usually let you know when you need to rest. PREVENTION A pneumococcal shot (vaccine) is available to prevent a common bacterial cause of pneumonia. This is usually suggested for:  People over 73 years old.  Patients on chemotherapy.  People with chronic lung problems, such as bronchitis or emphysema.  People with immune system problems. If you are over 65 or have a high risk condition, you may receive the pneumococcal vaccine if you have not received it before. In some countries, a routine influenza vaccine is also recommended. This vaccine can help prevent some cases of pneumonia.You may be offered the influenza vaccine as part of your care. If you smoke, it is time to quit. You may receive instructions on how to stop smoking. Your health care provider can provide medicines and counseling to help you quit. SEEK MEDICAL CARE IF: You have a fever. SEEK IMMEDIATE MEDICAL CARE IF:   Your illness becomes worse. This is especially true if you are elderly or weakened from any other disease.  You cannot control your cough with suppressants and are losing sleep.  You begin coughing up blood.  You develop pain which is getting worse or is uncontrolled with medicines.  Any of the symptoms which initially brought you in for treatment are getting worse rather than better.  You develop shortness of breath or chest pain. MAKE SURE YOU:   Understand these instructions.  Will watch your condition.  Will get help right away if you are not doing well or get worse. Document Released: 11/21/2005 Document Revised: 04/07/2014 Document Reviewed: 02/10/2011 [facility] Patient Information 2015 [place], [place]. This  information is not intended to replace advice given to you by your health care provider. Make sure you discuss any questions you have with your health care provider.

## 2015-04-13 DIAGNOSIS — J309 Allergic rhinitis, unspecified: Secondary | ICD-10-CM | POA: Diagnosis not present

## 2015-04-13 DIAGNOSIS — J189 Pneumonia, unspecified organism: Secondary | ICD-10-CM | POA: Diagnosis not present

## 2015-04-13 DIAGNOSIS — E871 Hypo-osmolality and hyponatremia: Secondary | ICD-10-CM | POA: Diagnosis not present

## 2015-04-13 DIAGNOSIS — Z6830 Body mass index (BMI) 30.0-30.9, adult: Secondary | ICD-10-CM | POA: Diagnosis not present

## 2015-04-13 DIAGNOSIS — J45909 Unspecified asthma, uncomplicated: Secondary | ICD-10-CM | POA: Diagnosis not present

## 2015-04-20 DIAGNOSIS — R490 Dysphonia: Secondary | ICD-10-CM | POA: Diagnosis not present

## 2015-05-11 ENCOUNTER — Other Ambulatory Visit: Payer: Self-pay

## 2015-05-11 DIAGNOSIS — Z1231 Encounter for screening mammogram for malignant neoplasm of breast: Secondary | ICD-10-CM

## 2015-05-11 DIAGNOSIS — Z6830 Body mass index (BMI) 30.0-30.9, adult: Secondary | ICD-10-CM | POA: Diagnosis not present

## 2015-05-11 DIAGNOSIS — R490 Dysphonia: Secondary | ICD-10-CM | POA: Diagnosis not present

## 2015-05-11 DIAGNOSIS — J45909 Unspecified asthma, uncomplicated: Secondary | ICD-10-CM | POA: Diagnosis not present

## 2015-05-11 DIAGNOSIS — J189 Pneumonia, unspecified organism: Secondary | ICD-10-CM | POA: Diagnosis not present

## 2015-06-01 DIAGNOSIS — M4806 Spinal stenosis, lumbar region: Secondary | ICD-10-CM | POA: Diagnosis not present

## 2015-06-01 DIAGNOSIS — M5416 Radiculopathy, lumbar region: Secondary | ICD-10-CM | POA: Diagnosis not present

## 2015-06-17 DIAGNOSIS — I10 Essential (primary) hypertension: Secondary | ICD-10-CM | POA: Diagnosis not present

## 2015-06-17 DIAGNOSIS — Z6829 Body mass index (BMI) 29.0-29.9, adult: Secondary | ICD-10-CM | POA: Diagnosis not present

## 2015-06-18 ENCOUNTER — Ambulatory Visit
Admission: RE | Admit: 2015-06-18 | Discharge: 2015-06-18 | Disposition: A | Discharge status: Home / Self Care | Payer: Medicare Other | Source: Ambulatory Visit

## 2015-06-18 DIAGNOSIS — Z1231 Encounter for screening mammogram for malignant neoplasm of breast: Secondary | ICD-10-CM | POA: Diagnosis not present

## 2015-06-19 DIAGNOSIS — M4806 Spinal stenosis, lumbar region: Secondary | ICD-10-CM | POA: Diagnosis not present

## 2015-06-24 DIAGNOSIS — M4806 Spinal stenosis, lumbar region: Secondary | ICD-10-CM | POA: Diagnosis not present

## 2015-06-24 DIAGNOSIS — M5127 Other intervertebral disc displacement, lumbosacral region: Secondary | ICD-10-CM | POA: Diagnosis not present

## 2015-06-24 DIAGNOSIS — M47816 Spondylosis without myelopathy or radiculopathy, lumbar region: Secondary | ICD-10-CM | POA: Diagnosis not present

## 2015-06-24 DIAGNOSIS — M5126 Other intervertebral disc displacement, lumbar region: Secondary | ICD-10-CM | POA: Diagnosis not present

## 2015-06-30 DIAGNOSIS — H6123 Impacted cerumen, bilateral: Secondary | ICD-10-CM | POA: Diagnosis not present

## 2015-06-30 DIAGNOSIS — R490 Dysphonia: Secondary | ICD-10-CM | POA: Diagnosis not present

## 2015-07-07 DIAGNOSIS — H4011X1 Primary open-angle glaucoma, mild stage: Secondary | ICD-10-CM | POA: Diagnosis not present

## 2015-07-22 DIAGNOSIS — I10 Essential (primary) hypertension: Secondary | ICD-10-CM | POA: Diagnosis not present

## 2015-07-22 DIAGNOSIS — M4806 Spinal stenosis, lumbar region: Secondary | ICD-10-CM | POA: Diagnosis not present

## 2015-07-22 DIAGNOSIS — Z6829 Body mass index (BMI) 29.0-29.9, adult: Secondary | ICD-10-CM | POA: Diagnosis not present

## 2015-09-04 ENCOUNTER — Telehealth: Payer: Self-pay

## 2015-09-04 NOTE — Telephone Encounter (Signed)
Phone call from pt.  Requesting an appt. To evaluate her legs.  Reported her legs "sting and burn."  Also, stated the left heel and lateral side of the left foot feel "numb."  Reported intermittent pain in the (L) ankle, that comes and goes quickly.  Denied change in temperature of left foot.  Reported there is a line on the side of the foot, that is "a little darker."  Denied any open sores of lower extremities. Stated she as "a little swelling in the left ankle."  Will discuss with MD for recommendation.

## 2015-09-04 NOTE — Telephone Encounter (Signed)
Discussed pt's symptoms with Dr. Bridgett Larsson.  Recommended pt. should see her PCP for evaluation of symptoms.  Notified pt. of Dr. Lianne Moris recommendations.  Verb. Understanding.

## 2015-09-07 DIAGNOSIS — I10 Essential (primary) hypertension: Secondary | ICD-10-CM | POA: Diagnosis not present

## 2015-09-07 DIAGNOSIS — E871 Hypo-osmolality and hyponatremia: Secondary | ICD-10-CM | POA: Diagnosis not present

## 2015-09-07 DIAGNOSIS — M791 Myalgia: Secondary | ICD-10-CM | POA: Diagnosis not present

## 2015-09-07 DIAGNOSIS — M5416 Radiculopathy, lumbar region: Secondary | ICD-10-CM | POA: Diagnosis not present

## 2015-09-07 DIAGNOSIS — Z6829 Body mass index (BMI) 29.0-29.9, adult: Secondary | ICD-10-CM | POA: Diagnosis not present

## 2015-09-22 DIAGNOSIS — M791 Myalgia: Secondary | ICD-10-CM | POA: Diagnosis not present

## 2015-09-22 DIAGNOSIS — R7301 Impaired fasting glucose: Secondary | ICD-10-CM | POA: Diagnosis not present

## 2015-09-22 DIAGNOSIS — Z6829 Body mass index (BMI) 29.0-29.9, adult: Secondary | ICD-10-CM | POA: Diagnosis not present

## 2015-09-22 DIAGNOSIS — I10 Essential (primary) hypertension: Secondary | ICD-10-CM | POA: Diagnosis not present

## 2015-09-22 DIAGNOSIS — M5416 Radiculopathy, lumbar region: Secondary | ICD-10-CM | POA: Diagnosis not present

## 2015-09-22 DIAGNOSIS — E871 Hypo-osmolality and hyponatremia: Secondary | ICD-10-CM | POA: Diagnosis not present

## 2015-09-22 DIAGNOSIS — Z23 Encounter for immunization: Secondary | ICD-10-CM | POA: Diagnosis not present

## 2015-09-22 DIAGNOSIS — R700 Elevated erythrocyte sedimentation rate: Secondary | ICD-10-CM | POA: Diagnosis not present

## 2015-09-22 DIAGNOSIS — R748 Abnormal levels of other serum enzymes: Secondary | ICD-10-CM | POA: Diagnosis not present

## 2015-09-23 DIAGNOSIS — M4806 Spinal stenosis, lumbar region: Secondary | ICD-10-CM | POA: Diagnosis not present

## 2015-09-23 DIAGNOSIS — I10 Essential (primary) hypertension: Secondary | ICD-10-CM | POA: Diagnosis not present

## 2015-09-23 DIAGNOSIS — Z6827 Body mass index (BMI) 27.0-27.9, adult: Secondary | ICD-10-CM | POA: Diagnosis not present

## 2015-10-20 DIAGNOSIS — H60333 Swimmer's ear, bilateral: Secondary | ICD-10-CM | POA: Diagnosis not present

## 2015-10-20 DIAGNOSIS — J310 Chronic rhinitis: Secondary | ICD-10-CM | POA: Diagnosis not present

## 2015-10-20 DIAGNOSIS — H6123 Impacted cerumen, bilateral: Secondary | ICD-10-CM | POA: Diagnosis not present

## 2015-11-17 DIAGNOSIS — Z6829 Body mass index (BMI) 29.0-29.9, adult: Secondary | ICD-10-CM | POA: Diagnosis not present

## 2015-11-17 DIAGNOSIS — M810 Age-related osteoporosis without current pathological fracture: Secondary | ICD-10-CM | POA: Diagnosis not present

## 2015-11-17 DIAGNOSIS — Z79899 Other long term (current) drug therapy: Secondary | ICD-10-CM | POA: Diagnosis not present

## 2015-12-01 ENCOUNTER — Other Ambulatory Visit (HOSPITAL_COMMUNITY): Payer: Self-pay | Admitting: Internal Medicine

## 2015-12-03 ENCOUNTER — Ambulatory Visit (HOSPITAL_COMMUNITY): Admission: RE | Admit: 2015-12-03 | Payer: Medicare Other | Source: Ambulatory Visit

## 2015-12-15 ENCOUNTER — Encounter (HOSPITAL_COMMUNITY): Payer: Medicare Other

## 2015-12-17 ENCOUNTER — Encounter (HOSPITAL_COMMUNITY): Payer: Self-pay

## 2015-12-17 ENCOUNTER — Encounter (HOSPITAL_COMMUNITY)
Admission: RE | Admit: 2015-12-17 | Discharge: 2015-12-17 | Disposition: A | Discharge status: Home / Self Care | Payer: Medicare Other | Source: Ambulatory Visit | Attending: Internal Medicine | Admitting: Internal Medicine

## 2015-12-17 ENCOUNTER — Other Ambulatory Visit (HOSPITAL_COMMUNITY): Payer: Self-pay | Admitting: Internal Medicine

## 2015-12-17 DIAGNOSIS — M549 Dorsalgia, unspecified: Secondary | ICD-10-CM | POA: Diagnosis not present

## 2015-12-17 DIAGNOSIS — M810 Age-related osteoporosis without current pathological fracture: Secondary | ICD-10-CM | POA: Diagnosis not present

## 2015-12-17 DIAGNOSIS — J45909 Unspecified asthma, uncomplicated: Secondary | ICD-10-CM | POA: Diagnosis not present

## 2015-12-17 MED ORDER — ZOLEDRONIC ACID 5 MG/100ML IV SOLN
5.0000 mg | Freq: Once | INTRAVENOUS | Status: AC
  Administered 2015-12-17: 5 mg via INTRAVENOUS
  Filled 2015-12-17: qty 100

## 2015-12-17 MED ORDER — SODIUM CHLORIDE 0.9 % IV SOLN
Freq: Once | INTRAVENOUS | Status: AC
  Administered 2015-12-17: 14:00:00 via INTRAVENOUS

## 2015-12-17 NOTE — Discharge Instructions (Signed)

## 2015-12-23 DIAGNOSIS — M4806 Spinal stenosis, lumbar region: Secondary | ICD-10-CM | POA: Diagnosis not present

## 2015-12-23 DIAGNOSIS — Z6827 Body mass index (BMI) 27.0-27.9, adult: Secondary | ICD-10-CM | POA: Diagnosis not present

## 2015-12-23 DIAGNOSIS — I10 Essential (primary) hypertension: Secondary | ICD-10-CM | POA: Diagnosis not present

## 2015-12-28 DIAGNOSIS — H60332 Swimmer's ear, left ear: Secondary | ICD-10-CM | POA: Diagnosis not present

## 2015-12-28 DIAGNOSIS — H6122 Impacted cerumen, left ear: Secondary | ICD-10-CM | POA: Diagnosis not present

## 2015-12-28 DIAGNOSIS — J018 Other acute sinusitis: Secondary | ICD-10-CM | POA: Diagnosis not present

## 2016-01-01 DIAGNOSIS — R05 Cough: Secondary | ICD-10-CM | POA: Diagnosis not present

## 2016-01-01 DIAGNOSIS — J209 Acute bronchitis, unspecified: Secondary | ICD-10-CM | POA: Diagnosis not present

## 2016-01-01 DIAGNOSIS — J45909 Unspecified asthma, uncomplicated: Secondary | ICD-10-CM | POA: Diagnosis not present

## 2016-01-01 DIAGNOSIS — Z6829 Body mass index (BMI) 29.0-29.9, adult: Secondary | ICD-10-CM | POA: Diagnosis not present

## 2016-01-20 DIAGNOSIS — H40003 Preglaucoma, unspecified, bilateral: Secondary | ICD-10-CM | POA: Diagnosis not present

## 2016-01-20 DIAGNOSIS — H04123 Dry eye syndrome of bilateral lacrimal glands: Secondary | ICD-10-CM | POA: Diagnosis not present

## 2016-01-20 DIAGNOSIS — H26493 Other secondary cataract, bilateral: Secondary | ICD-10-CM | POA: Diagnosis not present

## 2016-03-10 DIAGNOSIS — D447 Neoplasm of uncertain behavior of aortic body and other paraganglia: Secondary | ICD-10-CM | POA: Diagnosis not present

## 2016-03-10 DIAGNOSIS — D1800 Hemangioma unspecified site: Secondary | ICD-10-CM | POA: Diagnosis not present

## 2016-03-11 DIAGNOSIS — H26493 Other secondary cataract, bilateral: Secondary | ICD-10-CM | POA: Diagnosis not present

## 2016-03-17 DIAGNOSIS — H40003 Preglaucoma, unspecified, bilateral: Secondary | ICD-10-CM | POA: Diagnosis not present

## 2016-03-22 DIAGNOSIS — Z6829 Body mass index (BMI) 29.0-29.9, adult: Secondary | ICD-10-CM | POA: Diagnosis not present

## 2016-03-22 DIAGNOSIS — M79641 Pain in right hand: Secondary | ICD-10-CM | POA: Diagnosis not present

## 2016-03-31 DIAGNOSIS — R748 Abnormal levels of other serum enzymes: Secondary | ICD-10-CM | POA: Diagnosis not present

## 2016-03-31 DIAGNOSIS — R7301 Impaired fasting glucose: Secondary | ICD-10-CM | POA: Diagnosis not present

## 2016-03-31 DIAGNOSIS — J45998 Other asthma: Secondary | ICD-10-CM | POA: Diagnosis not present

## 2016-03-31 DIAGNOSIS — D6489 Other specified anemias: Secondary | ICD-10-CM | POA: Diagnosis not present

## 2016-03-31 DIAGNOSIS — Z6829 Body mass index (BMI) 29.0-29.9, adult: Secondary | ICD-10-CM | POA: Diagnosis not present

## 2016-03-31 DIAGNOSIS — E7800 Pure hypercholesterolemia, unspecified: Secondary | ICD-10-CM | POA: Diagnosis not present

## 2016-03-31 DIAGNOSIS — Z1389 Encounter for screening for other disorder: Secondary | ICD-10-CM | POA: Diagnosis not present

## 2016-03-31 DIAGNOSIS — I10 Essential (primary) hypertension: Secondary | ICD-10-CM | POA: Diagnosis not present

## 2016-03-31 DIAGNOSIS — M810 Age-related osteoporosis without current pathological fracture: Secondary | ICD-10-CM | POA: Diagnosis not present

## 2016-03-31 DIAGNOSIS — Z0000 Encounter for general adult medical examination without abnormal findings: Secondary | ICD-10-CM | POA: Diagnosis not present

## 2016-03-31 DIAGNOSIS — R700 Elevated erythrocyte sedimentation rate: Secondary | ICD-10-CM | POA: Diagnosis not present

## 2016-04-14 DIAGNOSIS — Z6829 Body mass index (BMI) 29.0-29.9, adult: Secondary | ICD-10-CM | POA: Diagnosis not present

## 2016-04-14 DIAGNOSIS — M5416 Radiculopathy, lumbar region: Secondary | ICD-10-CM | POA: Diagnosis not present

## 2016-04-14 DIAGNOSIS — I8390 Asymptomatic varicose veins of unspecified lower extremity: Secondary | ICD-10-CM | POA: Diagnosis not present

## 2016-04-14 DIAGNOSIS — M5430 Sciatica, unspecified side: Secondary | ICD-10-CM | POA: Diagnosis not present

## 2016-04-14 DIAGNOSIS — M4806 Spinal stenosis, lumbar region: Secondary | ICD-10-CM | POA: Diagnosis not present

## 2016-04-14 DIAGNOSIS — I10 Essential (primary) hypertension: Secondary | ICD-10-CM | POA: Diagnosis not present

## 2016-04-26 DIAGNOSIS — H5213 Myopia, bilateral: Secondary | ICD-10-CM | POA: Diagnosis not present

## 2016-06-22 DIAGNOSIS — H35371 Puckering of macula, right eye: Secondary | ICD-10-CM | POA: Diagnosis not present

## 2016-06-22 DIAGNOSIS — M4806 Spinal stenosis, lumbar region: Secondary | ICD-10-CM | POA: Diagnosis not present

## 2016-06-22 DIAGNOSIS — Z6829 Body mass index (BMI) 29.0-29.9, adult: Secondary | ICD-10-CM | POA: Diagnosis not present

## 2016-06-29 DIAGNOSIS — R1013 Epigastric pain: Secondary | ICD-10-CM | POA: Diagnosis not present

## 2016-06-29 DIAGNOSIS — D6489 Other specified anemias: Secondary | ICD-10-CM | POA: Diagnosis not present

## 2016-06-29 DIAGNOSIS — I10 Essential (primary) hypertension: Secondary | ICD-10-CM | POA: Diagnosis not present

## 2016-06-29 DIAGNOSIS — H8110 Benign paroxysmal vertigo, unspecified ear: Secondary | ICD-10-CM | POA: Diagnosis not present

## 2016-06-29 DIAGNOSIS — Z6830 Body mass index (BMI) 30.0-30.9, adult: Secondary | ICD-10-CM | POA: Diagnosis not present

## 2016-07-04 DIAGNOSIS — R1013 Epigastric pain: Secondary | ICD-10-CM | POA: Diagnosis not present

## 2016-07-04 DIAGNOSIS — R300 Dysuria: Secondary | ICD-10-CM | POA: Diagnosis not present

## 2016-07-04 DIAGNOSIS — Z6830 Body mass index (BMI) 30.0-30.9, adult: Secondary | ICD-10-CM | POA: Diagnosis not present

## 2016-07-06 ENCOUNTER — Encounter (HOSPITAL_COMMUNITY): Payer: Self-pay | Admitting: Emergency Medicine

## 2016-07-06 ENCOUNTER — Emergency Department (HOSPITAL_COMMUNITY)
Admission: EM | Admit: 2016-07-06 | Discharge: 2016-07-06 | Disposition: A | Discharge status: Home / Self Care | Payer: Medicare Other | Attending: Emergency Medicine | Admitting: Emergency Medicine

## 2016-07-06 DIAGNOSIS — Z792 Long term (current) use of antibiotics: Secondary | ICD-10-CM | POA: Insufficient documentation

## 2016-07-06 DIAGNOSIS — L299 Pruritus, unspecified: Secondary | ICD-10-CM | POA: Diagnosis not present

## 2016-07-06 DIAGNOSIS — J45909 Unspecified asthma, uncomplicated: Secondary | ICD-10-CM | POA: Insufficient documentation

## 2016-07-06 DIAGNOSIS — Z79899 Other long term (current) drug therapy: Secondary | ICD-10-CM | POA: Insufficient documentation

## 2016-07-06 DIAGNOSIS — Z7982 Long term (current) use of aspirin: Secondary | ICD-10-CM | POA: Diagnosis not present

## 2016-07-06 NOTE — ED Triage Notes (Signed)
Patient presents for generalized itching. States she started having vaginal itching one week ago, went to PCP, urine was tested and came back negative. States today vaginal itching has stopped, but she has started itching on bilateral arms, back and face. Denies SOB, no difficulty swallowing own secretions, no vocal changes, speaking in complete sentences.

## 2016-07-06 NOTE — ED Provider Notes (Signed)
[facility] DEPT Provider Note   CSN: [id number] Arrival date & time: 07/06/16  1638  First Provider Contact:  First MD Initiated Contact with Patient 07/06/16 1850        History   Chief Complaint Chief Complaint  Patient presents with  . Pruritis    HPI Beverly Jimenez is a 82 y.o. female.  HPI Patient has had itching around her scalp and neck and shoulders for about the past 3 days. She has not developed any rash. She reports that comes and goes. It seems to be more pronounced when she gets excited. She denies history of similar problems. No history of rashes or pruritus. Only new medication was a probiotic. She denies new soaps or cosmetic products. No associated symptoms. Patient reports she saw her doctor about a week ago for some epigastric discomfort and reflux type symptoms. She reports those have resolved now. She states they did some labs last week and told her they were normal. Past Medical History:  Diagnosis Date  . Anemia   . Arthritis    shoulder and knee pain  . Asthma   . Asthma   . Depression    Hx; of situational depression  . GERD (gastroesophageal reflux disease)   . Glaucoma    Hx; of  . High cholesterol   . Myalgia   . Numbness and tingling    Hx: of B/LLE  . Osteoporosis   . Pneumonia    Hx: of "years ago"  . Shortness of breath     Patient Active Problem List   Diagnosis Date Noted  . Left leg pain 01/06/2015  . Varicose veins of both lower extremities with complications 123456  . HNP (herniated nucleus pulposus), lumbar 07/05/2013    Past Surgical History:  Procedure Laterality Date  . APPENDECTOMY    . BACK SURGERY    . CHOLECYSTECTOMY    . COLONOSCOPY     Hx: of  . EYE SURGERY     catarac  . 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 HERNIA REPAIR    . LUMBAR LAMINECTOMY/DECOMPRESSION MICRODISCECTOMY Left 07/05/2013   Procedure: LUMBAR LAMINECTOMY/DECOMPRESSION MICRODISCECTOMY 1 LEVEL Left Lumbar five-Sacral one;   Surgeon: Charlie Pitter, MD;  Location: [facility];  Service: Neurosurgery;  Laterality: Left;  Left Lumbar Five-Sacral One Microdiskectomy  . VASCULAR SURGERY     varicose vein surgery    OB History    No data available       Home Medications    Prior to Admission medications   Medication Sig Start Date End Date Taking? Authorizing Provider  acetaminophen (TYLENOL) 500 MG tablet Take 500 mg by mouth as needed.    Historical Provider, MD  albuterol (PROVENTIL HFA;VENTOLIN HFA) 108 (90 BASE) MCG/ACT inhaler Inhale 2 puffs into the lungs every 6 (six) hours as needed for wheezing or shortness of breath.     Historical Provider, MD  aspirin EC 81 MG tablet Take 81 mg by mouth daily.    Historical Provider, MD  azithromycin (ZITHROMAX) 250 MG tablet Take 1 tablet (250 mg total) by mouth daily. 04/09/15   Linton Flemings, MD  bimatoprost (LUMIGAN) 0.01 % SOLN Place 1 drop into both eyes at bedtime.    Historical Provider, MD  Calcium Carbonate-Vit D-Min (CALTRATE PLUS PO) Take 1,000 mg by mouth 2 (two) times daily.    Historical Provider, MD  cholecalciferol (VITAMIN D) 1000 UNITS tablet Take 1,000 Units by mouth daily.  Historical Provider, MD  Cyanocobalamin (VITAMIN B-12 PO) Place 1 tablet under the tongue daily.    Historical Provider, MD  cyclobenzaprine (FLEXERIL) 10 MG tablet Take 0.5 tablets (5 mg total) by mouth 3 (three) times daily as needed for muscle spasms. 07/06/13   Newman Pies, MD  esomeprazole (NEXIUM) 40 MG capsule Take 40 mg by mouth daily before breakfast.    Historical Provider, MD  fish oil-omega-3 fatty acids 1000 MG capsule Take 2 g by mouth 2 (two) times daily.     Historical Provider, MD  fluticasone (FLONASE) 50 MCG/ACT nasal spray Place 2 sprays into the nose at bedtime.     Historical Provider, MD  Fluticasone Furoate-Vilanterol 100-25 MCG/INH AEPB Inhale 1 puff into the lungs daily.    Historical Provider, MD  Fluticasone-Salmeterol (ADVAIR) 100-50 MCG/DOSE AEPB  Inhale 1 puff into the lungs 2 (two) times daily.    Historical Provider, MD  guaiFENesin (MUCINEX) 600 MG 12 hr tablet Take by mouth once.    Historical Provider, MD  HYDROcodone-acetaminophen (NORCO/VICODIN) 5-325 MG per tablet Take 1-2 tablets by mouth every 4 (four) hours as needed. Patient not taking: Reported on 04/09/2015 07/06/13   Newman Pies, MD  irbesartan (AVAPRO) 150 MG tablet Take 150 mg by mouth daily.    Historical Provider, MD  Loratadine (CLARITIN PO) Take by mouth as needed.    Historical Provider, MD  niacin 500 MG tablet Take 500 mg by mouth daily with breakfast.    Historical Provider, MD  nortriptyline (PAMELOR) 25 MG capsule Take 25 mg by mouth at bedtime.    Historical Provider, MD  Potassium 99 MG TABS Take 99 mg by mouth daily.    Historical Provider, MD  Red Yeast Rice Extract (RED YEAST RICE PO) Take 2 capsules by mouth at bedtime.    Historical Provider, MD  traMADol (ULTRAM) 50 MG tablet Take by mouth every 6 (six) hours as needed.    Historical Provider, MD  vitamin C (ASCORBIC ACID) 500 MG tablet Take 500 mg by mouth daily.    Historical Provider, MD  vitamin E 400 UNIT capsule Take 400 Units by mouth daily.    Historical Provider, MD    Family History Family History  Problem Relation Age of Onset  . Varicose Veins Mother   . Diabetes Brother   . Heart disease Brother   . Hypertension Brother   . Varicose Veins Brother   . Hypertension Other   . Heart disease Other   . Glaucoma Other   . Diabetes Other   . Varicose Veins Sister   . Varicose Veins Daughter     Social History Social History  Substance Use Topics  . Smoking status: Never Smoker  . Smokeless tobacco: Never Used  . Alcohol use No     Allergies   Macrodantin [nitrofurantoin macrocrystal]; Amoxicillin; and Sulfa antibiotics   Review of Systems Review of Systems 10 Systems reviewed and are negative for acute change except as noted in the HPI.   Physical Exam Updated Vital  Signs BP 122/61 (BP Location: Right Arm)   Pulse 71   Temp 98.9 F (37.2 C) (Oral)   Resp 18   SpO2 100%   Physical Exam  Constitutional: She is oriented to person, place, and time. She appears well-developed and well-nourished. No distress.  HENT:  Head: Normocephalic and atraumatic.  Right Ear: External ear normal.  Left Ear: External ear normal.  Nose: Nose normal.  Mouth/Throat: Oropharynx is clear and moist.  Patient is wearing a wig and a wig stocking with an elastic band. Examination of the scalp and skin of the head and the neck is normal. She does not have rash or erythema. Indentation where the elastic has been somewhat tight on her scalp.  Eyes: EOM are normal. Pupils are equal, round, and reactive to light.  Neck: Neck supple.  Cardiovascular: Normal rate, regular rhythm, normal heart sounds and intact distal pulses.   Pulmonary/Chest: Effort normal and breath sounds normal.  Abdominal: Soft. She exhibits no distension.  Musculoskeletal: Normal range of motion. She exhibits no edema.  Neurological: She is alert and oriented to person, place, and time. No cranial nerve deficit. She exhibits normal muscle tone.  Skin: Skin is warm and dry. No rash noted. No erythema.  Psychiatric: She has a normal mood and affect.     ED Treatments / Results  Labs (all labs ordered are listed, but only abnormal results are displayed) Labs Reviewed - No data to display  EKG  EKG Interpretation None       Radiology No results found.  Procedures Procedures (including critical care time)  Medications Ordered in ED Medications - No data to display   Initial Impression / Assessment and Plan / ED Course  I have reviewed the triage vital signs and the nursing notes.  Pertinent labs & imaging results that were available during my care of the patient were reviewed by me and considered in my medical decision making (see chart for details).  Clinical Course     Final Clinical  Impressions(s) / ED Diagnoses   Final diagnoses:  Pruritus   At this time, no evidence of rash present. Symptoms wax and wane. Patient has opted to not take any medication. She will wait an additional 3-7 days to see if symptoms resolve. She is clinically well in appearance appears is counseled to pay close attention to any new cosmetic products she may be using. Also she is advised to pay attention to her wig stocking and waning to determine if perhaps there causing pruritus. She reports seeing her doctor within the last week and having had lab work done that was within normal limits. At this time I do not see indication for further diagnostic workup.  New Prescriptions New Prescriptions   No medications on file     Charlesetta Shanks, MD 07/06/16 1911

## 2016-08-18 DIAGNOSIS — H6122 Impacted cerumen, left ear: Secondary | ICD-10-CM | POA: Diagnosis not present

## 2016-08-18 DIAGNOSIS — H60332 Swimmer's ear, left ear: Secondary | ICD-10-CM | POA: Diagnosis not present

## 2016-08-23 DIAGNOSIS — R1013 Epigastric pain: Secondary | ICD-10-CM | POA: Diagnosis not present

## 2016-08-23 DIAGNOSIS — K219 Gastro-esophageal reflux disease without esophagitis: Secondary | ICD-10-CM | POA: Diagnosis not present

## 2016-08-26 ENCOUNTER — Other Ambulatory Visit: Payer: Self-pay | Admitting: Internal Medicine

## 2016-08-26 ENCOUNTER — Ambulatory Visit
Admission: RE | Admit: 2016-08-26 | Discharge: 2016-08-26 | Disposition: A | Discharge status: Home / Self Care | Payer: Medicare Other | Source: Ambulatory Visit | Attending: Internal Medicine | Admitting: Internal Medicine

## 2016-08-26 DIAGNOSIS — Z1231 Encounter for screening mammogram for malignant neoplasm of breast: Secondary | ICD-10-CM

## 2016-08-31 DIAGNOSIS — H401131 Primary open-angle glaucoma, bilateral, mild stage: Secondary | ICD-10-CM | POA: Diagnosis not present

## 2016-08-31 DIAGNOSIS — Z961 Presence of intraocular lens: Secondary | ICD-10-CM | POA: Diagnosis not present

## 2016-10-03 DIAGNOSIS — M353 Polymyalgia rheumatica: Secondary | ICD-10-CM | POA: Diagnosis not present

## 2016-10-03 DIAGNOSIS — Z6830 Body mass index (BMI) 30.0-30.9, adult: Secondary | ICD-10-CM | POA: Diagnosis not present

## 2016-10-03 DIAGNOSIS — M810 Age-related osteoporosis without current pathological fracture: Secondary | ICD-10-CM | POA: Diagnosis not present

## 2016-10-03 DIAGNOSIS — J45909 Unspecified asthma, uncomplicated: Secondary | ICD-10-CM | POA: Diagnosis not present

## 2016-10-03 DIAGNOSIS — R300 Dysuria: Secondary | ICD-10-CM | POA: Diagnosis not present

## 2016-10-03 DIAGNOSIS — I10 Essential (primary) hypertension: Secondary | ICD-10-CM | POA: Diagnosis not present

## 2016-10-03 DIAGNOSIS — D6489 Other specified anemias: Secondary | ICD-10-CM | POA: Diagnosis not present

## 2016-10-03 DIAGNOSIS — R7301 Impaired fasting glucose: Secondary | ICD-10-CM | POA: Diagnosis not present

## 2016-10-03 DIAGNOSIS — R700 Elevated erythrocyte sedimentation rate: Secondary | ICD-10-CM | POA: Diagnosis not present

## 2016-10-06 DIAGNOSIS — L299 Pruritus, unspecified: Secondary | ICD-10-CM | POA: Diagnosis not present

## 2016-10-15 DIAGNOSIS — Z23 Encounter for immunization: Secondary | ICD-10-CM | POA: Diagnosis not present

## 2016-11-02 DIAGNOSIS — M810 Age-related osteoporosis without current pathological fracture: Secondary | ICD-10-CM | POA: Diagnosis not present

## 2016-11-02 DIAGNOSIS — M48061 Spinal stenosis, lumbar region without neurogenic claudication: Secondary | ICD-10-CM | POA: Diagnosis not present

## 2016-11-02 DIAGNOSIS — E669 Obesity, unspecified: Secondary | ICD-10-CM | POA: Diagnosis not present

## 2016-11-02 DIAGNOSIS — Z6831 Body mass index (BMI) 31.0-31.9, adult: Secondary | ICD-10-CM | POA: Diagnosis not present

## 2016-11-02 DIAGNOSIS — M791 Myalgia: Secondary | ICD-10-CM | POA: Diagnosis not present

## 2016-11-02 DIAGNOSIS — D649 Anemia, unspecified: Secondary | ICD-10-CM | POA: Diagnosis not present

## 2016-11-15 DIAGNOSIS — S3210XA Unspecified fracture of sacrum, initial encounter for closed fracture: Secondary | ICD-10-CM | POA: Diagnosis not present

## 2016-11-15 DIAGNOSIS — I10 Essential (primary) hypertension: Secondary | ICD-10-CM | POA: Diagnosis not present

## 2016-11-15 DIAGNOSIS — M353 Polymyalgia rheumatica: Secondary | ICD-10-CM | POA: Diagnosis not present

## 2016-11-15 DIAGNOSIS — M545 Low back pain: Secondary | ICD-10-CM | POA: Diagnosis not present

## 2016-11-15 DIAGNOSIS — M5137 Other intervertebral disc degeneration, lumbosacral region: Secondary | ICD-10-CM | POA: Diagnosis not present

## 2016-11-15 DIAGNOSIS — Z981 Arthrodesis status: Secondary | ICD-10-CM | POA: Diagnosis not present

## 2016-11-15 DIAGNOSIS — M5136 Other intervertebral disc degeneration, lumbar region: Secondary | ICD-10-CM | POA: Diagnosis not present

## 2016-11-15 DIAGNOSIS — Z6831 Body mass index (BMI) 31.0-31.9, adult: Secondary | ICD-10-CM | POA: Diagnosis not present

## 2016-11-23 DIAGNOSIS — Z6831 Body mass index (BMI) 31.0-31.9, adult: Secondary | ICD-10-CM | POA: Diagnosis not present

## 2016-11-23 DIAGNOSIS — R262 Difficulty in walking, not elsewhere classified: Secondary | ICD-10-CM | POA: Diagnosis not present

## 2016-11-23 DIAGNOSIS — M6281 Muscle weakness (generalized): Secondary | ICD-10-CM | POA: Diagnosis not present

## 2016-11-23 DIAGNOSIS — Z9181 History of falling: Secondary | ICD-10-CM | POA: Diagnosis not present

## 2016-11-23 DIAGNOSIS — I10 Essential (primary) hypertension: Secondary | ICD-10-CM | POA: Diagnosis not present

## 2016-11-23 DIAGNOSIS — M810 Age-related osteoporosis without current pathological fracture: Secondary | ICD-10-CM | POA: Diagnosis not present

## 2016-11-23 DIAGNOSIS — M545 Low back pain: Secondary | ICD-10-CM | POA: Diagnosis not present

## 2016-12-07 DIAGNOSIS — Z6831 Body mass index (BMI) 31.0-31.9, adult: Secondary | ICD-10-CM | POA: Diagnosis not present

## 2016-12-07 DIAGNOSIS — D649 Anemia, unspecified: Secondary | ICD-10-CM | POA: Diagnosis not present

## 2016-12-07 DIAGNOSIS — M4850XD Collapsed vertebra, not elsewhere classified, site unspecified, subsequent encounter for fracture with routine healing: Secondary | ICD-10-CM | POA: Diagnosis not present

## 2016-12-07 DIAGNOSIS — M791 Myalgia: Secondary | ICD-10-CM | POA: Diagnosis not present

## 2016-12-07 DIAGNOSIS — E669 Obesity, unspecified: Secondary | ICD-10-CM | POA: Diagnosis not present

## 2016-12-07 DIAGNOSIS — M810 Age-related osteoporosis without current pathological fracture: Secondary | ICD-10-CM | POA: Diagnosis not present

## 2016-12-07 DIAGNOSIS — M48061 Spinal stenosis, lumbar region without neurogenic claudication: Secondary | ICD-10-CM | POA: Diagnosis not present

## 2016-12-12 DIAGNOSIS — S3210XD Unspecified fracture of sacrum, subsequent encounter for fracture with routine healing: Secondary | ICD-10-CM | POA: Diagnosis not present

## 2016-12-12 DIAGNOSIS — J019 Acute sinusitis, unspecified: Secondary | ICD-10-CM | POA: Diagnosis not present

## 2016-12-12 DIAGNOSIS — H6122 Impacted cerumen, left ear: Secondary | ICD-10-CM | POA: Diagnosis not present

## 2016-12-12 DIAGNOSIS — J3489 Other specified disorders of nose and nasal sinuses: Secondary | ICD-10-CM | POA: Diagnosis not present

## 2016-12-12 DIAGNOSIS — M810 Age-related osteoporosis without current pathological fracture: Secondary | ICD-10-CM | POA: Diagnosis not present

## 2016-12-12 DIAGNOSIS — I10 Essential (primary) hypertension: Secondary | ICD-10-CM | POA: Diagnosis not present

## 2016-12-12 DIAGNOSIS — R490 Dysphonia: Secondary | ICD-10-CM | POA: Diagnosis not present

## 2016-12-12 DIAGNOSIS — Z6831 Body mass index (BMI) 31.0-31.9, adult: Secondary | ICD-10-CM | POA: Diagnosis not present

## 2016-12-12 DIAGNOSIS — J343 Hypertrophy of nasal turbinates: Secondary | ICD-10-CM | POA: Diagnosis not present

## 2016-12-12 DIAGNOSIS — H9212 Otorrhea, left ear: Secondary | ICD-10-CM | POA: Diagnosis not present

## 2016-12-15 DIAGNOSIS — M25512 Pain in left shoulder: Secondary | ICD-10-CM | POA: Diagnosis not present

## 2016-12-15 DIAGNOSIS — W19XXXA Unspecified fall, initial encounter: Secondary | ICD-10-CM | POA: Diagnosis not present

## 2016-12-15 DIAGNOSIS — M5030 Other cervical disc degeneration, unspecified cervical region: Secondary | ICD-10-CM | POA: Diagnosis not present

## 2016-12-15 DIAGNOSIS — Z6831 Body mass index (BMI) 31.0-31.9, adult: Secondary | ICD-10-CM | POA: Diagnosis not present

## 2016-12-20 DIAGNOSIS — H4089 Other specified glaucoma: Secondary | ICD-10-CM | POA: Diagnosis not present

## 2016-12-20 DIAGNOSIS — J45998 Other asthma: Secondary | ICD-10-CM | POA: Diagnosis not present

## 2016-12-20 DIAGNOSIS — J45909 Unspecified asthma, uncomplicated: Secondary | ICD-10-CM | POA: Diagnosis not present

## 2016-12-20 DIAGNOSIS — E669 Obesity, unspecified: Secondary | ICD-10-CM | POA: Diagnosis not present

## 2016-12-20 DIAGNOSIS — Z9181 History of falling: Secondary | ICD-10-CM | POA: Diagnosis not present

## 2016-12-20 DIAGNOSIS — M810 Age-related osteoporosis without current pathological fracture: Secondary | ICD-10-CM | POA: Diagnosis not present

## 2016-12-20 DIAGNOSIS — E784 Other hyperlipidemia: Secondary | ICD-10-CM | POA: Diagnosis not present

## 2016-12-20 DIAGNOSIS — J329 Chronic sinusitis, unspecified: Secondary | ICD-10-CM | POA: Diagnosis not present

## 2016-12-20 DIAGNOSIS — H409 Unspecified glaucoma: Secondary | ICD-10-CM | POA: Diagnosis not present

## 2016-12-20 DIAGNOSIS — W19XXXD Unspecified fall, subsequent encounter: Secondary | ICD-10-CM | POA: Diagnosis not present

## 2016-12-20 DIAGNOSIS — Z7982 Long term (current) use of aspirin: Secondary | ICD-10-CM | POA: Diagnosis not present

## 2016-12-20 DIAGNOSIS — E668 Other obesity: Secondary | ICD-10-CM | POA: Diagnosis not present

## 2016-12-20 DIAGNOSIS — Z6831 Body mass index (BMI) 31.0-31.9, adult: Secondary | ICD-10-CM | POA: Diagnosis not present

## 2016-12-20 DIAGNOSIS — E785 Hyperlipidemia, unspecified: Secondary | ICD-10-CM | POA: Diagnosis not present

## 2016-12-20 DIAGNOSIS — I10 Essential (primary) hypertension: Secondary | ICD-10-CM | POA: Diagnosis not present

## 2016-12-20 DIAGNOSIS — D6489 Other specified anemias: Secondary | ICD-10-CM | POA: Diagnosis not present

## 2016-12-20 DIAGNOSIS — D649 Anemia, unspecified: Secondary | ICD-10-CM | POA: Diagnosis not present

## 2016-12-20 DIAGNOSIS — M545 Low back pain: Secondary | ICD-10-CM | POA: Diagnosis not present

## 2016-12-20 DIAGNOSIS — J328 Other chronic sinusitis: Secondary | ICD-10-CM | POA: Diagnosis not present

## 2016-12-20 DIAGNOSIS — S3210XD Unspecified fracture of sacrum, subsequent encounter for fracture with routine healing: Secondary | ICD-10-CM | POA: Diagnosis not present

## 2016-12-21 DIAGNOSIS — J45909 Unspecified asthma, uncomplicated: Secondary | ICD-10-CM | POA: Diagnosis not present

## 2016-12-21 DIAGNOSIS — W19XXXD Unspecified fall, subsequent encounter: Secondary | ICD-10-CM | POA: Diagnosis not present

## 2016-12-21 DIAGNOSIS — M545 Low back pain: Secondary | ICD-10-CM | POA: Diagnosis not present

## 2016-12-21 DIAGNOSIS — S3210XD Unspecified fracture of sacrum, subsequent encounter for fracture with routine healing: Secondary | ICD-10-CM | POA: Diagnosis not present

## 2016-12-21 DIAGNOSIS — E669 Obesity, unspecified: Secondary | ICD-10-CM | POA: Diagnosis not present

## 2016-12-21 DIAGNOSIS — J329 Chronic sinusitis, unspecified: Secondary | ICD-10-CM | POA: Diagnosis not present

## 2016-12-23 ENCOUNTER — Ambulatory Visit (HOSPITAL_COMMUNITY)
Admission: RE | Admit: 2016-12-23 | Discharge: 2016-12-23 | Disposition: A | Discharge status: Home / Self Care | Payer: Medicare Other | Source: Ambulatory Visit | Attending: Internal Medicine | Admitting: Internal Medicine

## 2016-12-23 ENCOUNTER — Encounter (HOSPITAL_COMMUNITY): Payer: Self-pay

## 2016-12-23 DIAGNOSIS — M810 Age-related osteoporosis without current pathological fracture: Secondary | ICD-10-CM | POA: Insufficient documentation

## 2016-12-23 MED ORDER — ZOLEDRONIC ACID 5 MG/100ML IV SOLN
5.0000 mg | Freq: Once | INTRAVENOUS | Status: AC
  Administered 2016-12-23: 5 mg via INTRAVENOUS
  Filled 2016-12-23: qty 100

## 2016-12-23 MED ORDER — SODIUM CHLORIDE 0.9 % IV SOLN
INTRAVENOUS | Status: DC
  Administered 2016-12-23: 12:00:00 via INTRAVENOUS

## 2016-12-23 NOTE — Discharge Instructions (Signed)
Zoledronic Acid injection (Paget's Disease, Osteoporosis) °What is this medicine? °ZOLEDRONIC ACID (ZOE le dron ik AS id) lowers the amount of calcium loss from bone. It is used to treat Paget's disease and osteoporosis in women. °COMMON BRAND NAME(S): Reclast, Zometa °What should I tell my health care provider before I take this medicine? °They need to know if you have any of these conditions: °-aspirin-sensitive asthma °-cancer, especially if you are receiving medicines used to treat cancer °-dental disease or wear dentures °-infection °-kidney disease °-low levels of calcium in the blood °-past surgery on the parathyroid gland or intestines °-receiving corticosteroids like dexamethasone or prednisone °-an unusual or allergic reaction to zoledronic acid, other medicines, foods, dyes, or preservatives °-pregnant or trying to get pregnant °-breast-feeding °How should I use this medicine? °This medicine is for infusion into a vein. It is given by a health care professional in a hospital or clinic setting. °Talk to your pediatrician regarding the use of this medicine in children. This medicine is not approved for use in children. °What if I miss a dose? °It is important not to miss your dose. Call your doctor or health care professional if you are unable to keep an appointment. °What may interact with this medicine? °-certain antibiotics given by injection °-NSAIDs, medicines for pain and inflammation, like ibuprofen or naproxen °-some diuretics like bumetanide, furosemide °-teriparatide °What should I watch for while using this medicine? °Visit your doctor or health care professional for regular checkups. It may be some time before you see the benefit from this medicine. Do not stop taking your medicine unless your doctor tells you to. Your doctor may order blood tests or other tests to see how you are doing. °Women should inform their doctor if they wish to become pregnant or think they might be pregnant. There is a  potential for serious side effects to an unborn child. Talk to your health care professional or pharmacist for more information. °You should make sure that you get enough calcium and vitamin D while you are taking this medicine. Discuss the foods you eat and the vitamins you take with your health care professional. °Some people who take this medicine have severe bone, joint, and/or muscle pain. This medicine may also increase your risk for jaw problems or a broken thigh bone. Tell your doctor right away if you have severe pain in your jaw, bones, joints, or muscles. Tell your doctor if you have any pain that does not go away or that gets worse. °Tell your dentist and dental surgeon that you are taking this medicine. You should not have major dental surgery while on this medicine. See your dentist to have a dental exam and fix any dental problems before starting this medicine. Take good care of your teeth while on this medicine. Make sure you see your dentist for regular follow-up appointments. °What side effects may I notice from receiving this medicine? °Side effects that you should report to your doctor or health care professional as soon as possible: °-allergic reactions like skin rash, itching or hives, swelling of the face, lips, or tongue °-anxiety, confusion, or depression °-breathing problems °-changes in vision °-eye pain °-feeling faint or lightheaded, falls °-jaw pain, especially after dental work °-mouth sores °-muscle cramps, stiffness, or weakness °-redness, blistering, peeling or loosening of the skin, including inside the mouth °-trouble passing urine or change in the amount of urine °Side effects that usually do not require medical attention (report to your doctor or health care professional if   they continue or are bothersome): °-bone, joint, or muscle pain °-constipation °-diarrhea °-fever °-hair loss °-irritation at site where injected °-loss of appetite °-nausea, vomiting °-stomach  upset °-trouble sleeping °-trouble swallowing °-weak or tired °Where should I keep my medicine? °This drug is given in a hospital or clinic and will not be stored at home. °© 2017 [employer] (2014-04-19 14:19:57) ° °

## 2016-12-26 DIAGNOSIS — J45909 Unspecified asthma, uncomplicated: Secondary | ICD-10-CM | POA: Diagnosis not present

## 2016-12-26 DIAGNOSIS — J329 Chronic sinusitis, unspecified: Secondary | ICD-10-CM | POA: Diagnosis not present

## 2016-12-26 DIAGNOSIS — W19XXXD Unspecified fall, subsequent encounter: Secondary | ICD-10-CM | POA: Diagnosis not present

## 2016-12-26 DIAGNOSIS — M545 Low back pain: Secondary | ICD-10-CM | POA: Diagnosis not present

## 2016-12-26 DIAGNOSIS — S3210XD Unspecified fracture of sacrum, subsequent encounter for fracture with routine healing: Secondary | ICD-10-CM | POA: Diagnosis not present

## 2016-12-26 DIAGNOSIS — E669 Obesity, unspecified: Secondary | ICD-10-CM | POA: Diagnosis not present

## 2016-12-27 DIAGNOSIS — H9212 Otorrhea, left ear: Secondary | ICD-10-CM | POA: Diagnosis not present

## 2016-12-27 DIAGNOSIS — J3489 Other specified disorders of nose and nasal sinuses: Secondary | ICD-10-CM | POA: Diagnosis not present

## 2016-12-28 DIAGNOSIS — J45909 Unspecified asthma, uncomplicated: Secondary | ICD-10-CM | POA: Diagnosis not present

## 2016-12-28 DIAGNOSIS — E669 Obesity, unspecified: Secondary | ICD-10-CM | POA: Diagnosis not present

## 2016-12-28 DIAGNOSIS — M545 Low back pain: Secondary | ICD-10-CM | POA: Diagnosis not present

## 2016-12-28 DIAGNOSIS — W19XXXD Unspecified fall, subsequent encounter: Secondary | ICD-10-CM | POA: Diagnosis not present

## 2016-12-28 DIAGNOSIS — J329 Chronic sinusitis, unspecified: Secondary | ICD-10-CM | POA: Diagnosis not present

## 2016-12-28 DIAGNOSIS — S3210XD Unspecified fracture of sacrum, subsequent encounter for fracture with routine healing: Secondary | ICD-10-CM | POA: Diagnosis not present

## 2016-12-30 DIAGNOSIS — J45909 Unspecified asthma, uncomplicated: Secondary | ICD-10-CM | POA: Diagnosis not present

## 2016-12-30 DIAGNOSIS — W19XXXD Unspecified fall, subsequent encounter: Secondary | ICD-10-CM | POA: Diagnosis not present

## 2016-12-30 DIAGNOSIS — M545 Low back pain: Secondary | ICD-10-CM | POA: Diagnosis not present

## 2016-12-30 DIAGNOSIS — S3210XD Unspecified fracture of sacrum, subsequent encounter for fracture with routine healing: Secondary | ICD-10-CM | POA: Diagnosis not present

## 2016-12-30 DIAGNOSIS — E669 Obesity, unspecified: Secondary | ICD-10-CM | POA: Diagnosis not present

## 2016-12-30 DIAGNOSIS — J329 Chronic sinusitis, unspecified: Secondary | ICD-10-CM | POA: Diagnosis not present

## 2017-01-02 DIAGNOSIS — E669 Obesity, unspecified: Secondary | ICD-10-CM | POA: Diagnosis not present

## 2017-01-02 DIAGNOSIS — Z961 Presence of intraocular lens: Secondary | ICD-10-CM | POA: Diagnosis not present

## 2017-01-02 DIAGNOSIS — S3210XD Unspecified fracture of sacrum, subsequent encounter for fracture with routine healing: Secondary | ICD-10-CM | POA: Diagnosis not present

## 2017-01-02 DIAGNOSIS — J329 Chronic sinusitis, unspecified: Secondary | ICD-10-CM | POA: Diagnosis not present

## 2017-01-02 DIAGNOSIS — M545 Low back pain: Secondary | ICD-10-CM | POA: Diagnosis not present

## 2017-01-02 DIAGNOSIS — W19XXXD Unspecified fall, subsequent encounter: Secondary | ICD-10-CM | POA: Diagnosis not present

## 2017-01-02 DIAGNOSIS — J45909 Unspecified asthma, uncomplicated: Secondary | ICD-10-CM | POA: Diagnosis not present

## 2017-01-02 DIAGNOSIS — H401131 Primary open-angle glaucoma, bilateral, mild stage: Secondary | ICD-10-CM | POA: Diagnosis not present

## 2017-01-03 DIAGNOSIS — W19XXXD Unspecified fall, subsequent encounter: Secondary | ICD-10-CM | POA: Diagnosis not present

## 2017-01-03 DIAGNOSIS — S3210XD Unspecified fracture of sacrum, subsequent encounter for fracture with routine healing: Secondary | ICD-10-CM | POA: Diagnosis not present

## 2017-01-03 DIAGNOSIS — J45909 Unspecified asthma, uncomplicated: Secondary | ICD-10-CM | POA: Diagnosis not present

## 2017-01-03 DIAGNOSIS — M545 Low back pain: Secondary | ICD-10-CM | POA: Diagnosis not present

## 2017-01-03 DIAGNOSIS — J329 Chronic sinusitis, unspecified: Secondary | ICD-10-CM | POA: Diagnosis not present

## 2017-01-03 DIAGNOSIS — E669 Obesity, unspecified: Secondary | ICD-10-CM | POA: Diagnosis not present

## 2017-01-04 DIAGNOSIS — W19XXXD Unspecified fall, subsequent encounter: Secondary | ICD-10-CM | POA: Diagnosis not present

## 2017-01-04 DIAGNOSIS — J45909 Unspecified asthma, uncomplicated: Secondary | ICD-10-CM | POA: Diagnosis not present

## 2017-01-04 DIAGNOSIS — J329 Chronic sinusitis, unspecified: Secondary | ICD-10-CM | POA: Diagnosis not present

## 2017-01-04 DIAGNOSIS — E669 Obesity, unspecified: Secondary | ICD-10-CM | POA: Diagnosis not present

## 2017-01-04 DIAGNOSIS — M545 Low back pain: Secondary | ICD-10-CM | POA: Diagnosis not present

## 2017-01-04 DIAGNOSIS — S3210XD Unspecified fracture of sacrum, subsequent encounter for fracture with routine healing: Secondary | ICD-10-CM | POA: Diagnosis not present

## 2017-01-05 DIAGNOSIS — E669 Obesity, unspecified: Secondary | ICD-10-CM | POA: Diagnosis not present

## 2017-01-05 DIAGNOSIS — S3210XD Unspecified fracture of sacrum, subsequent encounter for fracture with routine healing: Secondary | ICD-10-CM | POA: Diagnosis not present

## 2017-01-05 DIAGNOSIS — M545 Low back pain: Secondary | ICD-10-CM | POA: Diagnosis not present

## 2017-01-05 DIAGNOSIS — J45909 Unspecified asthma, uncomplicated: Secondary | ICD-10-CM | POA: Diagnosis not present

## 2017-01-05 DIAGNOSIS — W19XXXD Unspecified fall, subsequent encounter: Secondary | ICD-10-CM | POA: Diagnosis not present

## 2017-01-05 DIAGNOSIS — J329 Chronic sinusitis, unspecified: Secondary | ICD-10-CM | POA: Diagnosis not present

## 2017-01-09 DIAGNOSIS — J329 Chronic sinusitis, unspecified: Secondary | ICD-10-CM | POA: Diagnosis not present

## 2017-01-09 DIAGNOSIS — W19XXXD Unspecified fall, subsequent encounter: Secondary | ICD-10-CM | POA: Diagnosis not present

## 2017-01-09 DIAGNOSIS — M545 Low back pain: Secondary | ICD-10-CM | POA: Diagnosis not present

## 2017-01-09 DIAGNOSIS — E669 Obesity, unspecified: Secondary | ICD-10-CM | POA: Diagnosis not present

## 2017-01-09 DIAGNOSIS — J45909 Unspecified asthma, uncomplicated: Secondary | ICD-10-CM | POA: Diagnosis not present

## 2017-01-09 DIAGNOSIS — S3210XD Unspecified fracture of sacrum, subsequent encounter for fracture with routine healing: Secondary | ICD-10-CM | POA: Diagnosis not present

## 2017-01-10 DIAGNOSIS — H9212 Otorrhea, left ear: Secondary | ICD-10-CM | POA: Diagnosis not present

## 2017-01-10 DIAGNOSIS — J3489 Other specified disorders of nose and nasal sinuses: Secondary | ICD-10-CM | POA: Diagnosis not present

## 2017-01-11 ENCOUNTER — Ambulatory Visit (INDEPENDENT_AMBULATORY_CARE_PROVIDER_SITE_OTHER): Payer: Medicare Other

## 2017-01-11 ENCOUNTER — Ambulatory Visit (INDEPENDENT_AMBULATORY_CARE_PROVIDER_SITE_OTHER): Payer: Medicare Other | Admitting: Orthopaedic Surgery

## 2017-01-11 ENCOUNTER — Telehealth (INDEPENDENT_AMBULATORY_CARE_PROVIDER_SITE_OTHER): Payer: Self-pay | Admitting: *Deleted

## 2017-01-11 DIAGNOSIS — M542 Cervicalgia: Secondary | ICD-10-CM | POA: Diagnosis not present

## 2017-01-11 DIAGNOSIS — M25512 Pain in left shoulder: Secondary | ICD-10-CM

## 2017-01-11 MED ORDER — CYCLOBENZAPRINE HCL 10 MG PO TABS: 5.0000 mg | ORAL_TABLET | Freq: Three times a day (TID) | ORAL | 0 refills | Status: DC | PRN

## 2017-01-11 NOTE — Progress Notes (Signed)
Office Visit Note   Patient: Beverly Jimenez           Date of Birth: 13-Sep-1925           MRN: [id number] Visit Date: 01/11/2017              Requested by: Marton Redwood, MD [address] [place], [place] [address] PCP: Marton Redwood, MD   Assessment & Plan: Visit Diagnoses:  1. Neck pain   2. Acute pain of left shoulder     Plan: She actually her reports significant improvement over the last 2 weeks with home therapy, to her house. She is ambulating with a cane and says other than a little bit of neck stiffness her pain is almost dissipated completely. We'll continue her cyclobenzaprine and she'll continue her home therapy but is really no other recommendations I have for her since she is doing much better overall. She is given reassurance that she has no fracture and this made her feel better overall.  Follow-Up Instructions: Return if symptoms worsen or fail to improve.   Orders:  Orders Placed This Encounter  Procedures  . XR Cervical Spine 2 or 3 views  . XR Shoulder Left   Meds ordered this encounter  Medications  . cyclobenzaprine (FLEXERIL) 10 MG tablet    Sig: Take 0.5 tablets (5 mg total) by mouth 3 (three) times daily as needed for muscle spasms.    Dispense:  50 tablet    Refill:  0      Procedures: No procedures performed   Clinical Data: No additional findings.   Subjective: No chief complaint on file. The patient is a very pleasant 83 year old sent to me by Dr. Curlene Labrum of [place] to evaluate her neck and left shoulder. She reports mechanical fall around Christmas time and she had significant neck pain and stiffness as well as left shoulder pain. She comes to me today saying that things have actually gotten a lot better with time and home therapy coming to arouse. She takes occasional tramadol and occasional Flexeril. She family with a cane and she says she is overall getting around certainly better. She was concerned that she may have  had a fracture wanted this evaluated today as well. She is otherwise a very pleasant individual.  HPI  Review of Systems Right now she points to her neck on the sides as source of her main pain. She denies any chest pain, shortness of breath, fever, chills, nausea, vomiting or headache.  Objective: Vital Signs: There were no vitals taken for this visit.  Physical Exam She is alert and oriented 3. She walks slowly and does use a cane. Ortho Exam She actually has excellent range of motion of both her shoulders. She shows no deficits of the rotator cuff at all. Moves fluidly as a unit on the left shoulder is well located with really no significant pain at all. She does have some stiffness with lateral rotation bending of her cervical spine but no midline tenderness her flexion-extension is full and there are no radicular symptoms or signs. Specialty Comments:  No specialty comments available.  Imaging: Xr Cervical Spine 2 Or 3 Views  Result Date: 01/11/2017 An AP and lateral cervical spine does show some degenerative changes in the cervical spine and some loss of her cervical lordosis but otherwise no acute changes or malalignment issues.  Xr Shoulder Left  Result Date: 01/11/2017 3 views of her left shoulder AP, outlet, axillary show no acute  changes. The shoulders actually very well located in a good position. There is no evidence of rotator cuff arthropathy. Some mild before meals joint arthritis.    PMFS History: Patient Active Problem List   Diagnosis Date Noted  . Neck pain 01/11/2017  . Acute pain of left shoulder 01/11/2017  . Left leg pain 01/06/2015  . Varicose veins of both lower extremities with complications 123456  . HNP (herniated nucleus pulposus), lumbar 07/05/2013   Past Medical History:  Diagnosis Date  . Anemia   . Arthritis    shoulder and knee pain  . Asthma   . Asthma   . Depression    Hx; of situational depression  . GERD (gastroesophageal reflux  disease)   . Glaucoma    Hx; of  . High cholesterol   . Myalgia   . Numbness and tingling    Hx: of B/LLE  . Osteoporosis   . Pneumonia    Hx: of "years ago"  . Shortness of breath     Family History  Problem Relation Age of Onset  . Varicose Veins Mother   . Diabetes Brother   . Heart disease Brother   . Hypertension Brother   . Varicose Veins Brother   . Hypertension Other   . Heart disease Other   . Glaucoma Other   . Diabetes Other   . Varicose Veins Sister   . Varicose Veins Daughter     Past Surgical History:  Procedure Laterality Date  . APPENDECTOMY    . BACK SURGERY    . CHOLECYSTECTOMY    . COLONOSCOPY     Hx: of  . EYE SURGERY     catarac  . 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 HERNIA REPAIR    . LUMBAR LAMINECTOMY/DECOMPRESSION MICRODISCECTOMY Left 07/05/2013   Procedure: LUMBAR LAMINECTOMY/DECOMPRESSION MICRODISCECTOMY 1 LEVEL Left Lumbar five-Sacral one;  Surgeon: Charlie Pitter, MD;  Location: [facility];  Service: Neurosurgery;  Laterality: Left;  Left Lumbar Five-Sacral One Microdiskectomy  . VASCULAR SURGERY     varicose vein surgery   Social History   Occupational History  . Not on file.   Social History Main Topics  . Smoking status: Never Smoker  . Smokeless tobacco: Never Used  . Alcohol use No  . Drug use: No  . Sexual activity: Not on file

## 2017-01-11 NOTE — Telephone Encounter (Signed)
Patients son came in and wanted to have a copy of her X-rays mailed to them if possible. THank you

## 2017-01-11 NOTE — Telephone Encounter (Signed)
CD is made and placed up front for pickup.

## 2017-01-11 NOTE — Telephone Encounter (Signed)
Patient has been notified

## 2017-01-12 ENCOUNTER — Encounter (HOSPITAL_COMMUNITY): Payer: Self-pay

## 2017-01-12 ENCOUNTER — Emergency Department (HOSPITAL_COMMUNITY)
Admission: EM | Admit: 2017-01-12 | Discharge: 2017-01-12 | Disposition: A | Discharge status: Home / Self Care | Payer: Medicare Other | Attending: Emergency Medicine | Admitting: Emergency Medicine

## 2017-01-12 ENCOUNTER — Emergency Department (HOSPITAL_COMMUNITY): Payer: Medicare Other

## 2017-01-12 DIAGNOSIS — Z79899 Other long term (current) drug therapy: Secondary | ICD-10-CM | POA: Diagnosis not present

## 2017-01-12 DIAGNOSIS — J45909 Unspecified asthma, uncomplicated: Secondary | ICD-10-CM | POA: Insufficient documentation

## 2017-01-12 DIAGNOSIS — R109 Unspecified abdominal pain: Secondary | ICD-10-CM | POA: Diagnosis not present

## 2017-01-12 DIAGNOSIS — R1013 Epigastric pain: Secondary | ICD-10-CM | POA: Diagnosis not present

## 2017-01-12 DIAGNOSIS — K219 Gastro-esophageal reflux disease without esophagitis: Secondary | ICD-10-CM | POA: Diagnosis not present

## 2017-01-12 DIAGNOSIS — R110 Nausea: Secondary | ICD-10-CM | POA: Diagnosis not present

## 2017-01-12 DIAGNOSIS — N390 Urinary tract infection, site not specified: Secondary | ICD-10-CM | POA: Diagnosis not present

## 2017-01-12 DIAGNOSIS — R12 Heartburn: Secondary | ICD-10-CM | POA: Diagnosis not present

## 2017-01-12 DIAGNOSIS — K2900 Acute gastritis without bleeding: Secondary | ICD-10-CM | POA: Diagnosis not present

## 2017-01-12 DIAGNOSIS — Z7982 Long term (current) use of aspirin: Secondary | ICD-10-CM | POA: Diagnosis not present

## 2017-01-12 LAB — URINALYSIS, ROUTINE W REFLEX MICROSCOPIC
Bacteria, UA: NONE SEEN
Bilirubin Urine: NEGATIVE
GLUCOSE, UA: NEGATIVE mg/dL
HGB URINE DIPSTICK: NEGATIVE
Ketones, ur: NEGATIVE mg/dL
NITRITE: NEGATIVE
PH: 7 (ref 5.0–8.0)
PROTEIN: NEGATIVE mg/dL
Specific Gravity, Urine: 1.017 (ref 1.005–1.030)

## 2017-01-12 LAB — COMPREHENSIVE METABOLIC PANEL
ALK PHOS: 50 U/L (ref 38–126)
ALT: 14 U/L (ref 14–54)
ANION GAP: 8 (ref 5–15)
AST: 21 U/L (ref 15–41)
Albumin: 4.2 g/dL (ref 3.5–5.0)
BILIRUBIN TOTAL: 0.8 mg/dL (ref 0.3–1.2)
BUN: 13 mg/dL (ref 6–20)
CHLORIDE: 96 mmol/L — AB (ref 101–111)
CO2: 26 mmol/L (ref 22–32)
Calcium: 9.9 mg/dL (ref 8.9–10.3)
Creatinine, Ser: 0.87 mg/dL (ref 0.44–1.00)
GFR calc Af Amer: >60 mL/min (ref >60)
GFR, EST NON AFRICAN AMERICAN: 57 mL/min — AB (ref >60)
Glucose, Bld: 112 mg/dL — ABNORMAL HIGH (ref 65–99)
Potassium: 4.3 mmol/L (ref 3.5–5.1)
SODIUM: 130 mmol/L — AB (ref 135–145)
Total Protein: 7.2 g/dL (ref 6.5–8.1)

## 2017-01-12 LAB — CBC WITH DIFFERENTIAL/PLATELET
Basophils Absolute: 0 10*3/uL (ref 0.0–0.1)
Basophils Relative: 1 %
Eosinophils Absolute: 0.4 10*3/uL (ref 0.0–0.7)
Eosinophils Relative: 7 %
HCT: 27.5 % — ABNORMAL LOW (ref 36.0–46.0)
HEMOGLOBIN: 9.2 g/dL — AB (ref 12.0–15.0)
Lymphocytes Relative: 18 %
Lymphs Abs: 1.1 10*3/uL (ref 0.7–4.0)
MCH: 26.1 pg (ref 26.0–34.0)
MCHC: 33.5 g/dL (ref 30.0–36.0)
MCV: 77.9 fL — AB (ref 78.0–100.0)
Monocytes Absolute: 0.6 10*3/uL (ref 0.1–1.0)
Monocytes Relative: 10 %
NEUTROS PCT: 64 %
Neutro Abs: 4.2 10*3/uL (ref 1.7–7.7)
Platelets: 278 10*3/uL (ref 150–400)
RBC: 3.53 MIL/uL — AB (ref 3.87–5.11)
RDW: 14.3 % (ref 11.5–15.5)
WBC: 6.4 10*3/uL (ref 4.0–10.5)

## 2017-01-12 LAB — I-STAT TROPONIN, ED: Troponin i, poc: 0 ng/mL (ref 0.00–0.08)

## 2017-01-12 LAB — LIPASE, BLOOD: LIPASE: 23 U/L (ref 11–51)

## 2017-01-12 MED ORDER — ONDANSETRON HCL 4 MG PO TABS: 4.0000 mg | ORAL_TABLET | Freq: Three times a day (TID) | ORAL | 0 refills | Status: DC | PRN

## 2017-01-12 MED ORDER — SUCRALFATE 1 GM/10ML PO SUSP: 1.0000 g | Freq: Three times a day (TID) | ORAL | 0 refills | Status: DC

## 2017-01-12 MED ORDER — GI COCKTAIL ~~LOC~~
30.0000 mL | Freq: Once | ORAL | Status: AC
  Administered 2017-01-12: 30 mL via ORAL
  Filled 2017-01-12: qty 30

## 2017-01-12 MED ORDER — CIPROFLOXACIN HCL 250 MG PO TABS: 250.0000 mg | ORAL_TABLET | Freq: Two times a day (BID) | ORAL | 0 refills | Status: DC

## 2017-01-12 MED ORDER — ONDANSETRON HCL 4 MG/2ML IJ SOLN
4.0000 mg | Freq: Once | INTRAMUSCULAR | Status: AC
  Administered 2017-01-12: 4 mg via INTRAVENOUS
  Filled 2017-01-12: qty 2

## 2017-01-12 NOTE — ED Provider Notes (Signed)
[facility] DEPT Provider Note   CSN: [id number] Arrival date & time: 01/12/17  0850     History   Chief Complaint Chief Complaint  Patient presents with  . Nausea    HPI Beverly Jimenez is a 83 y.o. female.  HPI Patient presents with 4 days of postprandial epigastric discomfort and nausea. States is worse at nights. Denies radiation of pain. No chest pain or shortness of breath. Denies diarrhea, grossly bloody or melanotic stools. No fever or chills. Says she's had previous hiatal hernia surgery but denies any other abdominal surgeries. Past Medical History:  Diagnosis Date  . Anemia   . Arthritis    shoulder and knee pain  . Asthma   . Asthma   . Depression    Hx; of situational depression  . GERD (gastroesophageal reflux disease)   . Glaucoma    Hx; of  . High cholesterol   . Myalgia   . Numbness and tingling    Hx: of B/LLE  . Osteoporosis   . Pneumonia    Hx: of "years ago"  . Shortness of breath     Patient Active Problem List   Diagnosis Date Noted  . Neck pain 01/11/2017  . Acute pain of left shoulder 01/11/2017  . Left leg pain 01/06/2015  . Varicose veins of both lower extremities with complications 123456  . HNP (herniated nucleus pulposus), lumbar 07/05/2013    Past Surgical History:  Procedure Laterality Date  . APPENDECTOMY    . BACK SURGERY    . CHOLECYSTECTOMY    . COLONOSCOPY     Hx: of  . EYE SURGERY     catarac  . 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 HERNIA REPAIR    . LUMBAR LAMINECTOMY/DECOMPRESSION MICRODISCECTOMY Left 07/05/2013   Procedure: LUMBAR LAMINECTOMY/DECOMPRESSION MICRODISCECTOMY 1 LEVEL Left Lumbar five-Sacral one;  Surgeon: Charlie Pitter, MD;  Location: [facility];  Service: Neurosurgery;  Laterality: Left;  Left Lumbar Five-Sacral One Microdiskectomy  . VASCULAR SURGERY     varicose vein surgery    OB History    No data available       Home Medications    Prior to Admission medications     Medication Sig Start Date End Date Taking? Authorizing Provider  acetaminophen (TYLENOL) 500 MG tablet Take 500-1,000 mg by mouth every 4 (four) hours as needed for mild pain, moderate pain, fever or headache.    Yes Historical Provider, MD  albuterol (PROVENTIL HFA;VENTOLIN HFA) 108 (90 BASE) MCG/ACT inhaler Inhale 2 puffs into the lungs every 6 (six) hours as needed for wheezing or shortness of breath.    Yes Historical Provider, MD  aspirin EC 81 MG tablet Take 81 mg by mouth daily.   Yes Historical Provider, MD  bimatoprost (LUMIGAN) 0.01 % SOLN Place 1 drop into both eyes at bedtime.   Yes Historical Provider, MD  Calcium Carbonate-Vit D-Min (CALTRATE PLUS PO) Take 2 tablets by mouth 2 (two) times daily.    Yes Historical Provider, MD  cetirizine (ZYRTEC) 10 MG tablet Take 10 mg by mouth daily.   Yes Historical Provider, MD  Cyanocobalamin (VITAMIN B-12 PO) Place 1 tablet under the tongue daily.   Yes Historical Provider, MD  cyclobenzaprine (FLEXERIL) 10 MG tablet Take 0.5 tablets (5 mg total) by mouth 3 (three) times daily as needed for muscle spasms. 01/11/17  Yes Mcarthur Rossetti, MD  esomeprazole (NEXIUM) 40 MG capsule Take 40 mg by mouth  daily before breakfast.   Yes Historical Provider, MD  fish oil-omega-3 fatty acids 1000 MG capsule Take 2 g by mouth 2 (two) times daily.    Yes Historical Provider, MD  fluticasone (FLONASE) 50 MCG/ACT nasal spray Place 2 sprays into the nose at bedtime.    Yes Historical Provider, MD  Fluticasone-Salmeterol (ADVAIR) 100-50 MCG/DOSE AEPB Inhale 1 puff into the lungs 2 (two) times daily.   Yes Historical Provider, MD  irbesartan (AVAPRO) 150 MG tablet Take 150 mg by mouth daily.   Yes Historical Provider, MD  niacin 500 MG tablet Take 500 mg by mouth daily with breakfast.   Yes Historical Provider, MD  Potassium 99 MG TABS Take 99 mg by mouth daily.   Yes Historical Provider, MD  sodium chloride (OCEAN) 0.65 % SOLN nasal spray Place 1 spray into  both nostrils as needed for congestion.   Yes Historical Provider, MD  traMADol (ULTRAM) 50 MG tablet Take 50 mg by mouth every 6 (six) hours as needed for moderate pain or severe pain.    Yes Historical Provider, MD  vitamin C (ASCORBIC ACID) 500 MG tablet Take 500 mg by mouth daily.   Yes Historical Provider, MD  vitamin E 400 UNIT capsule Take 400 Units by mouth daily.   Yes Historical Provider, MD  ondansetron (ZOFRAN) 4 MG tablet Take 1 tablet (4 mg total) by mouth every 8 (eight) hours as needed for nausea or vomiting. 01/12/17   Julianne Rice, MD  sucralfate (CARAFATE) 1 GM/10ML suspension Take 10 mLs (1 g total) by mouth 4 (four) times daily -  with meals and at bedtime. 01/12/17   Julianne Rice, MD    Family History Family History  Problem Relation Age of Onset  . Varicose Veins Mother   . Diabetes Brother   . Heart disease Brother   . Hypertension Brother   . Varicose Veins Brother   . Hypertension Other   . Heart disease Other   . Glaucoma Other   . Diabetes Other   . Varicose Veins Sister   . Varicose Veins Daughter     Social History Social History  Substance Use Topics  . Smoking status: Never Smoker  . Smokeless tobacco: Never Used  . Alcohol use No     Allergies   Amoxicillin; Macrodantin [nitrofurantoin macrocrystal]; and Sulfa antibiotics   Review of Systems Review of Systems  Constitutional: Positive for appetite change. Negative for chills and unexpected weight change.  HENT: Negative for congestion, sore throat and trouble swallowing.   Respiratory: Negative for cough and shortness of breath.   Cardiovascular: Negative for chest pain, palpitations and leg swelling.  Gastrointestinal: Positive for abdominal pain and nausea. Negative for blood in stool, constipation, diarrhea and vomiting.  Genitourinary: Negative for dysuria, flank pain, frequency and hematuria.  Musculoskeletal: Negative for back pain and neck pain.  Skin: Negative for rash and  wound.  Neurological: Negative for dizziness, weakness, light-headedness, numbness and headaches.  Psychiatric/Behavioral: Positive for sleep disturbance.  All other systems reviewed and are negative.    Physical Exam Updated Vital Signs BP 139/74   Pulse 67   Temp 98.2 F (36.8 C) (Oral)   Resp 15   SpO2 100%   Physical Exam  Constitutional: She is oriented to person, place, and time. She appears well-developed and well-nourished. No distress.  HENT:  Head: Normocephalic and atraumatic.  Mouth/Throat: Oropharynx is clear and moist. No oropharyngeal exudate.  Eyes: EOM are normal. Pupils are equal, round, and  reactive to light.  Neck: Normal range of motion. Neck supple.  Cardiovascular: Normal rate and regular rhythm.  Exam reveals no gallop and no friction rub.   No murmur heard. Pulmonary/Chest: Effort normal and breath sounds normal. No respiratory distress. She has no wheezes. She has no rales. She exhibits no tenderness.  Abdominal: Soft. Bowel sounds are normal. She exhibits no distension. There is tenderness (epigastric discomfort with palpation. No rebound or guarding. No pulsatile masses). There is no rebound and no guarding.  Musculoskeletal: Normal range of motion. She exhibits no edema or tenderness.  No CVA tenderness. No midline thoracic or lumbar tenderness. Patient has diffuse left lower leg tenderness to palpation. No asymmetry or swelling. Distal pulses equal.  Lymphadenopathy:    She has no cervical adenopathy.  Neurological: She is alert and oriented to person, place, and time.  Skin: Skin is warm and dry. No rash noted. She is not diaphoretic. No erythema.  Psychiatric: She has a normal mood and affect. Her behavior is normal.  Nursing note and vitals reviewed.    ED Treatments / Results  Labs (all labs ordered are listed, but only abnormal results are displayed) Labs Reviewed  CBC WITH DIFFERENTIAL/PLATELET - Abnormal; Notable for the following:        Result Value   RBC 3.53 (*)    Hemoglobin 9.2 (*)    HCT 27.5 (*)    MCV 77.9 (*)    All other components within normal limits  COMPREHENSIVE METABOLIC PANEL - Abnormal; Notable for the following:    Sodium 130 (*)    Chloride 96 (*)    Glucose, Bld 112 (*)    GFR calc non Af Amer 57 (*)    All other components within normal limits  URINALYSIS, ROUTINE W REFLEX MICROSCOPIC - Abnormal; Notable for the following:    Leukocytes, UA TRACE (*)    Squamous Epithelial / LPF 0-5 (*)    All other components within normal limits  LIPASE, BLOOD  I-STAT TROPOININ, ED    EKG  EKG Interpretation None       Radiology US Abdomen Limited  Result Date: 01/12/2017 CLINICAL DATA:  Abdominal pain and nausea for 1 week. EXAM: US ABDOMEN LIMITED - RIGHT UPPER QUADRANT COMPARISON:  09/26/2014 CT abdomen FINDINGS: Gallbladder: Surgically absent Common bile duct: Diameter: 5 mm Liver: No focal lesion identified. Within normal limits in parenchymal echogenicity. IMPRESSION: 1. No significant abnormality of the liver or visualized biliary tree identified. Gallbladder surgically absent. Electronically Signed   By: Van Clines M.D.   On: 01/12/2017 11:16   Xr Cervical Spine 2 Or 3 Views  Result Date: 01/11/2017 An AP and lateral cervical spine does show some degenerative changes in the cervical spine and some loss of her cervical lordosis but otherwise no acute changes or malalignment issues.  Xr Shoulder Left  Result Date: 01/11/2017 3 views of her left shoulder AP, outlet, axillary show no acute changes. The shoulders actually very well located in a good position. There is no evidence of rotator cuff arthropathy. Some mild before meals joint arthritis.   Procedures Procedures (including critical care time)  Medications Ordered in ED Medications  gi cocktail (Maalox,Lidocaine,Donnatal) (30 mLs Oral Given 01/12/17 1029)  ondansetron (ZOFRAN) injection 4 mg (4 mg Intravenous Given 01/12/17 1030)      Initial Impression / Assessment and Plan / ED Course  I have reviewed the triage vital signs and the nursing notes.  Pertinent labs & imaging results that were  available during my care of the patient were reviewed by me and considered in my medical decision making (see chart for details).    Patient states her epigastric discomfort and nausea have completely resolved. Abdominal exam remains benign. We'll start on Zofran and Carafate and have follow-up with her gastroenterologist. Return precautions given.   Final Clinical Impressions(s) / ED Diagnoses   Final diagnoses:  Acute gastritis, presence of bleeding unspecified, unspecified gastritis type  Nausea    New Prescriptions New Prescriptions   ONDANSETRON (ZOFRAN) 4 MG TABLET    Take 1 tablet (4 mg total) by mouth every 8 (eight) hours as needed for nausea or vomiting.   SUCRALFATE (CARAFATE) 1 GM/10ML SUSPENSION    Take 10 mLs (1 g total) by mouth 4 (four) times daily -  with meals and at bedtime.     Julianne Rice, MD 01/12/17 1325

## 2017-01-12 NOTE — ED Triage Notes (Addendum)
Pt states when she eats food goes through her system. Denies that it is diarrhea.  Pt states no fever.  Nausea with no vomiting.  Slight discomfort to upper abdomen.  Normal eating and drinking.  Symptoms began x 2-3 days ago.

## 2017-01-13 DIAGNOSIS — E669 Obesity, unspecified: Secondary | ICD-10-CM | POA: Diagnosis not present

## 2017-01-13 DIAGNOSIS — J329 Chronic sinusitis, unspecified: Secondary | ICD-10-CM | POA: Diagnosis not present

## 2017-01-13 DIAGNOSIS — W19XXXD Unspecified fall, subsequent encounter: Secondary | ICD-10-CM | POA: Diagnosis not present

## 2017-01-13 DIAGNOSIS — M545 Low back pain: Secondary | ICD-10-CM | POA: Diagnosis not present

## 2017-01-13 DIAGNOSIS — J45909 Unspecified asthma, uncomplicated: Secondary | ICD-10-CM | POA: Diagnosis not present

## 2017-01-13 DIAGNOSIS — S3210XD Unspecified fracture of sacrum, subsequent encounter for fracture with routine healing: Secondary | ICD-10-CM | POA: Diagnosis not present

## 2017-01-16 ENCOUNTER — Encounter (HOSPITAL_COMMUNITY): Payer: Self-pay | Admitting: Emergency Medicine

## 2017-01-16 ENCOUNTER — Emergency Department (HOSPITAL_COMMUNITY): Payer: Medicare Other

## 2017-01-16 ENCOUNTER — Inpatient Hospital Stay (HOSPITAL_COMMUNITY)
Admission: EM | Admit: 2017-01-16 | Discharge: 2017-01-18 | DRG: 194 | Disposition: A | Discharge status: Home Health | Payer: Medicare Other | Attending: Internal Medicine | Admitting: Internal Medicine

## 2017-01-16 DIAGNOSIS — E785 Hyperlipidemia, unspecified: Secondary | ICD-10-CM | POA: Diagnosis present

## 2017-01-16 DIAGNOSIS — I10 Essential (primary) hypertension: Secondary | ICD-10-CM | POA: Diagnosis present

## 2017-01-16 DIAGNOSIS — D509 Iron deficiency anemia, unspecified: Secondary | ICD-10-CM | POA: Diagnosis present

## 2017-01-16 DIAGNOSIS — J209 Acute bronchitis, unspecified: Secondary | ICD-10-CM | POA: Diagnosis not present

## 2017-01-16 DIAGNOSIS — R319 Hematuria, unspecified: Secondary | ICD-10-CM | POA: Diagnosis not present

## 2017-01-16 DIAGNOSIS — J111 Influenza due to unidentified influenza virus with other respiratory manifestations: Secondary | ICD-10-CM

## 2017-01-16 DIAGNOSIS — Z79899 Other long term (current) drug therapy: Secondary | ICD-10-CM

## 2017-01-16 DIAGNOSIS — J101 Influenza due to other identified influenza virus with other respiratory manifestations: Secondary | ICD-10-CM | POA: Diagnosis present

## 2017-01-16 DIAGNOSIS — J45909 Unspecified asthma, uncomplicated: Secondary | ICD-10-CM | POA: Diagnosis present

## 2017-01-16 DIAGNOSIS — D649 Anemia, unspecified: Secondary | ICD-10-CM

## 2017-01-16 DIAGNOSIS — Z8249 Family history of ischemic heart disease and other diseases of the circulatory system: Secondary | ICD-10-CM

## 2017-01-16 DIAGNOSIS — Z833 Family history of diabetes mellitus: Secondary | ICD-10-CM

## 2017-01-16 DIAGNOSIS — Z7982 Long term (current) use of aspirin: Secondary | ICD-10-CM

## 2017-01-16 DIAGNOSIS — R05 Cough: Secondary | ICD-10-CM | POA: Diagnosis not present

## 2017-01-16 DIAGNOSIS — N390 Urinary tract infection, site not specified: Secondary | ICD-10-CM | POA: Diagnosis not present

## 2017-01-16 DIAGNOSIS — D638 Anemia in other chronic diseases classified elsewhere: Secondary | ICD-10-CM | POA: Diagnosis not present

## 2017-01-16 DIAGNOSIS — R0602 Shortness of breath: Secondary | ICD-10-CM | POA: Diagnosis not present

## 2017-01-16 DIAGNOSIS — E871 Hypo-osmolality and hyponatremia: Secondary | ICD-10-CM | POA: Diagnosis present

## 2017-01-16 DIAGNOSIS — R0600 Dyspnea, unspecified: Secondary | ICD-10-CM | POA: Diagnosis not present

## 2017-01-16 DIAGNOSIS — K219 Gastro-esophageal reflux disease without esophagitis: Secondary | ICD-10-CM | POA: Diagnosis present

## 2017-01-16 DIAGNOSIS — R0603 Acute respiratory distress: Secondary | ICD-10-CM

## 2017-01-16 DIAGNOSIS — Z9049 Acquired absence of other specified parts of digestive tract: Secondary | ICD-10-CM

## 2017-01-16 DIAGNOSIS — K2970 Gastritis, unspecified, without bleeding: Secondary | ICD-10-CM | POA: Diagnosis present

## 2017-01-16 LAB — BASIC METABOLIC PANEL
Anion gap: 8 (ref 5–15)
BUN: 6 mg/dL (ref 6–20)
CHLORIDE: 92 mmol/L — AB (ref 101–111)
CO2: 25 mmol/L (ref 22–32)
Calcium: 9.3 mg/dL (ref 8.9–10.3)
Creatinine, Ser: 0.91 mg/dL (ref 0.44–1.00)
GFR calc Af Amer: >60 mL/min (ref >60)
GFR, EST NON AFRICAN AMERICAN: 54 mL/min — AB (ref >60)
Glucose, Bld: 109 mg/dL — ABNORMAL HIGH (ref 65–99)
POTASSIUM: 4.7 mmol/L (ref 3.5–5.1)
SODIUM: 125 mmol/L — AB (ref 135–145)

## 2017-01-16 LAB — CBC WITH DIFFERENTIAL/PLATELET
BASOS ABS: 0 10*3/uL (ref 0.0–0.1)
Basophils Relative: 1 %
EOS ABS: 0.2 10*3/uL (ref 0.0–0.7)
Eosinophils Relative: 5 %
HCT: 25.3 % — ABNORMAL LOW (ref 36.0–46.0)
HEMOGLOBIN: 8.2 g/dL — AB (ref 12.0–15.0)
LYMPHS ABS: 0.5 10*3/uL — AB (ref 0.7–4.0)
LYMPHS PCT: 9 %
MCH: 25.2 pg — AB (ref 26.0–34.0)
MCHC: 32.4 g/dL (ref 30.0–36.0)
MCV: 77.6 fL — AB (ref 78.0–100.0)
Monocytes Absolute: 0.8 10*3/uL (ref 0.1–1.0)
Monocytes Relative: 16 %
NEUTROS PCT: 69 %
Neutro Abs: 3.6 10*3/uL (ref 1.7–7.7)
Platelets: 212 10*3/uL (ref 150–400)
RBC: 3.26 MIL/uL — AB (ref 3.87–5.11)
RDW: 13.8 % (ref 11.5–15.5)
WBC: 5.1 10*3/uL (ref 4.0–10.5)

## 2017-01-16 LAB — OSMOLALITY, URINE: Osmolality, Ur: 309 mOsm/kg (ref 300–900)

## 2017-01-16 LAB — BRAIN NATRIURETIC PEPTIDE: B NATRIURETIC PEPTIDE 5: 68.9 pg/mL (ref 0.0–100.0)

## 2017-01-16 LAB — OSMOLALITY: Osmolality: 263 mOsm/kg — ABNORMAL LOW (ref 275–295)

## 2017-01-16 LAB — FERRITIN: FERRITIN: 70 ng/mL (ref 11–307)

## 2017-01-16 LAB — SODIUM, URINE, RANDOM: Sodium, Ur: 89 mmol/L

## 2017-01-16 LAB — TSH: TSH: 1.45 u[IU]/mL (ref 0.350–4.500)

## 2017-01-16 LAB — IRON AND TIBC
IRON: 14 ug/dL — AB (ref 28–170)
Saturation Ratios: 5 % — ABNORMAL LOW (ref 10.4–31.8)
TIBC: 307 ug/dL (ref 250–450)
UIBC: 293 ug/dL

## 2017-01-16 LAB — INFLUENZA PANEL BY PCR (TYPE A & B)
INFLAPCR: POSITIVE — AB
Influenza B By PCR: NEGATIVE

## 2017-01-16 LAB — I-STAT TROPONIN, ED: Troponin i, poc: 0 ng/mL (ref 0.00–0.08)

## 2017-01-16 LAB — RETICULOCYTES
RBC.: 3.28 MIL/uL — AB (ref 3.87–5.11)
RETIC COUNT ABSOLUTE: 59 10*3/uL (ref 19.0–186.0)
RETIC CT PCT: 1.8 % (ref 0.4–3.1)

## 2017-01-16 LAB — FOLATE: Folate: 8.9 ng/mL (ref >5.9)

## 2017-01-16 LAB — VITAMIN B12: Vitamin B-12: 2825 pg/mL — ABNORMAL HIGH (ref 180–914)

## 2017-01-16 MED ORDER — LATANOPROST 0.005 % OP SOLN
1.0000 [drp] | Freq: Every day | OPHTHALMIC | Status: DC
  Administered 2017-01-16 – 2017-01-17 (×2): 1 [drp] via OPHTHALMIC
  Filled 2017-01-16: qty 2.5

## 2017-01-16 MED ORDER — ONDANSETRON HCL 4 MG PO TABS: 4.0000 mg | ORAL_TABLET | Freq: Four times a day (QID) | ORAL | Status: DC | PRN

## 2017-01-16 MED ORDER — OSELTAMIVIR PHOSPHATE 30 MG PO CAPS
30.0000 mg | ORAL_CAPSULE | Freq: Two times a day (BID) | ORAL | Status: DC
  Administered 2017-01-16 – 2017-01-18 (×5): 30 mg via ORAL
  Filled 2017-01-16 (×5): qty 1

## 2017-01-16 MED ORDER — ASPIRIN EC 81 MG PO TBEC
81.0000 mg | DELAYED_RELEASE_TABLET | Freq: Every day | ORAL | Status: DC
  Administered 2017-01-16 – 2017-01-18 (×3): 81 mg via ORAL
  Filled 2017-01-16 (×3): qty 1

## 2017-01-16 MED ORDER — PANTOPRAZOLE SODIUM 40 MG PO TBEC
40.0000 mg | DELAYED_RELEASE_TABLET | Freq: Every day | ORAL | Status: DC
  Administered 2017-01-16 – 2017-01-18 (×3): 40 mg via ORAL
  Filled 2017-01-16 (×3): qty 1

## 2017-01-16 MED ORDER — ACETAMINOPHEN 325 MG PO TABS: 650.0000 mg | ORAL_TABLET | Freq: Four times a day (QID) | ORAL | Status: DC | PRN

## 2017-01-16 MED ORDER — MOMETASONE FURO-FORMOTEROL FUM 100-5 MCG/ACT IN AERO
2.0000 | INHALATION_SPRAY | Freq: Two times a day (BID) | RESPIRATORY_TRACT | Status: DC
  Administered 2017-01-16 – 2017-01-18 (×5): 2 via RESPIRATORY_TRACT
  Filled 2017-01-16: qty 8.8

## 2017-01-16 MED ORDER — FLUTICASONE PROPIONATE 50 MCG/ACT NA SUSP
2.0000 | Freq: Every day | NASAL | Status: DC
  Administered 2017-01-16 – 2017-01-17 (×2): 2 via NASAL
  Filled 2017-01-16: qty 16

## 2017-01-16 MED ORDER — TRAMADOL HCL 50 MG PO TABS
50.0000 mg | ORAL_TABLET | Freq: Four times a day (QID) | ORAL | Status: DC | PRN
  Administered 2017-01-16: 50 mg via ORAL
  Filled 2017-01-16: qty 1

## 2017-01-16 MED ORDER — DEXAMETHASONE SODIUM PHOSPHATE 10 MG/ML IJ SOLN
10.0000 mg | Freq: Once | INTRAMUSCULAR | Status: AC
  Administered 2017-01-16: 10 mg via INTRAVENOUS
  Filled 2017-01-16: qty 1

## 2017-01-16 MED ORDER — CYCLOBENZAPRINE HCL 5 MG PO TABS: 5.0000 mg | ORAL_TABLET | Freq: Three times a day (TID) | ORAL | Status: DC | PRN

## 2017-01-16 MED ORDER — IRBESARTAN 150 MG PO TABS
150.0000 mg | ORAL_TABLET | Freq: Every day | ORAL | Status: DC
  Administered 2017-01-16 – 2017-01-18 (×3): 150 mg via ORAL
  Filled 2017-01-16 (×3): qty 1

## 2017-01-16 MED ORDER — CIPROFLOXACIN HCL 250 MG PO TABS
250.0000 mg | ORAL_TABLET | Freq: Two times a day (BID) | ORAL | Status: DC
  Administered 2017-01-16 – 2017-01-18 (×5): 250 mg via ORAL
  Filled 2017-01-16 (×8): qty 1

## 2017-01-16 MED ORDER — IPRATROPIUM-ALBUTEROL 0.5-2.5 (3) MG/3ML IN SOLN
3.0000 mL | Freq: Once | RESPIRATORY_TRACT | Status: AC
  Administered 2017-01-16: 3 mL via RESPIRATORY_TRACT
  Filled 2017-01-16: qty 3

## 2017-01-16 MED ORDER — SODIUM CHLORIDE 0.9 % IV SOLN: INTRAVENOUS | Status: DC

## 2017-01-16 MED ORDER — ONDANSETRON HCL 4 MG/2ML IJ SOLN: 4.0000 mg | Freq: Four times a day (QID) | INTRAMUSCULAR | Status: DC | PRN

## 2017-01-16 MED ORDER — SALINE SPRAY 0.65 % NA SOLN
1.0000 | NASAL | Status: DC | PRN
  Filled 2017-01-16: qty 44

## 2017-01-16 MED ORDER — FLUCONAZOLE 100 MG PO TABS
150.0000 mg | ORAL_TABLET | Freq: Once | ORAL | Status: AC
  Administered 2017-01-16: 150 mg via ORAL
  Filled 2017-01-16: qty 2
  Filled 2017-01-16: qty 1.5

## 2017-01-16 MED ORDER — OMEGA-3-ACID ETHYL ESTERS 1 G PO CAPS
2.0000 g | ORAL_CAPSULE | Freq: Two times a day (BID) | ORAL | Status: DC
  Administered 2017-01-16 – 2017-01-18 (×5): 2 g via ORAL
  Filled 2017-01-16 (×5): qty 2

## 2017-01-16 MED ORDER — ALBUTEROL SULFATE (2.5 MG/3ML) 0.083% IN NEBU
2.5000 mg | INHALATION_SOLUTION | Freq: Four times a day (QID) | RESPIRATORY_TRACT | Status: DC | PRN
  Administered 2017-01-16 – 2017-01-17 (×2): 2.5 mg via RESPIRATORY_TRACT
  Filled 2017-01-16 (×2): qty 3

## 2017-01-16 MED ORDER — OSELTAMIVIR PHOSPHATE 75 MG PO CAPS: 75.0000 mg | ORAL_CAPSULE | Freq: Once | ORAL | Status: DC

## 2017-01-16 MED ORDER — LORATADINE 10 MG PO TABS
10.0000 mg | ORAL_TABLET | Freq: Every day | ORAL | Status: DC
Start: 2017-01-16 — End: 2017-01-18
  Administered 2017-01-16 – 2017-01-18 (×3): 10 mg via ORAL
  Filled 2017-01-16 (×3): qty 1

## 2017-01-16 MED ORDER — SODIUM CHLORIDE 0.9 % IV BOLUS (SEPSIS)
1000.0000 mL | Freq: Once | INTRAVENOUS | Status: AC
  Administered 2017-01-16: 1000 mL via INTRAVENOUS

## 2017-01-16 MED ORDER — ENOXAPARIN SODIUM 40 MG/0.4ML ~~LOC~~ SOLN
40.0000 mg | SUBCUTANEOUS | Status: DC
  Administered 2017-01-16 – 2017-01-18 (×3): 40 mg via SUBCUTANEOUS
  Filled 2017-01-16 (×3): qty 0.4

## 2017-01-16 MED ORDER — ACETAMINOPHEN 650 MG RE SUPP: 650.0000 mg | Freq: Four times a day (QID) | RECTAL | Status: DC | PRN

## 2017-01-16 MED ORDER — SUCRALFATE 1 GM/10ML PO SUSP
1.0000 g | Freq: Three times a day (TID) | ORAL | Status: DC
  Administered 2017-01-16 – 2017-01-18 (×9): 1 g via ORAL
  Filled 2017-01-16 (×9): qty 10

## 2017-01-16 MED ORDER — NIACIN 500 MG PO TABS
500.0000 mg | ORAL_TABLET | Freq: Every day | ORAL | Status: DC
  Administered 2017-01-16 – 2017-01-18 (×3): 500 mg via ORAL
  Filled 2017-01-16 (×3): qty 1

## 2017-01-16 MED ORDER — OSELTAMIVIR PHOSPHATE 30 MG PO CAPS
30.0000 mg | ORAL_CAPSULE | Freq: Once | ORAL | Status: DC
  Filled 2017-01-16: qty 1

## 2017-01-16 NOTE — ED Triage Notes (Signed)
Pt c/o sore throat, cough and nasal congestion. Pt recently seen at [facility] for epigastric pain and dx with GERD.  Family states pt began having difficulty breathing yesterday.

## 2017-01-16 NOTE — ED Notes (Signed)
Pt completed neb tx, pt states she feels better at this time. Family at bedside.

## 2017-01-16 NOTE — ED Provider Notes (Signed)
[facility] DEPT Provider Note   CSN: [id number] Arrival date & time: 01/16/17  [id number]     History   Chief Complaint Chief Complaint  Patient presents with  . Shortness of Breath    HPI Beverly Jimenez is a 83 y.o. female.  HPI  This is an 83 year old female who presents with shortness of breath and sore throat. Recently evaluated at [facility] for epigastric pain thought to be secondary to gastritis. Patient states that she recently has also had a sinus infection. Over last 24 hours she reports worsening shortness of breath and sore throat. Denies fevers but does endorse chills. Denies chest pain or lower extremity swelling. Denies nausea, vomiting, or diarrhea.  Past Medical History:  Diagnosis Date  . Anemia   . Arthritis    shoulder and knee pain  . Asthma   . Asthma   . Depression    Hx; of situational depression  . GERD (gastroesophageal reflux disease)   . Glaucoma    Hx; of  . High cholesterol   . Myalgia   . Numbness and tingling    Hx: of B/LLE  . Osteoporosis   . Pneumonia    Hx: of "years ago"  . Shortness of breath     Patient Active Problem List   Diagnosis Date Noted  . Influenza A 01/16/2017  . Neck pain 01/11/2017  . Acute pain of left shoulder 01/11/2017  . Left leg pain 01/06/2015  . Varicose veins of both lower extremities with complications 123456  . HNP (herniated nucleus pulposus), lumbar 07/05/2013    Past Surgical History:  Procedure Laterality Date  . APPENDECTOMY    . BACK SURGERY    . CHOLECYSTECTOMY    . COLONOSCOPY     Hx: of  . EYE SURGERY     catarac  . 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 HERNIA REPAIR    . LUMBAR LAMINECTOMY/DECOMPRESSION MICRODISCECTOMY Left 07/05/2013   Procedure: LUMBAR LAMINECTOMY/DECOMPRESSION MICRODISCECTOMY 1 LEVEL Left Lumbar five-Sacral one;  Surgeon: Charlie Pitter, MD;  Location: [facility];  Service: Neurosurgery;  Laterality: Left;  Left Lumbar Five-Sacral One  Microdiskectomy  . VASCULAR SURGERY     varicose vein surgery    OB History    No data available       Home Medications    Prior to Admission medications   Medication Sig Start Date End Date Taking? Authorizing Provider  acetaminophen (TYLENOL) 500 MG tablet Take 500-1,000 mg by mouth every 4 (four) hours as needed for mild pain, moderate pain, fever or headache.    Yes Historical Provider, MD  albuterol (PROVENTIL HFA;VENTOLIN HFA) 108 (90 BASE) MCG/ACT inhaler Inhale 2 puffs into the lungs every 6 (six) hours as needed for wheezing or shortness of breath.    Yes Historical Provider, MD  aspirin EC 81 MG tablet Take 81 mg by mouth daily.   Yes Historical Provider, MD  bimatoprost (LUMIGAN) 0.01 % SOLN Place 1 drop into both eyes at bedtime.   Yes Historical Provider, MD  Calcium Carbonate-Vit D-Min (CALTRATE PLUS PO) Take 2 tablets by mouth 2 (two) times daily.    Yes Historical Provider, MD  cetirizine (ZYRTEC) 10 MG tablet Take 10 mg by mouth daily.   Yes Historical Provider, MD  ciprofloxacin (CIPRO) 250 MG tablet Take 1 tablet (250 mg total) by mouth every 12 (twelve) hours. 01/12/17  Yes Julianne Rice, MD  Cyanocobalamin (VITAMIN B-12 PO) Place 1  tablet under the tongue daily.   Yes Historical Provider, MD  cyclobenzaprine (FLEXERIL) 10 MG tablet Take 0.5 tablets (5 mg total) by mouth 3 (three) times daily as needed for muscle spasms. 01/11/17  Yes Mcarthur Rossetti, MD  esomeprazole (NEXIUM) 40 MG capsule Take 40 mg by mouth daily before breakfast.   Yes Historical Provider, MD  fish oil-omega-3 fatty acids 1000 MG capsule Take 2 g by mouth 2 (two) times daily.    Yes Historical Provider, MD  fluconazole (DIFLUCAN) 150 MG tablet Take 150 mg by mouth once.   Yes Historical Provider, MD  fluticasone (FLONASE) 50 MCG/ACT nasal spray Place 2 sprays into the nose at bedtime.    Yes Historical Provider, MD  Fluticasone-Salmeterol (ADVAIR) 100-50 MCG/DOSE AEPB Inhale 1 puff into the  lungs 2 (two) times daily.   Yes Historical Provider, MD  irbesartan (AVAPRO) 150 MG tablet Take 150 mg by mouth daily.   Yes Historical Provider, MD  niacin 500 MG tablet Take 500 mg by mouth daily with breakfast.   Yes Historical Provider, MD  ondansetron (ZOFRAN) 4 MG tablet Take 1 tablet (4 mg total) by mouth every 8 (eight) hours as needed for nausea or vomiting. 01/12/17  Yes Julianne Rice, MD  Potassium 99 MG TABS Take 99 mg by mouth daily.   Yes Historical Provider, MD  sodium chloride (OCEAN) 0.65 % SOLN nasal spray Place 1 spray into both nostrils as needed for congestion.   Yes Historical Provider, MD  sucralfate (CARAFATE) 1 GM/10ML suspension Take 10 mLs (1 g total) by mouth 4 (four) times daily -  with meals and at bedtime. 01/12/17  Yes Julianne Rice, MD  traMADol (ULTRAM) 50 MG tablet Take 50 mg by mouth every 6 (six) hours as needed for moderate pain or severe pain.    Yes Historical Provider, MD  vitamin C (ASCORBIC ACID) 500 MG tablet Take 500 mg by mouth daily.   Yes Historical Provider, MD  vitamin E 400 UNIT capsule Take 400 Units by mouth daily.   Yes Historical Provider, MD    Family History Family History  Problem Relation Age of Onset  . Varicose Veins Mother   . Diabetes Brother   . Heart disease Brother   . Hypertension Brother   . Varicose Veins Brother   . Hypertension Other   . Heart disease Other   . Glaucoma Other   . Diabetes Other   . Varicose Veins Sister   . Varicose Veins Daughter     Social History Social History  Substance Use Topics  . Smoking status: Never Smoker  . Smokeless tobacco: Never Used  . Alcohol use No     Allergies   Amoxicillin; Macrodantin [nitrofurantoin macrocrystal]; and Sulfa antibiotics   Review of Systems Review of Systems  Constitutional: Positive for chills. Negative for fever.  HENT: Positive for congestion, sinus pressure and sore throat. Negative for trouble swallowing.   Respiratory: Positive for cough,  shortness of breath and wheezing.   Cardiovascular: Negative for chest pain and leg swelling.  Gastrointestinal: Negative for abdominal pain, diarrhea, nausea and vomiting.  Genitourinary: Negative for dysuria.  All other systems reviewed and are negative.    Physical Exam Updated Vital Signs BP 139/61   Pulse 80   Temp 97.8 F (36.6 C) (Oral)   Resp 10   Ht 5\' 5"  (1.651 m)   Wt 185 lb (83.9 kg)   SpO2 99%   BMI 30.79 kg/m   Physical Exam  Constitutional: She is oriented to person, place, and time.  Elderly, no acute distress  HENT:  Head: Normocephalic and atraumatic.  Mouth/Throat: No oropharyngeal exudate.  Mild erythema posterior oropharynx, no tonsillar exudate, no tonsillar enlargement  Cardiovascular: Normal rate, regular rhythm and normal heart sounds.   Pulmonary/Chest: Effort normal. No respiratory distress. She has wheezes.  Occasional expiratory wheeze  Abdominal: Soft. Bowel sounds are normal. There is no tenderness.  Musculoskeletal: She exhibits no edema.  Neurological: She is alert and oriented to person, place, and time.  Skin: Skin is warm and dry.  Psychiatric: She has a normal mood and affect.  Nursing note and vitals reviewed.    ED Treatments / Results  Labs (all labs ordered are listed, but only abnormal results are displayed) Labs Reviewed  CBC WITH DIFFERENTIAL/PLATELET - Abnormal; Notable for the following:       Result Value   RBC 3.26 (*)    Hemoglobin 8.2 (*)    HCT 25.3 (*)    MCV 77.6 (*)    MCH 25.2 (*)    Lymphs Abs 0.5 (*)    All other components within normal limits  BASIC METABOLIC PANEL - Abnormal; Notable for the following:    Sodium 125 (*)    Chloride 92 (*)    Glucose, Bld 109 (*)    GFR calc non Af Amer 54 (*)    All other components within normal limits  INFLUENZA PANEL BY PCR (TYPE A & B) - Abnormal; Notable for the following:    Influenza A By PCR POSITIVE (*)    All other components within normal limits  BRAIN  NATRIURETIC PEPTIDE  I-STAT TROPOININ, ED    EKG  EKG Interpretation  Date/Time:  Monday January 16 2017 05:29:58 EST Ventricular Rate:  88 PR Interval:    QRS Duration: 83 QT Interval:  324 QTC Calculation: 392 R Axis:   48 Text Interpretation:  Sinus rhythm Abnormal R-wave progression, early transition ST elevation, consider inferior injury Confirmed by Kristian Mogg  MD, Talishia Betzler ([id number]) on 01/16/2017 6:00:00 AM       Radiology Dg Chest 2 View  Result Date: 01/16/2017 CLINICAL DATA:  83 y/o  F; sore throat, cough, and nasal congestion. EXAM: CHEST  2 VIEW COMPARISON:  04/09/2015 chest radiograph FINDINGS: Stable heart size and mediastinal contours are within normal limits. Aortic atherosclerosis with calcifications. Surgical clips in right upper quadrant, likely cholecystectomy. Partially visualized lumbar fusion hardware. Both lungs are clear. IMPRESSION: No active cardiopulmonary disease. Electronically Signed   By: Kristine Garbe M.D.   On: 01/16/2017 06:22    Procedures Procedures (including critical care time)  Medications Ordered in ED Medications  oseltamivir (TAMIFLU) capsule 75 mg (not administered)  ipratropium-albuterol (DUONEB) 0.5-2.5 (3) MG/3ML nebulizer solution 3 mL (3 mLs Nebulization Given 01/16/17 [id number])  sodium chloride 0.9 % bolus 1,000 mL (1,000 mLs Intravenous New Bag/Given 01/16/17 0739)  dexamethasone (DECADRON) injection 10 mg (10 mg Intravenous Given 01/16/17 0739)     Initial Impression / Assessment and Plan / ED Course  I have reviewed the triage vital signs and the nursing notes.  Pertinent labs & imaging results that were available during my care of the patient were reviewed by me and considered in my medical decision making (see chart for details).     She presents with upper respiratory symptoms. Recent evaluation for gastritis. She is overall nontoxic appearing but does have wheezing on exam and looks somewhat short of breath. Not  overtly volume overloaded.  EKG and troponin reassuring. Chest x-ray without pneumonia. She is hyponatremic and mildly worsened anemia on labs. She does report increased water intake. This could be somewhat delusional. Patient was given fluids. She is influenza positive. Patient given Tamiflu. Will admit to observation.  Discussed with Erin Hearing, PA.  Final Clinical Impressions(s) / ED Diagnoses   Final diagnoses:  Influenza  Hyponatremia    New Prescriptions New Prescriptions   No medications on file     Merryl Hacker, MD 01/16/17 [phone]

## 2017-01-16 NOTE — H&P (Signed)
History and Physical    Beverly Jimenez [id number] DOB: [birth year] DOA: 01/16/2017   PCP: Marton Redwood, MD   Patient coming from/Resides with: Private residence  Admission status: Observation/ telemetry-it may be/medically necessary to stay a minimum 2 midnights to rule out impending and/or unexpected changes in physiologic status that may differ from initial evaluation performed in the ER and/or at time of admission therefore consider reevaluation of admission status in 24 hours.   Chief Complaint: Dyspnea on exertion  HPI: Beverly Jimenez is a 83 y.o. female with medical history significant for hypertension, asthma, lower extremity varicosities and back pain. Patient was seen in the ER on 2/8 or abdominal pain and diagnosed with gastritis and resumed on Nexium and started on Carafate. Since that visit she reported UTI symptoms to her PCP who called her in Cipro and Diflucan. On Sunday 2/11 she began having sore throat cough and nasal congestion that progressed to shortness of breath primarily dyspnea on exertion overnight. She presented to the ER and was not hypoxemic and her chest x-ray was unremarkable but influenza A was positive. There were incidental findings of hyponatremia and slightly progressive anemia without GI bleeding symptoms with patient admitting to excess intake of water recently.  ED Course:  Vital Signs: BP 139/61   Pulse 80   Temp 97.8 F (36.6 C) (Oral)   Resp 10   Ht 5\' 5"  (1.651 m)   Wt 83.9 kg (185 lb)   SpO2 99%   BMI 30.79 kg/m  2 view chest x-ray: No active cardiopulmonary disease Lab data: Sodium 125, potassium 4.7, chloride 92, CO2 25, glucose 109, BUN 6, creatinine 0.91, BNP 69, poc troponin 0.00, white count 5100 with normal differential except for absolute lymphocytes 0.5, hemoglobin 8.2, MCV 77.6, platelets 212,000, influenza A positive Medications and treatments: DuoNeb 1, normal saline 1 L, Decadron IV 10 mg 1  Review of Systems:  In  addition to the HPI above,  No Headache, changes with Vision or hearing, new weakness, tingling, numbness in any extremity, dizziness, dysarthria or word finding difficulty, gait disturbance or imbalance, tremors or seizure activity No problems swallowing food or Liquids, indigestion/reflux, choking or coughing while eating, abdominal pain with or after eating No Chest pain, Cough, palpitations, orthopnea  No Abdominal pain, N/V, melena,hematochezia, dark tarry stools, constipation No dysuria, malodorous urine, hematuria or flank pain No new skin rashes, lesions, masses or bruises, No new joint pains, aches, swelling or redness No recent unintentional weight gain or loss No polyuria, polydypsia or polyphagia   Past Medical History:  Diagnosis Date  . Anemia   . Arthritis    shoulder and knee pain  . Asthma   . Asthma   . Depression    Hx; of situational depression  . GERD (gastroesophageal reflux disease)   . Glaucoma    Hx; of  . High cholesterol   . Myalgia   . Numbness and tingling    Hx: of B/LLE  . Osteoporosis   . Pneumonia    Hx: of "years ago"  . Shortness of breath     Past Surgical History:  Procedure Laterality Date  . APPENDECTOMY    . BACK SURGERY    . CHOLECYSTECTOMY    . COLONOSCOPY     Hx: of  . EYE SURGERY     catarac  . 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 HERNIA REPAIR    . LUMBAR LAMINECTOMY/DECOMPRESSION MICRODISCECTOMY Left 07/05/2013  Procedure: LUMBAR LAMINECTOMY/DECOMPRESSION MICRODISCECTOMY 1 LEVEL Left Lumbar five-Sacral one;  Surgeon: Charlie Pitter, MD;  Location: [facility];  Service: Neurosurgery;  Laterality: Left;  Left Lumbar Five-Sacral One Microdiskectomy  . VASCULAR SURGERY     varicose vein surgery    Social History   Social History  . Marital status: Widowed    Spouse name: N/A  . Number of children: N/A  . Years of education: N/A   Occupational History  . Not on file.   Social History Main Topics  .  Smoking status: Never Smoker  . Smokeless tobacco: Never Used  . Alcohol use No  . Drug use: No  . Sexual activity: Not on file   Other Topics Concern  . Not on file   Social History Narrative  . No narrative on file    Mobility: Utilizes a cane Work history: Not obtained   Allergies  Allergen Reactions  . Amoxicillin Hives, Itching and Rash    Has patient had a PCN reaction causing immediate rash, facial/tongue/throat swelling, SOB or lightheadedness with hypotension: Unknown Has patient had a PCN reaction causing severe rash involving mucus membranes or skin necrosis: Yes  Has patient had a PCN reaction that required hospitalization: No Has patient had a PCN reaction occurring within the last 10 years: No  If all of the above answers are "NO", then may proceed with Cephalosporin use.   Clancy Gourd [Nitrofurantoin Macrocrystal] Shortness Of Breath  . Sulfa Antibiotics Rash    Family History  Problem Relation Age of Onset  . Varicose Veins Mother   . Diabetes Brother   . Heart disease Brother   . Hypertension Brother   . Varicose Veins Brother   . Hypertension Other   . Heart disease Other   . Glaucoma Other   . Diabetes Other   . Varicose Veins Sister   . Varicose Veins Daughter      Prior to Admission medications   Medication Sig Start Date End Date Taking? Authorizing Provider  acetaminophen (TYLENOL) 500 MG tablet Take 500-1,000 mg by mouth every 4 (four) hours as needed for mild pain, moderate pain, fever or headache.    Yes Historical Provider, MD  albuterol (PROVENTIL HFA;VENTOLIN HFA) 108 (90 BASE) MCG/ACT inhaler Inhale 2 puffs into the lungs every 6 (six) hours as needed for wheezing or shortness of breath.    Yes Historical Provider, MD  aspirin EC 81 MG tablet Take 81 mg by mouth daily.   Yes Historical Provider, MD  bimatoprost (LUMIGAN) 0.01 % SOLN Place 1 drop into both eyes at bedtime.   Yes Historical Provider, MD  Calcium Carbonate-Vit D-Min  (CALTRATE PLUS PO) Take 2 tablets by mouth 2 (two) times daily.    Yes Historical Provider, MD  cetirizine (ZYRTEC) 10 MG tablet Take 10 mg by mouth daily.   Yes Historical Provider, MD  ciprofloxacin (CIPRO) 250 MG tablet Take 1 tablet (250 mg total) by mouth every 12 (twelve) hours. 01/12/17  Yes Julianne Rice, MD  Cyanocobalamin (VITAMIN B-12 PO) Place 1 tablet under the tongue daily.   Yes Historical Provider, MD  cyclobenzaprine (FLEXERIL) 10 MG tablet Take 0.5 tablets (5 mg total) by mouth 3 (three) times daily as needed for muscle spasms. 01/11/17  Yes Mcarthur Rossetti, MD  esomeprazole (NEXIUM) 40 MG capsule Take 40 mg by mouth daily before breakfast.   Yes Historical Provider, MD  fish oil-omega-3 fatty acids 1000 MG capsule Take 2 g by mouth 2 (two)  times daily.    Yes Historical Provider, MD  fluconazole (DIFLUCAN) 150 MG tablet Take 150 mg by mouth once.   Yes Historical Provider, MD  fluticasone (FLONASE) 50 MCG/ACT nasal spray Place 2 sprays into the nose at bedtime.    Yes Historical Provider, MD  Fluticasone-Salmeterol (ADVAIR) 100-50 MCG/DOSE AEPB Inhale 1 puff into the lungs 2 (two) times daily.   Yes Historical Provider, MD  irbesartan (AVAPRO) 150 MG tablet Take 150 mg by mouth daily.   Yes Historical Provider, MD  niacin 500 MG tablet Take 500 mg by mouth daily with breakfast.   Yes Historical Provider, MD  ondansetron (ZOFRAN) 4 MG tablet Take 1 tablet (4 mg total) by mouth every 8 (eight) hours as needed for nausea or vomiting. 01/12/17  Yes Julianne Rice, MD  Potassium 99 MG TABS Take 99 mg by mouth daily.   Yes Historical Provider, MD  sodium chloride (OCEAN) 0.65 % SOLN nasal spray Place 1 spray into both nostrils as needed for congestion.   Yes Historical Provider, MD  sucralfate (CARAFATE) 1 GM/10ML suspension Take 10 mLs (1 g total) by mouth 4 (four) times daily -  with meals and at bedtime. 01/12/17  Yes Julianne Rice, MD  traMADol (ULTRAM) 50 MG tablet Take 50 mg by  mouth every 6 (six) hours as needed for moderate pain or severe pain.    Yes Historical Provider, MD  vitamin C (ASCORBIC ACID) 500 MG tablet Take 500 mg by mouth daily.   Yes Historical Provider, MD  vitamin E 400 UNIT capsule Take 400 Units by mouth daily.   Yes Historical Provider, MD    Physical Exam: Vitals:   01/16/17 0539 01/16/17 0600 01/16/17 0700 01/16/17 0730  BP:  (!) 123/50 139/61 131/63  Pulse:  83 80 80  Resp:  10  16  Temp:      TempSrc:      SpO2:  97% 99% 99%  Weight: 83.9 kg (185 lb)     Height: 5\' 5"  (1.651 m)         Constitutional: NAD, calm, comfortable Eyes: PERRL, lids and conjunctivae normal ENMT: Mucous membranes are moist. Posterior pharynx clear of any exudate or lesions.Normal dentition.  Neck: normal, supple, no masses, no thyromegaly Respiratory: clear to auscultation bilaterally, no wheezing, no crackles. Normal respiratory effort. No accessory muscle use.  Cardiovascular: Regular rate and rhythm, no murmurs / rubs / gallops. No extremity edema. 2+ pedal pulses. No carotid bruits.  Abdomen: no tenderness, no masses palpated. No hepatosplenomegaly. Bowel sounds positive.  Musculoskeletal: no clubbing / cyanosis. No joint deformity upper and lower extremities. Good ROM, no contractures. Normal muscle tone.  Skin: no rashes, lesions, ulcers. No induration Neurologic: CN 2-12 grossly intact. Sensation intact, DTR normal. Strength 5/5 x all 4 extremities.  Psychiatric: Normal judgment and insight although seems to have some mild short-term memory deficits. Alert and oriented x 3. Normal mood.    Labs on Admission: I have personally reviewed following labs and imaging studies  CBC:  Recent Labs Lab 01/12/17 1024 01/16/17 0538  WBC 6.4 5.1  NEUTROABS 4.2 3.6  HGB 9.2* 8.2*  HCT 27.5* 25.3*  MCV 77.9* 77.6*  PLT 278 [id number]   Basic Metabolic Panel:  Recent Labs Lab 01/12/17 1024 01/16/17 0538  NA 130* 125*  K 4.3 4.7  CL 96* 92*  CO2 26 25   GLUCOSE 112* 109*  BUN 13 6  CREATININE 0.87 0.91  CALCIUM 9.9 9.3   GFR:  Estimated Creatinine Clearance: 44.9 mL/min (by C-G formula based on SCr of 0.91 mg/dL). Liver Function Tests:  Recent Labs Lab 01/12/17 1024  AST 21  ALT 14  ALKPHOS 50  BILITOT 0.8  PROT 7.2  ALBUMIN 4.2    Recent Labs Lab 01/12/17 1024  LIPASE 23   No results for input(s): AMMONIA in the last 168 hours. Coagulation Profile: No results for input(s): INR, PROTIME in the last 168 hours. Cardiac Enzymes: No results for input(s): CKTOTAL, CKMB, CKMBINDEX, TROPONINI in the last 168 hours. BNP (last 3 results) No results for input(s): PROBNP in the last 8760 hours. HbA1C: No results for input(s): HGBA1C in the last 72 hours. CBG: No results for input(s): GLUCAP in the last 168 hours. Lipid Profile: No results for input(s): CHOL, HDL, LDLCALC, TRIG, CHOLHDL, LDLDIRECT in the last 72 hours. Thyroid Function Tests: No results for input(s): TSH, T4TOTAL, FREET4, T3FREE, THYROIDAB in the last 72 hours. Anemia Panel:  Recent Labs  01/16/17 0818  RETICCTPCT 1.8   Urine analysis:    Component Value Date/Time   COLORURINE YELLOW 01/12/2017 1120   APPEARANCEUR CLEAR 01/12/2017 1120   LABSPEC 1.017 01/12/2017 1120   PHURINE 7.0 01/12/2017 1120   GLUCOSEU NEGATIVE 01/12/2017 1120   HGBUR NEGATIVE 01/12/2017 1120   BILIRUBINUR NEGATIVE 01/12/2017 1120   KETONESUR NEGATIVE 01/12/2017 1120   PROTEINUR NEGATIVE 01/12/2017 1120   NITRITE NEGATIVE 01/12/2017 1120   LEUKOCYTESUR TRACE (A) 01/12/2017 1120   Sepsis Labs: @LABRCNTIP (procalcitonin:4,lacticidven:4) )No results found for this or any previous visit (from the past 240 hour(s)).   Radiological Exams on Admission: Dg Chest 2 View  Result Date: 01/16/2017 CLINICAL DATA:  83 y/o  F; sore throat, cough, and nasal congestion. EXAM: CHEST  2 VIEW COMPARISON:  04/09/2015 chest radiograph FINDINGS: Stable heart size and mediastinal contours are  within normal limits. Aortic atherosclerosis with calcifications. Surgical clips in right upper quadrant, likely cholecystectomy. Partially visualized lumbar fusion hardware. Both lungs are clear. IMPRESSION: No active cardiopulmonary disease. Electronically Signed   By: Kristine Garbe M.D.   On: 01/16/2017 06:22    EKG: (Independently reviewed) sinus rhythm with ventricular rate 88 bpm, QTC 392 ms, normal R-wave rotation, no ischemic changes  Assessment/Plan Principal Problem:   Influenza A/Asthma - patient presents with dyspnea on exertion without acute infiltrate on chest x-ray with subsequent finding of influenza A positive status -Continue Tamiflu-pharmacy to assist with correct dosing given patient's advanced age -Continue supportive care -Check RA ambulatory oximetry to determine if hypoxemic with activity -Has underlying asthma and will continue preadmission albuterol MDI and Advair -No focal infiltrate on chest x-ray so no indication to treat as pneumonia process  Active Problems:   Microcytic anemia -Hgb 4 days ago was 9.2 and was 9.02 years ago -Current hemoglobin 8.2 -Patient admits to excessive free water intake so this may just be dilutional -For completeness of exam check anemia panel, TSH and FOB -CBC in a.m. -Recent diagnosis of gastritis so continue Nexium and Carafate -Reports outpatient colonoscopy in the past 3 months/[facility]-may need to follow up with GI outpatient if gastritis symptoms not improved after appropriate medical therapy    Acute hyponatremia -Current sodium 125 and sodium was 130 4 days ago and had similar reading 2 years ago with normal readings 4 years ago -Has been given a liter of fluids in ER; given underlying influenza and respiratory symptoms will continue saline at 50/hr -We'll completeness of exam check urine sodium, urine osmolality and serum osmolality -BMET in  a.m. -Started on regular diet but may need fluid restriction -Not on  offending medications  **Serum osmo 263 so have decreased IVFs to 10/hr and added a 1500cc/24 hrs fluid restriction    HTN (hypertension) -Currently controlled -Continue Avapro    Dyslipidemia -Continue niacin and omega-3 fatty acids    UTI (urinary tract infection) -Continue preadmission Diflucan and Cipro      DVT prophylaxis: Lovenox Code Status: Full  Family Communication: Daughter at bedside Disposition Plan: Anticipate discharge back to preadmission home environment when medically stable Consults called: None    ELLIS,ALLISON L. ANP-BC [facility] Pager [phone]   If 7PM-7AM, please contact night-coverage [email or web address] Password [facility]  01/16/2017, 8:55 AM

## 2017-01-16 NOTE — ED Notes (Signed)
Pt to xray via stretcher

## 2017-01-16 NOTE — Progress Notes (Signed)
SATURATION QUALIFICATIONS: (This note is used to comply with regulatory documentation for home oxygen)  Patient Saturations on Room Air at Rest = 100%  Patient Saturations on Room Air while Ambulating = 100%   Patient Saturations on 0 Liters of oxygen while Ambulating = 100%  Please briefly explain why patient needs home oxygen: pt does not need oxygen

## 2017-01-17 DIAGNOSIS — Z79899 Other long term (current) drug therapy: Secondary | ICD-10-CM | POA: Diagnosis not present

## 2017-01-17 DIAGNOSIS — I10 Essential (primary) hypertension: Secondary | ICD-10-CM

## 2017-01-17 DIAGNOSIS — Z833 Family history of diabetes mellitus: Secondary | ICD-10-CM | POA: Diagnosis not present

## 2017-01-17 DIAGNOSIS — J101 Influenza due to other identified influenza virus with other respiratory manifestations: Secondary | ICD-10-CM | POA: Diagnosis not present

## 2017-01-17 DIAGNOSIS — N390 Urinary tract infection, site not specified: Secondary | ICD-10-CM | POA: Diagnosis present

## 2017-01-17 DIAGNOSIS — K219 Gastro-esophageal reflux disease without esophagitis: Secondary | ICD-10-CM | POA: Diagnosis present

## 2017-01-17 DIAGNOSIS — R0602 Shortness of breath: Secondary | ICD-10-CM | POA: Diagnosis not present

## 2017-01-17 DIAGNOSIS — Z7982 Long term (current) use of aspirin: Secondary | ICD-10-CM | POA: Diagnosis not present

## 2017-01-17 DIAGNOSIS — J45909 Unspecified asthma, uncomplicated: Secondary | ICD-10-CM | POA: Diagnosis present

## 2017-01-17 DIAGNOSIS — E871 Hypo-osmolality and hyponatremia: Secondary | ICD-10-CM | POA: Diagnosis not present

## 2017-01-17 DIAGNOSIS — D638 Anemia in other chronic diseases classified elsewhere: Secondary | ICD-10-CM | POA: Diagnosis present

## 2017-01-17 DIAGNOSIS — Z8249 Family history of ischemic heart disease and other diseases of the circulatory system: Secondary | ICD-10-CM | POA: Diagnosis not present

## 2017-01-17 DIAGNOSIS — E785 Hyperlipidemia, unspecified: Secondary | ICD-10-CM | POA: Diagnosis present

## 2017-01-17 DIAGNOSIS — J209 Acute bronchitis, unspecified: Secondary | ICD-10-CM

## 2017-01-17 DIAGNOSIS — J111 Influenza due to unidentified influenza virus with other respiratory manifestations: Secondary | ICD-10-CM | POA: Diagnosis present

## 2017-01-17 DIAGNOSIS — Z9049 Acquired absence of other specified parts of digestive tract: Secondary | ICD-10-CM | POA: Diagnosis not present

## 2017-01-17 DIAGNOSIS — K2970 Gastritis, unspecified, without bleeding: Secondary | ICD-10-CM | POA: Diagnosis present

## 2017-01-17 LAB — COMPREHENSIVE METABOLIC PANEL
ALT: 15 U/L (ref 14–54)
AST: 25 U/L (ref 15–41)
Albumin: 3.1 g/dL — ABNORMAL LOW (ref 3.5–5.0)
Alkaline Phosphatase: 43 U/L (ref 38–126)
Anion gap: 9 (ref 5–15)
BUN: 15 mg/dL (ref 6–20)
CHLORIDE: 95 mmol/L — AB (ref 101–111)
CO2: 23 mmol/L (ref 22–32)
Calcium: 8.9 mg/dL (ref 8.9–10.3)
Creatinine, Ser: 0.87 mg/dL (ref 0.44–1.00)
GFR calc Af Amer: >60 mL/min (ref >60)
GFR, EST NON AFRICAN AMERICAN: 57 mL/min — AB (ref >60)
Glucose, Bld: 121 mg/dL — ABNORMAL HIGH (ref 65–99)
POTASSIUM: 4.6 mmol/L (ref 3.5–5.1)
SODIUM: 127 mmol/L — AB (ref 135–145)
Total Bilirubin: 0.3 mg/dL (ref 0.3–1.2)
Total Protein: 6 g/dL — ABNORMAL LOW (ref 6.5–8.1)

## 2017-01-17 LAB — CBC
HCT: 23.9 % — ABNORMAL LOW (ref 36.0–46.0)
Hemoglobin: 7.9 g/dL — ABNORMAL LOW (ref 12.0–15.0)
MCH: 25.3 pg — ABNORMAL LOW (ref 26.0–34.0)
MCHC: 33.1 g/dL (ref 30.0–36.0)
MCV: 76.6 fL — AB (ref 78.0–100.0)
PLATELETS: 229 10*3/uL (ref 150–400)
RBC: 3.12 MIL/uL — ABNORMAL LOW (ref 3.87–5.11)
RDW: 13.8 % (ref 11.5–15.5)
WBC: 4.3 10*3/uL (ref 4.0–10.5)

## 2017-01-17 MED ORDER — METHYLPREDNISOLONE SODIUM SUCC 40 MG IJ SOLR
40.0000 mg | Freq: Two times a day (BID) | INTRAMUSCULAR | Status: DC
  Administered 2017-01-17 – 2017-01-18 (×3): 40 mg via INTRAVENOUS
  Filled 2017-01-17 (×3): qty 1

## 2017-01-17 MED ORDER — FUROSEMIDE 10 MG/ML IJ SOLN
20.0000 mg | Freq: Once | INTRAMUSCULAR | Status: AC
  Administered 2017-01-17: 20 mg via INTRAVENOUS
  Filled 2017-01-17: qty 2

## 2017-01-17 MED ORDER — ALBUTEROL SULFATE (2.5 MG/3ML) 0.083% IN NEBU
2.5000 mg | INHALATION_SOLUTION | RESPIRATORY_TRACT | Status: DC | PRN
  Administered 2017-01-18: 2.5 mg via RESPIRATORY_TRACT
  Filled 2017-01-17: qty 3

## 2017-01-17 NOTE — Progress Notes (Signed)
Physical Therapy Evaluation Patient Details Name: Beverly Jimenez MRN: [id number] DOB: [birth year] Today's Date: 01/17/2017   History of Present Illness  Patient presnts with the flu. She has had falls and fractures in the past. She was recieving home health 2nd to arecent fall.   Clinical Impression  Patient presents with decreased endurance with gait. She would benefit from a return to home therapy for endurance and strengthening. Acute therapy will work with her while she is in the hospital to increase her gait distance.     Follow Up Recommendations Home health PT    Equipment Recommendations  Rolling walker with 5" wheels    Recommendations for Other Services       Precautions / Restrictions Precautions Precautions: Fall Restrictions Weight Bearing Restrictions: No      Mobility  Bed Mobility Overal bed mobility: Independent             General bed mobility comments: No difficulty getting out of bed   Transfers Overall transfer level: Needs assistance Equipment used: Rolling walker (2 wheeled) Transfers: Sit to/from Stand Sit to Stand: Min guard         General transfer comment: Sit to stand 2x with min gaurd for balance upon initial standing. Patient able to transferto commode and back with no loss of balance.   Ambulation/Gait Ambulation/Gait assistance: Supervision Ambulation Distance (Feet): 30 Feet Assistive device: Rolling walker (2 wheeled)     Gait velocity interpretation: Below normal speed for age/gender General Gait Details: slow cadence but overall steady with gait. Cuing to stay in the walker with turns  Stairs            Wheelchair Mobility    Modified Rankin (Stroke Patients Only)       Balance                                             Pertinent Vitals/Pain Pain Assessment: No/denies pain    Home Living Family/patient expects to be discharged to:: Private residence Living Arrangements:  Alone Available Help at Discharge: Available PRN/intermittently Type of Home: House Home Access: Level entry              Prior Function Level of Independence: Independent with assistive device(s)         Comments: used a walker. Was working on home mobility     Hand Dominance   Dominant Hand: Right    Extremity/Trunk Assessment   Upper Extremity Assessment Upper Extremity Assessment: Overall WFL for tasks assessed    Lower Extremity Assessment Lower Extremity Assessment: Overall WFL for tasks assessed       Communication   Communication: No difficulties  Cognition Arousal/Alertness: Awake/alert Behavior During Therapy: WFL for tasks assessed/performed Overall Cognitive Status: Within Functional Limits for tasks assessed                      General Comments      Exercises     Assessment/Plan    PT Assessment Patient needs continued PT services  PT Problem List Decreased strength;Decreased range of motion;Decreased activity tolerance;Decreased balance;Decreased mobility;Decreased coordination;Decreased safety awareness;Pain          PT Treatment Interventions Gait training;Stair training;Functional mobility training;Therapeutic activities;Therapeutic exercise;Balance training;Neuromuscular re-education;Patient/family education;DME instruction    PT Goals (Current goals can be found in the Care Plan section)  Acute Rehab PT  Goals Patient Stated Goal: to go home  PT Goal Formulation: With patient Time For Goal Achievement: 01/31/17    Frequency Min 3X/week   Barriers to discharge   none     Co-evaluation               End of Session Equipment Utilized During Treatment: Gait belt Activity Tolerance: Patient tolerated treatment well Patient left: in bed;with call bell/phone within reach Nurse Communication: Mobility status    Functional Assessment Tool Used: clinical decision making  Functional Limitation: Mobility: Walking and  moving around Mobility: Walking and Moving Around Current Status [id number]): At least 20 percent but less than 40 percent impaired, limited or restricted Mobility: Walking and Moving Around Goal Status [phone]): At least 1 percent but less than 20 percent impaired, limited or restricted    Time: 0340-0400 PT Time Calculation (min) (ACUTE ONLY): 20 min   Charges:   PT Evaluation $PT Eval Low Complexity: 1 Procedure     PT G Codes:   PT G-Codes **NOT FOR INPATIENT CLASS** Functional Assessment Tool Used: clinical decision making  Functional Limitation: Mobility: Walking and moving around Mobility: Walking and Moving Around Current Status [id number]): At least 20 percent but less than 40 percent impaired, limited or restricted Mobility: Walking and Moving Around Goal Status [phone]): At least 1 percent but less than 20 percent impaired, limited or restricted    Carney Living  PT DPT  01/17/2017, 5:44 PM

## 2017-01-17 NOTE — Care Management Obs Status (Signed)
[facility] NOTIFICATION   Patient Details  Name: CORALEI TYRELL MRN: [id number] Date of Birth: 11-Feb-1925   Medicare Observation Status Notification Given:  Yes    Marilu Favre, RN 01/17/2017, 2:21 PM

## 2017-01-17 NOTE — Progress Notes (Signed)
PROGRESS NOTE        PATIENT DETAILS Name: Beverly Jimenez Age: 83 y.o. Sex: female Date of Birth: 02-06-1925 Admit Date: 01/16/2017 Admitting Physician Waldemar Dickens, MD [id number], Gwyndolyn Saxon, MD  Brief Narrative: Patient is a 83 y.o. female with history of HTN, asthma, GERD, and anemia. She presented to the ED on 2/12 complaining of worsening SOB and sore throat for past 2 days. She has been diagnosed with influenza A and acute hyponatremia.   Subjective: She is doing well this morning, denies dyspnea. She is eating regular diet.   Assessment/Plan: Acute Bronchitis due to Influenza A: Better-but wheezing this morning.Continue Tamiflu-add Solumedrol,bronchodilators and incentive spirometry. Follow clinical course  Hyponatremia: Due to excessive free water intake-could have underlying SIADH as well-she appears euvolemic as well. Fluid restrict-one dose of IV Lasix today. Will monitor sodium levels.   Microcytic Anemia: probably due to chronic Fe def and anemia of chronic disease. Hb decreased due to IVF/Hemodilution. No indication of overt blood loss. Plans are to follow hemoglobin  UTI: diagnosed as outpatient-continue Cipro/Diflucan  Hypertension: Continue Avapro  Asthma: see above  GERD: Continue Protonix and sucralfate   DVT Prophylaxis: Lovenox daily  Code Status: Full code  Family Communication: Daughter at bedside  Disposition Plan: Remain inpatient-but will plan on Home health on discharge-likely 2/14  Antimicrobial agents: Anti-infectives    Start     Dose/Rate Route Frequency Ordered Stop   01/16/17 1100  oseltamivir (TAMIFLU) capsule 30 mg     30 mg Oral 2 times daily 01/16/17 1031 01/21/17 0959   01/16/17 1000  fluconazole (DIFLUCAN) tablet 150 mg     150 mg Oral  Once 01/16/17 0913 01/16/17 1759   01/16/17 1000  ciprofloxacin (CIPRO) tablet 250 mg     250 mg Oral 2 times daily 01/16/17 0913     01/16/17 0900  oseltamivir  (TAMIFLU) capsule 30 mg  Status:  Discontinued     30 mg Oral  Once 01/16/17 0849 01/16/17 1031   01/16/17 0800  oseltamivir (TAMIFLU) capsule 75 mg  Status:  Discontinued     75 mg Oral  Once 01/16/17 0753 01/16/17 0849      Procedures: None  CONSULTS:  None  Time spent: 25 minutes-Greater than 50% of this time was spent in counseling, explanation of diagnosis, planning of further management, and coordination of care.  MEDICATIONS: Scheduled Meds: . aspirin EC  81 mg Oral Daily  . ciprofloxacin  250 mg Oral BID  . enoxaparin (LOVENOX) injection  40 mg Subcutaneous Q24H  . fluticasone  2 spray Each Nare QHS  . irbesartan  150 mg Oral Daily  . latanoprost  1 drop Both Eyes QHS  . loratadine  10 mg Oral Daily  . mometasone-formoterol  2 puff Inhalation BID  . niacin  500 mg Oral Q breakfast  . omega-3 acid ethyl esters  2 g Oral BID  . oseltamivir  30 mg Oral BID  . pantoprazole  40 mg Oral Daily  . sucralfate  1 g Oral TID WC & HS   Continuous Infusions: . sodium chloride 10 mL/hr at 01/16/17 1052   PRN Meds:.acetaminophen **OR** acetaminophen, albuterol, cyclobenzaprine, ondansetron **OR** ondansetron (ZOFRAN) IV, sodium chloride, traMADol   PHYSICAL EXAM: Vital signs: Vitals:   01/16/17 1317 01/16/17 2024 01/16/17 2032 01/17/17 0438  BP: (!) 138/54 (!) 122/57  Marland Kitchen)  168/63  Pulse: 86 75  82  Resp: 17 18  17   Temp: 99.1 F (37.3 C) 97.9 F (36.6 C)  98.2 F (36.8 C)  TempSrc: Oral Oral  Oral  SpO2: 98% 98% 97% 99%  Weight:      Height:       Filed Weights   01/16/17 0539  Weight: 83.9 kg (185 lb)   Body mass index is 30.79 kg/m.   General appearance: Awake, alert, not in any distress. Speech Clear. Not toxic Looking Eyes:, pupils equally reactive to light and accomodation,no scleral icterus.Pink conjunctiva HEENT: Atraumatic and Normocephalic Neck: supple, no JVD. No cervical lymphadenopathy. No thyromegaly Resp: Good air entry bilaterally, coarse  wheezing bilaterally  CVS: S1 S2 regular, no murmurs.  GI: Bowel sounds present, Non tender and not distended with no gaurding, rigidity or rebound.No organomegaly Extremities: B/L Lower Ext shows no edema, both legs are warm to touch Neurology:  speech clear,Non focal, sensation is grossly intact. Psychiatric: Normal judgment and insight. Alert and oriented x 3. Normal mood. Musculoskeletal:No digital cyanosis Skin:No Rash, warm and dry Wounds:N/A  I have personally reviewed following labs and imaging studies  LABORATORY DATA: CBC:  Recent Labs Lab 01/12/17 1024 01/16/17 0538 01/17/17 0616  WBC 6.4 5.1 4.3  NEUTROABS 4.2 3.6  --   HGB 9.2* 8.2* 7.9*  HCT 27.5* 25.3* 23.9*  MCV 77.9* 77.6* 76.6*  PLT 278 212 [id number]    Basic Metabolic Panel:  Recent Labs Lab 01/12/17 1024 01/16/17 0538 01/17/17 0616  NA 130* 125* 127*  K 4.3 4.7 4.6  CL 96* 92* 95*  CO2 26 25 23   GLUCOSE 112* 109* 121*  BUN 13 6 15   CREATININE 0.87 0.91 0.87  CALCIUM 9.9 9.3 8.9    GFR: Estimated Creatinine Clearance: 46.9 mL/min (by C-G formula based on SCr of 0.87 mg/dL).  Liver Function Tests:  Recent Labs Lab 01/12/17 1024 01/17/17 0616  AST 21 25  ALT 14 15  ALKPHOS 50 43  BILITOT 0.8 0.3  PROT 7.2 6.0*  ALBUMIN 4.2 3.1*    Recent Labs Lab 01/12/17 1024  LIPASE 23   No results for input(s): AMMONIA in the last 168 hours.  Coagulation Profile: No results for input(s): INR, PROTIME in the last 168 hours.  Cardiac Enzymes: No results for input(s): CKTOTAL, CKMB, CKMBINDEX, TROPONINI in the last 168 hours.  BNP (last 3 results) No results for input(s): PROBNP in the last 8760 hours.  HbA1C: No results for input(s): HGBA1C in the last 72 hours.  CBG: No results for input(s): GLUCAP in the last 168 hours.  Lipid Profile: No results for input(s): CHOL, HDL, LDLCALC, TRIG, CHOLHDL, LDLDIRECT in the last 72 hours.  Thyroid Function Tests:  Recent Labs  01/16/17 0818    TSH 1.450    Anemia Panel:  Recent Labs  01/16/17 0818  VITAMINB12 2,825*  FOLATE 8.9  FERRITIN 70  TIBC 307  IRON 14*  RETICCTPCT 1.8    Urine analysis:    Component Value Date/Time   COLORURINE YELLOW 01/12/2017 1120   APPEARANCEUR CLEAR 01/12/2017 1120   LABSPEC 1.017 01/12/2017 1120   PHURINE 7.0 01/12/2017 1120   GLUCOSEU NEGATIVE 01/12/2017 1120   HGBUR NEGATIVE 01/12/2017 1120   BILIRUBINUR NEGATIVE 01/12/2017 1120   KETONESUR NEGATIVE 01/12/2017 1120   PROTEINUR NEGATIVE 01/12/2017 1120   NITRITE NEGATIVE 01/12/2017 1120   LEUKOCYTESUR TRACE (A) 01/12/2017 1120    Sepsis Labs: Lactic Acid, Venous No results found for: LATICACIDVEN  MICROBIOLOGY: No results found for this or any previous visit (from the past 240 hour(s)).  RADIOLOGY STUDIES/RESULTS: Dg Chest 2 View  Result Date: 01/16/2017 CLINICAL DATA:  83 y/o  F; sore throat, cough, and nasal congestion. EXAM: CHEST  2 VIEW COMPARISON:  04/09/2015 chest radiograph FINDINGS: Stable heart size and mediastinal contours are within normal limits. Aortic atherosclerosis with calcifications. Surgical clips in right upper quadrant, likely cholecystectomy. Partially visualized lumbar fusion hardware. Both lungs are clear. IMPRESSION: No active cardiopulmonary disease. Electronically Signed   By: Kristine Garbe M.D.   On: 01/16/2017 06:22   US Abdomen Limited  Result Date: 01/12/2017 CLINICAL DATA:  Abdominal pain and nausea for 1 week. EXAM: US ABDOMEN LIMITED - RIGHT UPPER QUADRANT COMPARISON:  09/26/2014 CT abdomen FINDINGS: Gallbladder: Surgically absent Common bile duct: Diameter: 5 mm Liver: No focal lesion identified. Within normal limits in parenchymal echogenicity. IMPRESSION: 1. No significant abnormality of the liver or visualized biliary tree identified. Gallbladder surgically absent. Electronically Signed   By: Van Clines M.D.   On: 01/12/2017 11:16   Xr Cervical Spine 2 Or 3  Views  Result Date: 01/11/2017 An AP and lateral cervical spine does show some degenerative changes in the cervical spine and some loss of her cervical lordosis but otherwise no acute changes or malalignment issues.  Xr Shoulder Left  Result Date: 01/11/2017 3 views of her left shoulder AP, outlet, axillary show no acute changes. The shoulders actually very well located in a good position. There is no evidence of rotator cuff arthropathy. Some mild before meals joint arthritis.    LOS: 0 days   Kyra Leyland, PA-S  [facility] Pager:[phone]  If 7PM-7AM, please contact night-coverage [email or web address] Password [facility] 01/17/2017, 10:48 AM  Attending MD note  Patient was seen, examined,treatment plan was discussed with the PA-S.  I have personally reviewed the clinical findings, lab, imaging studies and management of this patient in detail. I agree with the documentation, as recorded by the PA-S.   Patient is better but is wheezing today.   On Exam: Gen. exam: Awake, alert, not in any distress Chest: Good air entry bilaterally, + coarse rhonchi all over CVS: S1-S2 regular, no murmurs Abdomen: Soft, nontender and nondistended Neurology: Non-focal Skin: No rash or lesions   Acute Bronchitis with Influenza Hyponatremia HTN Microcytic Anemia  Plan Add IV solumedrol, continue Nebs-Tamiflu One dose of IV Lasix Continue bronchodilators If clinically improved-home on 2/14  Rest as above  [facility]

## 2017-01-17 NOTE — Care Management Note (Signed)
Case Management Note  Patient Details  Name: Beverly Jimenez MRN: [id number] Date of Birth: 01-03-1925  Subjective/Objective:                    Action/Plan:  Await PT eval . Spoke with patient regarding home health and DME. Patient states she is active with a home health agency currently but is unsure the name of the agency.  Provided [employer] . Patient stated Dr Raul Del office arranged home health. Called Dr Raul Del office transferred to [employer] awaiting call back.   Patient has a cane at home already , no 3 in 1 or walker will await PT recommendations. Expected Discharge Date:                  Expected Discharge Plan:  [place]  In-House Referral:     Discharge planning Services  CM Consult  Post Acute Care Choice:  Home Health Choice offered to:  Patient  DME Arranged:    DME Agency:     HH Arranged:    [place] Agency:     Status of Service:  In process, will continue to follow  If discussed at Long Length of Stay Meetings, dates discussed:    Additional Comments:  Marilu Favre, RN 01/17/2017, 2:35 PM

## 2017-01-18 LAB — BASIC METABOLIC PANEL
Anion gap: 10 (ref 5–15)
BUN: 20 mg/dL (ref 6–20)
CALCIUM: 9.3 mg/dL (ref 8.9–10.3)
CO2: 23 mmol/L (ref 22–32)
CREATININE: 0.93 mg/dL (ref 0.44–1.00)
Chloride: 97 mmol/L — ABNORMAL LOW (ref 101–111)
GFR calc non Af Amer: 53 mL/min — ABNORMAL LOW (ref >60)
Glucose, Bld: 131 mg/dL — ABNORMAL HIGH (ref 65–99)
Potassium: 4.6 mmol/L (ref 3.5–5.1)
SODIUM: 130 mmol/L — AB (ref 135–145)

## 2017-01-18 LAB — CBC
HCT: 27.9 % — ABNORMAL LOW (ref 36.0–46.0)
Hemoglobin: 9.2 g/dL — ABNORMAL LOW (ref 12.0–15.0)
MCH: 25.6 pg — AB (ref 26.0–34.0)
MCHC: 33 g/dL (ref 30.0–36.0)
MCV: 77.5 fL — AB (ref 78.0–100.0)
PLATELETS: 251 10*3/uL (ref 150–400)
RBC: 3.6 MIL/uL — AB (ref 3.87–5.11)
RDW: 14.3 % (ref 11.5–15.5)
WBC: 5.2 10*3/uL (ref 4.0–10.5)

## 2017-01-18 MED ORDER — GUAIFENESIN ER 600 MG PO TB12: 600.0000 mg | ORAL_TABLET | Freq: Two times a day (BID) | ORAL | 0 refills | Status: DC

## 2017-01-18 MED ORDER — BENZONATATE 100 MG PO CAPS: 200.0000 mg | ORAL_CAPSULE | Freq: Two times a day (BID) | ORAL | 0 refills | Status: DC | PRN

## 2017-01-18 MED ORDER — OSELTAMIVIR PHOSPHATE 30 MG PO CAPS: 30.0000 mg | ORAL_CAPSULE | Freq: Two times a day (BID) | ORAL | 0 refills | Status: DC

## 2017-01-18 MED ORDER — PREDNISONE 10 MG PO TABS: ORAL_TABLET | ORAL | 0 refills | Status: DC

## 2017-01-18 MED ORDER — ALBUTEROL SULFATE HFA 108 (90 BASE) MCG/ACT IN AERS: 2.0000 | INHALATION_SPRAY | RESPIRATORY_TRACT | 0 refills | Status: AC | PRN

## 2017-01-18 NOTE — Progress Notes (Signed)
D/C papers gone over with pt. Prescriptions given to pt. NO questions/complaints. IV taken out. Pt. D/c'd successfully via w/c with her daugther.

## 2017-01-18 NOTE — Care Management Note (Signed)
Case Management Note  Patient Details  Name: Beverly Jimenez MRN: [id number] Date of Birth: May 13, 1925  Subjective/Objective:                    Action/Plan: Confirmed with Maudie Mercury at Dr Raul Del office , patient is active with [facility] , patient wants to continue with [facility]. Referral given to [facility] with Nanine Means. Walker ordered.   Expected Discharge Date:                  Expected Discharge Plan:  [place]  In-House Referral:     Discharge planning Services  CM Consult  Post Acute Care Choice:  Home Health Choice offered to:  Patient  DME Arranged:  Walker rolling DME Agency:  [place]:  PT [facility] Agency:     Status of Service:  Completed, signed off  If discussed at [place] of Stay Meetings, dates discussed:    Additional Comments:  Marilu Favre, RN 01/18/2017, 8:31 AM

## 2017-01-18 NOTE — Discharge Summary (Signed)
PATIENT DETAILS Name: Beverly Jimenez Age: 83 y.o. Sex: female Date of Birth: 1925/10/29 MRN: [id number]. Admitting Physician: Waldemar Dickens, MD [id number], Gwyndolyn Saxon, MD  Admit Date: 01/16/2017 Discharge date: 01/18/2017  Recommendations for Outpatient Follow-up:  1. Follow up with PCP in 1-2 weeks 2. Please obtain BMP/CBC in one week 3. Please follow chemistries (mild hyponatremia) and follow CBC (microcytic anemia)   Admitted From:  Home  Disposition: Home with home health services    Home Health: Yes  Equipment/Devices: None  Discharge Condition: Stable  CODE STATUS: FULL CODE  Diet recommendation:  Heart Healthy  Brief Summary: See H&P, Labs, Consult and Test reports for all details in brief,Patient is a 83 y.o. female with history of HTN, asthma, GERD, and anemia. She presented to the ED on 2/12 complaining of worsening SOB and sore throat for past 2 days. She was found to have acute bronchitis secondary to influenza along with mild hyponatremia. She was subsequently admitted for further evaluation and treatment.   Brief Hospital Course: Acute Bronchitis due to Influenza A: Significantly improved, no rhonchi this morning. She continues to have some cough which I will suspect will persist for the next few days or week. Stop IV Solu-Medrol, start tapering prednisone. Plans are to continue Tamiflu for a 5 day course. Since significantly improved, she is stable to be discharged home today. Continue her usual bronchodilator regimen. She has been asked to follow-up with her PCP within one week.  Hyponatremia: Due to excessive free water intake-could have underlying SIADH as well-she appears euvolemic as well. Educated patient and daughter yesterday about importance of not consuming excessive free water-she did receive 1 dose of intravenous Lasix on 2/13. Sodium is up to 130 of the day of discharge. Again encouraged fluid restriction today as well. PCP to follow  electrolytes closely in the outpatient setting.   Microcytic Anemia: probably due to chronic Fe def and anemia of chronic disease. Hb decreased due to IVF/Hemodilution. No indication of overt blood loss. Plans are to follow hemoglobin in the outpatient setting-hemoglobin stable at 9.2 on discharge.  UTI: diagnosed as outpatient-and was on Cipro/Diflucan prior to this admission-this was briefly continued-she will however on discharge this has been discontinued-she seems to have completed more than a 3 day course. She does not have any signs of UTI at this time. I do not see any signs of oral thrush as well.  Hypertension: Continue Avapro  Asthma: see above-lung exam is much better-she is to continue her usual bronchodilator regimen  GERD: Continue  sucralfate   Procedures/Studies: none  Discharge Diagnoses:  Principal Problem:   Influenza A Active Problems:   HTN (hypertension)   Microcytic anemia   Acute hyponatremia   Asthma   UTI (urinary tract infection)   Anemia   Acute Bronchitis   Influenza   Discharge Instructions:  Activity:  As tolerated with Full fall precautions use walker/cane & assistance as needed  Discharge Instructions    Diet - low sodium heart healthy    Complete by:  As directed    Discharge instructions    Complete by:  As directed    Follow with Primary MD  Marton Redwood, MD in 1 week  Please get a complete blood count and chemistry panel checked by your Primary MD at your next visit, and again as instructed by your Primary MD.  Get Medicines reviewed and adjusted: Please take all your medications with you for your next visit with your Primary MD  Laboratory/radiological  data: Please request your Primary MD to go over all hospital tests and procedure/radiological results at the follow up, please ask your Primary MD to get all Hospital records sent to his/her office.  In some cases, they will be blood work, cultures and biopsy results pending  at the time of your discharge. Please request that your primary care M.D. follows up on these results.  Also Note the following: If you experience worsening of your admission symptoms, develop shortness of breath, life threatening emergency, suicidal or homicidal thoughts you must seek medical attention immediately by calling 911 or calling your MD immediately  if symptoms less severe.  You must read complete instructions/literature along with all the possible adverse reactions/side effects for all the Medicines you take and that have been prescribed to you. Take any new Medicines after you have completely understood and accpet all the possible adverse reactions/side effects.   Do not drive when taking Pain medications or sleeping medications (Benzodaizepines)  Do not take more than prescribed Pain, Sleep and Anxiety Medications. It is not advisable to combine anxiety,sleep and pain medications without talking with your primary care practitioner  Special Instructions: If you have smoked or chewed Tobacco  in the last 2 yrs please stop smoking, stop any regular Alcohol  and or any Recreational drug use.  Wear Seat belts while driving.  Please note: You were cared for by a hospitalist during your hospital stay. Once you are discharged, your primary care physician will handle any further medical issues. Please note that NO REFILLS for any discharge medications will be authorized once you are discharged, as it is imperative that you return to your primary care physician (or establish a relationship with a primary care physician if you do not have one) for your post hospital discharge needs so that they can reassess your need for medications and monitor your lab values.   Increase activity slowly    Complete by:  As directed      Allergies as of 01/18/2017      Reactions   Amoxicillin Hives, Itching, Rash   Has patient had a PCN reaction causing immediate rash, facial/tongue/throat swelling, SOB or  lightheadedness with hypotension: Unknown Has patient had a PCN reaction causing severe rash involving mucus membranes or skin necrosis: Yes  Has patient had a PCN reaction that required hospitalization: No Has patient had a PCN reaction occurring within the last 10 years: No  If all of the above answers are "NO", then may proceed with Cephalosporin use.   Macrodantin [nitrofurantoin Macrocrystal] Shortness Of Breath   Sulfa Antibiotics Rash      Medication List    STOP taking these medications   ciprofloxacin 250 MG tablet Commonly known as:  CIPRO   fluconazole 150 MG tablet Commonly known as:  DIFLUCAN     TAKE these medications   acetaminophen 500 MG tablet Commonly known as:  TYLENOL Take 500-1,000 mg by mouth every 4 (four) hours as needed for mild pain, moderate pain, fever or headache.   albuterol 108 (90 Base) MCG/ACT inhaler Commonly known as:  PROVENTIL HFA;VENTOLIN HFA Inhale 2 puffs into the lungs every 4 (four) hours as needed for wheezing or shortness of breath. What changed:  when to take this   aspirin EC 81 MG tablet Take 81 mg by mouth daily.   benzonatate 100 MG capsule Commonly known as:  TESSALON PERLES Take 2 capsules (200 mg total) by mouth 2 (two) times daily as needed for cough.  bimatoprost 0.01 % Soln Commonly known as:  LUMIGAN Place 1 drop into both eyes at bedtime.   CALTRATE PLUS PO Take 2 tablets by mouth 2 (two) times daily.   cetirizine 10 MG tablet Commonly known as:  ZYRTEC Take 10 mg by mouth daily.   cyclobenzaprine 10 MG tablet Commonly known as:  FLEXERIL Take 0.5 tablets (5 mg total) by mouth 3 (three) times daily as needed for muscle spasms.   esomeprazole 40 MG capsule Commonly known as:  NEXIUM Take 40 mg by mouth daily before breakfast.   fish oil-omega-3 fatty acids 1000 MG capsule Take 2 g by mouth 2 (two) times daily.   fluticasone 50 MCG/ACT nasal spray Commonly known as:  FLONASE Place 2 sprays into the  nose at bedtime.   Fluticasone-Salmeterol 100-50 MCG/DOSE Aepb Commonly known as:  ADVAIR Inhale 1 puff into the lungs 2 (two) times daily.   guaiFENesin 600 MG 12 hr tablet Commonly known as:  MUCINEX Take 1 tablet (600 mg total) by mouth 2 (two) times daily.   irbesartan 150 MG tablet Commonly known as:  AVAPRO Take 150 mg by mouth daily.   niacin 500 MG tablet Take 500 mg by mouth daily with breakfast.   ondansetron 4 MG tablet Commonly known as:  ZOFRAN Take 1 tablet (4 mg total) by mouth every 8 (eight) hours as needed for nausea or vomiting.   oseltamivir 30 MG capsule Commonly known as:  TAMIFLU Take 1 capsule (30 mg total) by mouth 2 (two) times daily.   Potassium 99 MG Tabs Take 99 mg by mouth daily.   predniSONE 10 MG tablet Commonly known as:  DELTASONE Take 4 tablets (40 mg) daily for 2 days, then, Take 3 tablets (30 mg) daily for 2 days, then, Take 2 tablets (20 mg) daily for 2 days, then, Take 1 tablets (10 mg) daily for 1 days, then stop   sodium chloride 0.65 % Soln nasal spray Commonly known as:  OCEAN Place 1 spray into both nostrils as needed for congestion.   sucralfate 1 GM/10ML suspension Commonly known as:  CARAFATE Take 10 mLs (1 g total) by mouth 4 (four) times daily -  with meals and at bedtime.   traMADol 50 MG tablet Commonly known as:  ULTRAM Take 50 mg by mouth every 6 (six) hours as needed for moderate pain or severe pain.   VITAMIN B-12 PO Place 1 tablet under the tongue daily.   vitamin C 500 MG tablet Commonly known as:  ASCORBIC ACID Take 500 mg by mouth daily.   vitamin E 400 UNIT capsule Take 400 Units by mouth daily.            Durable Medical Equipment        Start     Ordered   01/18/17 0830  For home use only DME Walker rolling  Once    Question:  Patient needs a walker to treat with the following condition  Answer:  Acute bronchitis   01/18/17 0830     Follow-up Information    Marton Redwood, MD. Schedule  an appointment as soon as possible for a visit in 1 week(s).   Specialty:  Internal Medicine Contact information: [place] [address] [phone]          Allergies  Allergen Reactions  . Amoxicillin Hives, Itching and Rash    Has patient had a PCN reaction causing immediate rash, facial/tongue/throat swelling, SOB or lightheadedness with hypotension: Unknown Has  patient had a PCN reaction causing severe rash involving mucus membranes or skin necrosis: Yes  Has patient had a PCN reaction that required hospitalization: No Has patient had a PCN reaction occurring within the last 10 years: No  If all of the above answers are "NO", then may proceed with Cephalosporin use.   Clancy Gourd [Nitrofurantoin Macrocrystal] Shortness Of Breath  . Sulfa Antibiotics Rash      Consultations:  None   Other Procedures/Studies: Dg Chest 2 View  Result Date: 01/16/2017 CLINICAL DATA:  83 y/o  F; sore throat, cough, and nasal congestion. EXAM: CHEST  2 VIEW COMPARISON:  04/09/2015 chest radiograph FINDINGS: Stable heart size and mediastinal contours are within normal limits. Aortic atherosclerosis with calcifications. Surgical clips in right upper quadrant, likely cholecystectomy. Partially visualized lumbar fusion hardware. Both lungs are clear. IMPRESSION: No active cardiopulmonary disease. Electronically Signed   By: Kristine Garbe M.D.   On: 01/16/2017 06:22   US Abdomen Limited  Result Date: 01/12/2017 CLINICAL DATA:  Abdominal pain and nausea for 1 week. EXAM: US ABDOMEN LIMITED - RIGHT UPPER QUADRANT COMPARISON:  09/26/2014 CT abdomen FINDINGS: Gallbladder: Surgically absent Common bile duct: Diameter: 5 mm Liver: No focal lesion identified. Within normal limits in parenchymal echogenicity. IMPRESSION: 1. No significant abnormality of the liver or visualized biliary tree identified. Gallbladder surgically absent. Electronically Signed   By: Van Clines  M.D.   On: 01/12/2017 11:16   Xr Cervical Spine 2 Or 3 Views  Result Date: 01/11/2017 An AP and lateral cervical spine does show some degenerative changes in the cervical spine and some loss of her cervical lordosis but otherwise no acute changes or malalignment issues.  Xr Shoulder Left  Result Date: 01/11/2017 3 views of her left shoulder AP, outlet, axillary show no acute changes. The shoulders actually very well located in a good position. There is no evidence of rotator cuff arthropathy. Some mild before meals joint arthritis.     TODAY-DAY OF DISCHARGE:  Subjective:   Ameilia Hirakawa today has no headache,no chest abdominal pain,no new weakness tingling or numbness, feels much better wants to go home today.   Objective:   Blood pressure 127/62, pulse 66, temperature 98.4 F (36.9 C), temperature source Oral, resp. rate 16, height 5\' 5"  (1.651 m), weight 83.9 kg (185 lb), SpO2 98 %.  Intake/Output Summary (Last 24 hours) at 01/18/17 1058 Last data filed at 01/18/17 0441  Gross per 24 hour  Intake              120 ml  Output             2150 ml  Net            -2030 ml   Filed Weights   01/16/17 0539  Weight: 83.9 kg (185 lb)    Exam: Awake Alert, Oriented *3, No new F.N deficits, Normal affect [place].AT,PERRAL Supple Neck,No JVD, No cervical lymphadenopathy appriciated.  Symmetrical Chest wall movement, Good air movement bilaterally, CTAB RRR,No Gallops,Rubs or new Murmurs, No Parasternal Heave +ve B.Sounds, Abd Soft, Non tender, No organomegaly appriciated, No rebound -guarding or rigidity. No Cyanosis, Clubbing or edema, No new Rash or bruise   PERTINENT RADIOLOGIC STUDIES: Dg Chest 2 View  Result Date: 01/16/2017 CLINICAL DATA:  83 y/o  F; sore throat, cough, and nasal congestion. EXAM: CHEST  2 VIEW COMPARISON:  04/09/2015 chest radiograph FINDINGS: Stable heart size and mediastinal contours are within normal limits. Aortic atherosclerosis with calcifications.  Surgical  clips in right upper quadrant, likely cholecystectomy. Partially visualized lumbar fusion hardware. Both lungs are clear. IMPRESSION: No active cardiopulmonary disease. Electronically Signed   By: Kristine Garbe M.D.   On: 01/16/2017 06:22   US Abdomen Limited  Result Date: 01/12/2017 CLINICAL DATA:  Abdominal pain and nausea for 1 week. EXAM: US ABDOMEN LIMITED - RIGHT UPPER QUADRANT COMPARISON:  09/26/2014 CT abdomen FINDINGS: Gallbladder: Surgically absent Common bile duct: Diameter: 5 mm Liver: No focal lesion identified. Within normal limits in parenchymal echogenicity. IMPRESSION: 1. No significant abnormality of the liver or visualized biliary tree identified. Gallbladder surgically absent. Electronically Signed   By: Van Clines M.D.   On: 01/12/2017 11:16   Xr Cervical Spine 2 Or 3 Views  Result Date: 01/11/2017 An AP and lateral cervical spine does show some degenerative changes in the cervical spine and some loss of her cervical lordosis but otherwise no acute changes or malalignment issues.  Xr Shoulder Left  Result Date: 01/11/2017 3 views of her left shoulder AP, outlet, axillary show no acute changes. The shoulders actually very well located in a good position. There is no evidence of rotator cuff arthropathy. Some mild before meals joint arthritis.    PERTINENT LAB RESULTS: CBC:  Recent Labs  01/17/17 0616 01/18/17 0521  WBC 4.3 5.2  HGB 7.9* 9.2*  HCT 23.9* 27.9*  PLT 229 251   CMET CMP     Component Value Date/Time   NA 130 (L) 01/18/2017 0521   K 4.6 01/18/2017 0521   CL 97 (L) 01/18/2017 0521   CO2 23 01/18/2017 0521   GLUCOSE 131 (H) 01/18/2017 0521   BUN 20 01/18/2017 0521   CREATININE 0.93 01/18/2017 0521   CALCIUM 9.3 01/18/2017 0521   PROT 6.0 (L) 01/17/2017 0616   ALBUMIN 3.1 (L) 01/17/2017 0616   AST 25 01/17/2017 0616   ALT 15 01/17/2017 0616   ALKPHOS 43 01/17/2017 0616   BILITOT 0.3 01/17/2017 0616   GFRNONAA 53 (L)  01/18/2017 0521   GFRAA >60 01/18/2017 0521    GFR Estimated Creatinine Clearance: 43.9 mL/min (by C-G formula based on SCr of 0.93 mg/dL). No results for input(s): LIPASE, AMYLASE in the last 72 hours. No results for input(s): CKTOTAL, CKMB, CKMBINDEX, TROPONINI in the last 72 hours. Invalid input(s): POCBNP No results for input(s): DDIMER in the last 72 hours. No results for input(s): HGBA1C in the last 72 hours. No results for input(s): CHOL, HDL, LDLCALC, TRIG, CHOLHDL, LDLDIRECT in the last 72 hours.  Recent Labs  01/16/17 0818  TSH 1.450    Recent Labs  01/16/17 0818  VITAMINB12 2,825*  FOLATE 8.9  FERRITIN 70  TIBC 307  IRON 14*  RETICCTPCT 1.8   Coags: No results for input(s): INR in the last 72 hours.  Invalid input(s): PT Microbiology: No results found for this or any previous visit (from the past 240 hour(s)).  FURTHER DISCHARGE INSTRUCTIONS:  Get Medicines reviewed and adjusted: Please take all your medications with you for your next visit with your Primary MD  Laboratory/radiological data: Please request your Primary MD to go over all hospital tests and procedure/radiological results at the follow up, please ask your Primary MD to get all Hospital records sent to his/her office.  In some cases, they will be blood work, cultures and biopsy results pending at the time of your discharge. Please request that your primary care M.D. goes through all the records of your hospital data and follows up on these results.  Also Note the following: If you experience worsening of your admission symptoms, develop shortness of breath, life threatening emergency, suicidal or homicidal thoughts you must seek medical attention immediately by calling 911 or calling your MD immediately  if symptoms less severe.  You must read complete instructions/literature along with all the possible adverse reactions/side effects for all the Medicines you take and that have been prescribed  to you. Take any new Medicines after you have completely understood and accpet all the possible adverse reactions/side effects.   Do not drive when taking Pain medications or sleeping medications (Benzodaizepines)  Do not take more than prescribed Pain, Sleep and Anxiety Medications. It is not advisable to combine anxiety,sleep and pain medications without talking with your primary care practitioner  Special Instructions: If you have smoked or chewed Tobacco  in the last 2 yrs please stop smoking, stop any regular Alcohol  and or any Recreational drug use.  Wear Seat belts while driving.  Please note: You were cared for by a hospitalist during your hospital stay. Once you are discharged, your primary care physician will handle any further medical issues. Please note that NO REFILLS for any discharge medications will be authorized once you are discharged, as it is imperative that you return to your primary care physician (or establish a relationship with a primary care physician if you do not have one) for your post hospital discharge needs so that they can reassess your need for medications and monitor your lab values.  Total Time spent coordinating discharge including counseling, education and face to face time equals 45 minutes.  SignedOren Binet 01/18/2017 10:58 AM

## 2017-01-18 NOTE — Consult Note (Signed)
           [facility] CM Primary Care Navigator  01/18/2017  TWYLLA ZIEGENFUSS [birth year] [id number]    Went to see patient at the bedside to identify possible discharge needs but she was already discharged home with home health services today per staff report.  Primary care provider's office called [facility]) to notify of patient's discharge and need for post hospital follow-up and transition of care.  Made aware to refer patient to [facility] care management if deemed appropriate for services.  For questions, please contact:  Dannielle Huh, BSN, RN- [facility] Primary Care Navigator  Telephone: [phone] [place]

## 2017-01-19 DIAGNOSIS — E669 Obesity, unspecified: Secondary | ICD-10-CM | POA: Diagnosis not present

## 2017-01-19 DIAGNOSIS — S3210XD Unspecified fracture of sacrum, subsequent encounter for fracture with routine healing: Secondary | ICD-10-CM | POA: Diagnosis not present

## 2017-01-19 DIAGNOSIS — W19XXXD Unspecified fall, subsequent encounter: Secondary | ICD-10-CM | POA: Diagnosis not present

## 2017-01-19 DIAGNOSIS — J329 Chronic sinusitis, unspecified: Secondary | ICD-10-CM | POA: Diagnosis not present

## 2017-01-19 DIAGNOSIS — M545 Low back pain: Secondary | ICD-10-CM | POA: Diagnosis not present

## 2017-01-19 DIAGNOSIS — J45909 Unspecified asthma, uncomplicated: Secondary | ICD-10-CM | POA: Diagnosis not present

## 2017-01-24 DIAGNOSIS — S3210XD Unspecified fracture of sacrum, subsequent encounter for fracture with routine healing: Secondary | ICD-10-CM | POA: Diagnosis not present

## 2017-01-24 DIAGNOSIS — W19XXXD Unspecified fall, subsequent encounter: Secondary | ICD-10-CM | POA: Diagnosis not present

## 2017-01-24 DIAGNOSIS — E669 Obesity, unspecified: Secondary | ICD-10-CM | POA: Diagnosis not present

## 2017-01-24 DIAGNOSIS — J329 Chronic sinusitis, unspecified: Secondary | ICD-10-CM | POA: Diagnosis not present

## 2017-01-24 DIAGNOSIS — M545 Low back pain: Secondary | ICD-10-CM | POA: Diagnosis not present

## 2017-01-24 DIAGNOSIS — J45909 Unspecified asthma, uncomplicated: Secondary | ICD-10-CM | POA: Diagnosis not present

## 2017-01-26 DIAGNOSIS — E871 Hypo-osmolality and hyponatremia: Secondary | ICD-10-CM | POA: Diagnosis not present

## 2017-01-26 DIAGNOSIS — S3210XD Unspecified fracture of sacrum, subsequent encounter for fracture with routine healing: Secondary | ICD-10-CM | POA: Diagnosis not present

## 2017-01-26 DIAGNOSIS — W19XXXD Unspecified fall, subsequent encounter: Secondary | ICD-10-CM | POA: Diagnosis not present

## 2017-01-26 DIAGNOSIS — M545 Low back pain: Secondary | ICD-10-CM | POA: Diagnosis not present

## 2017-01-26 DIAGNOSIS — J45909 Unspecified asthma, uncomplicated: Secondary | ICD-10-CM | POA: Diagnosis not present

## 2017-01-26 DIAGNOSIS — E669 Obesity, unspecified: Secondary | ICD-10-CM | POA: Diagnosis not present

## 2017-01-26 DIAGNOSIS — J329 Chronic sinusitis, unspecified: Secondary | ICD-10-CM | POA: Diagnosis not present

## 2017-01-30 DIAGNOSIS — E669 Obesity, unspecified: Secondary | ICD-10-CM | POA: Diagnosis not present

## 2017-01-30 DIAGNOSIS — J45909 Unspecified asthma, uncomplicated: Secondary | ICD-10-CM | POA: Diagnosis not present

## 2017-01-30 DIAGNOSIS — M545 Low back pain: Secondary | ICD-10-CM | POA: Diagnosis not present

## 2017-01-30 DIAGNOSIS — W19XXXD Unspecified fall, subsequent encounter: Secondary | ICD-10-CM | POA: Diagnosis not present

## 2017-01-30 DIAGNOSIS — J329 Chronic sinusitis, unspecified: Secondary | ICD-10-CM | POA: Diagnosis not present

## 2017-01-30 DIAGNOSIS — S3210XD Unspecified fracture of sacrum, subsequent encounter for fracture with routine healing: Secondary | ICD-10-CM | POA: Diagnosis not present

## 2017-02-01 DIAGNOSIS — J45909 Unspecified asthma, uncomplicated: Secondary | ICD-10-CM | POA: Diagnosis not present

## 2017-02-01 DIAGNOSIS — J329 Chronic sinusitis, unspecified: Secondary | ICD-10-CM | POA: Diagnosis not present

## 2017-02-01 DIAGNOSIS — W19XXXD Unspecified fall, subsequent encounter: Secondary | ICD-10-CM | POA: Diagnosis not present

## 2017-02-01 DIAGNOSIS — E669 Obesity, unspecified: Secondary | ICD-10-CM | POA: Diagnosis not present

## 2017-02-01 DIAGNOSIS — S3210XD Unspecified fracture of sacrum, subsequent encounter for fracture with routine healing: Secondary | ICD-10-CM | POA: Diagnosis not present

## 2017-02-01 DIAGNOSIS — M545 Low back pain: Secondary | ICD-10-CM | POA: Diagnosis not present

## 2017-02-02 DIAGNOSIS — W19XXXD Unspecified fall, subsequent encounter: Secondary | ICD-10-CM | POA: Diagnosis not present

## 2017-02-02 DIAGNOSIS — J45909 Unspecified asthma, uncomplicated: Secondary | ICD-10-CM | POA: Diagnosis not present

## 2017-02-02 DIAGNOSIS — S3210XD Unspecified fracture of sacrum, subsequent encounter for fracture with routine healing: Secondary | ICD-10-CM | POA: Diagnosis not present

## 2017-02-02 DIAGNOSIS — M545 Low back pain: Secondary | ICD-10-CM | POA: Diagnosis not present

## 2017-02-02 DIAGNOSIS — E669 Obesity, unspecified: Secondary | ICD-10-CM | POA: Diagnosis not present

## 2017-02-02 DIAGNOSIS — J329 Chronic sinusitis, unspecified: Secondary | ICD-10-CM | POA: Diagnosis not present

## 2017-02-03 DIAGNOSIS — E871 Hypo-osmolality and hyponatremia: Secondary | ICD-10-CM | POA: Diagnosis not present

## 2017-02-03 DIAGNOSIS — S3210XD Unspecified fracture of sacrum, subsequent encounter for fracture with routine healing: Secondary | ICD-10-CM | POA: Diagnosis not present

## 2017-02-03 DIAGNOSIS — J45909 Unspecified asthma, uncomplicated: Secondary | ICD-10-CM | POA: Diagnosis not present

## 2017-02-03 DIAGNOSIS — D649 Anemia, unspecified: Secondary | ICD-10-CM | POA: Diagnosis not present

## 2017-02-03 DIAGNOSIS — E669 Obesity, unspecified: Secondary | ICD-10-CM | POA: Diagnosis not present

## 2017-02-03 DIAGNOSIS — W19XXXD Unspecified fall, subsequent encounter: Secondary | ICD-10-CM | POA: Diagnosis not present

## 2017-02-03 DIAGNOSIS — M545 Low back pain: Secondary | ICD-10-CM | POA: Diagnosis not present

## 2017-02-03 DIAGNOSIS — J329 Chronic sinusitis, unspecified: Secondary | ICD-10-CM | POA: Diagnosis not present

## 2017-02-06 DIAGNOSIS — W19XXXD Unspecified fall, subsequent encounter: Secondary | ICD-10-CM | POA: Diagnosis not present

## 2017-02-06 DIAGNOSIS — J45909 Unspecified asthma, uncomplicated: Secondary | ICD-10-CM | POA: Diagnosis not present

## 2017-02-06 DIAGNOSIS — J329 Chronic sinusitis, unspecified: Secondary | ICD-10-CM | POA: Diagnosis not present

## 2017-02-06 DIAGNOSIS — M545 Low back pain: Secondary | ICD-10-CM | POA: Diagnosis not present

## 2017-02-06 DIAGNOSIS — S3210XD Unspecified fracture of sacrum, subsequent encounter for fracture with routine healing: Secondary | ICD-10-CM | POA: Diagnosis not present

## 2017-02-06 DIAGNOSIS — E669 Obesity, unspecified: Secondary | ICD-10-CM | POA: Diagnosis not present

## 2017-02-07 DIAGNOSIS — H9212 Otorrhea, left ear: Secondary | ICD-10-CM | POA: Diagnosis not present

## 2017-02-07 DIAGNOSIS — J3489 Other specified disorders of nose and nasal sinuses: Secondary | ICD-10-CM | POA: Diagnosis not present

## 2017-02-08 DIAGNOSIS — W19XXXD Unspecified fall, subsequent encounter: Secondary | ICD-10-CM | POA: Diagnosis not present

## 2017-02-08 DIAGNOSIS — J329 Chronic sinusitis, unspecified: Secondary | ICD-10-CM | POA: Diagnosis not present

## 2017-02-08 DIAGNOSIS — M545 Low back pain: Secondary | ICD-10-CM | POA: Diagnosis not present

## 2017-02-08 DIAGNOSIS — E669 Obesity, unspecified: Secondary | ICD-10-CM | POA: Diagnosis not present

## 2017-02-08 DIAGNOSIS — S3210XD Unspecified fracture of sacrum, subsequent encounter for fracture with routine healing: Secondary | ICD-10-CM | POA: Diagnosis not present

## 2017-02-08 DIAGNOSIS — H9212 Otorrhea, left ear: Secondary | ICD-10-CM | POA: Diagnosis not present

## 2017-02-08 DIAGNOSIS — J45909 Unspecified asthma, uncomplicated: Secondary | ICD-10-CM | POA: Diagnosis not present

## 2017-02-13 DIAGNOSIS — W19XXXD Unspecified fall, subsequent encounter: Secondary | ICD-10-CM | POA: Diagnosis not present

## 2017-02-13 DIAGNOSIS — Z6831 Body mass index (BMI) 31.0-31.9, adult: Secondary | ICD-10-CM | POA: Diagnosis not present

## 2017-02-13 DIAGNOSIS — M545 Low back pain: Secondary | ICD-10-CM | POA: Diagnosis not present

## 2017-02-13 DIAGNOSIS — J45909 Unspecified asthma, uncomplicated: Secondary | ICD-10-CM | POA: Diagnosis not present

## 2017-02-13 DIAGNOSIS — J09X2 Influenza due to identified novel influenza A virus with other respiratory manifestations: Secondary | ICD-10-CM | POA: Diagnosis not present

## 2017-02-13 DIAGNOSIS — I10 Essential (primary) hypertension: Secondary | ICD-10-CM | POA: Diagnosis not present

## 2017-02-13 DIAGNOSIS — E871 Hypo-osmolality and hyponatremia: Secondary | ICD-10-CM | POA: Diagnosis not present

## 2017-02-13 DIAGNOSIS — J329 Chronic sinusitis, unspecified: Secondary | ICD-10-CM | POA: Diagnosis not present

## 2017-02-13 DIAGNOSIS — S3210XD Unspecified fracture of sacrum, subsequent encounter for fracture with routine healing: Secondary | ICD-10-CM | POA: Diagnosis not present

## 2017-02-13 DIAGNOSIS — D649 Anemia, unspecified: Secondary | ICD-10-CM | POA: Diagnosis not present

## 2017-02-13 DIAGNOSIS — E669 Obesity, unspecified: Secondary | ICD-10-CM | POA: Diagnosis not present

## 2017-02-15 DIAGNOSIS — S3210XD Unspecified fracture of sacrum, subsequent encounter for fracture with routine healing: Secondary | ICD-10-CM | POA: Diagnosis not present

## 2017-02-15 DIAGNOSIS — E669 Obesity, unspecified: Secondary | ICD-10-CM | POA: Diagnosis not present

## 2017-02-15 DIAGNOSIS — J329 Chronic sinusitis, unspecified: Secondary | ICD-10-CM | POA: Diagnosis not present

## 2017-02-15 DIAGNOSIS — J45909 Unspecified asthma, uncomplicated: Secondary | ICD-10-CM | POA: Diagnosis not present

## 2017-02-15 DIAGNOSIS — W19XXXD Unspecified fall, subsequent encounter: Secondary | ICD-10-CM | POA: Diagnosis not present

## 2017-02-15 DIAGNOSIS — M545 Low back pain: Secondary | ICD-10-CM | POA: Diagnosis not present

## 2017-02-16 DIAGNOSIS — M545 Low back pain: Secondary | ICD-10-CM | POA: Diagnosis not present

## 2017-02-16 DIAGNOSIS — S3210XD Unspecified fracture of sacrum, subsequent encounter for fracture with routine healing: Secondary | ICD-10-CM | POA: Diagnosis not present

## 2017-02-16 DIAGNOSIS — W19XXXD Unspecified fall, subsequent encounter: Secondary | ICD-10-CM | POA: Diagnosis not present

## 2017-02-16 DIAGNOSIS — J45909 Unspecified asthma, uncomplicated: Secondary | ICD-10-CM | POA: Diagnosis not present

## 2017-02-16 DIAGNOSIS — J329 Chronic sinusitis, unspecified: Secondary | ICD-10-CM | POA: Diagnosis not present

## 2017-02-16 DIAGNOSIS — E669 Obesity, unspecified: Secondary | ICD-10-CM | POA: Diagnosis not present

## 2017-02-22 ENCOUNTER — Ambulatory Visit (INDEPENDENT_AMBULATORY_CARE_PROVIDER_SITE_OTHER): Payer: Medicare Other | Admitting: Physician Assistant

## 2017-02-22 ENCOUNTER — Telehealth (INDEPENDENT_AMBULATORY_CARE_PROVIDER_SITE_OTHER): Payer: Self-pay | Admitting: Orthopaedic Surgery

## 2017-02-22 NOTE — Telephone Encounter (Signed)
Patient called advised she could not come to her appointment today because of the weather. Patient asked if she could be worked into Dr. Ninfa Linden or Gil's schedule before Friday so that her son can bring her to the appointment. Patient said her son will be leaving Saturday. The number to contact patient is [phone]

## 2017-02-22 NOTE — Telephone Encounter (Signed)
She can be worked in sometime next week, cause we don't have anything tomorrow. She didn't "need" to be seen this week

## 2017-02-23 NOTE — Telephone Encounter (Signed)
Patient scheduled 02/27/17 at 1:15 pm.

## 2017-02-27 ENCOUNTER — Encounter (INDEPENDENT_AMBULATORY_CARE_PROVIDER_SITE_OTHER): Payer: Self-pay | Admitting: Physician Assistant

## 2017-02-27 ENCOUNTER — Ambulatory Visit (INDEPENDENT_AMBULATORY_CARE_PROVIDER_SITE_OTHER): Payer: Medicare Other | Admitting: Physician Assistant

## 2017-02-27 ENCOUNTER — Ambulatory Visit (INDEPENDENT_AMBULATORY_CARE_PROVIDER_SITE_OTHER): Payer: Medicare Other

## 2017-02-27 ENCOUNTER — Telehealth (INDEPENDENT_AMBULATORY_CARE_PROVIDER_SITE_OTHER): Payer: Self-pay | Admitting: *Deleted

## 2017-02-27 DIAGNOSIS — M5442 Lumbago with sciatica, left side: Secondary | ICD-10-CM | POA: Diagnosis not present

## 2017-02-27 DIAGNOSIS — G8929 Other chronic pain: Secondary | ICD-10-CM | POA: Diagnosis not present

## 2017-02-27 DIAGNOSIS — M7062 Trochanteric bursitis, left hip: Secondary | ICD-10-CM

## 2017-02-27 DIAGNOSIS — M5441 Lumbago with sciatica, right side: Secondary | ICD-10-CM

## 2017-02-27 NOTE — Telephone Encounter (Signed)
Please advise 

## 2017-02-27 NOTE — Telephone Encounter (Signed)
Patient called in this afternoon in regards to physical therapy. She was seen today by Benita Stabile and he referred her to somewhere but it not convenient to where she lives. She would like to be referred to Rehabilitation Physical therapy on [facility] if at all possible please. Her CB # [phone]. Thank you

## 2017-02-27 NOTE — Progress Notes (Signed)
Office Visit Note   Patient: Beverly Jimenez           Date of Birth: [birth year]           MRN: [id number] Visit Date: 02/27/2017              Requested by: Marton Redwood, MD [address] [place], [place] [address] PCP: Marton Redwood, MD   Assessment & Plan: Visit Diagnoses:  1. Chronic bilateral low back pain with bilateral sciatica   2. Trochanteric bursitis of left hip     Plan: We'll send her to physical therapy for core strengthening, stretching stretching, IT band stretching, and modalities. Her back in 1 month check her progress lack of.  Follow-Up Instructions: Return in about 4 weeks (around 03/27/2017).   Orders:  Orders Placed This Encounter  Procedures  . XR Lumbar Spine 2-3 Views  . XR Pelvis 1-2 Views   No orders of the defined types were placed in this encounter.     Procedures: No procedures performed   Clinical Data: No additional findings.   Subjective: Chief Complaint  Patient presents with  . Lower Back - Pain  . Right Hip - Pain  . Left Hip - Pain    Patient presents with low back pain and bilateral hip and leg pain. She states that fell before Christmas. She has had increased pain x 3 weeks. She has soreness in both of her legs, sometimes left is greater than right, but she says it jumps from side to side. Patient does state that she had x-rays made by her PCP, and that they found an old fracture and she "freshened it up". She takes tramadol and tylenol for pain.  Regards to the fall she states she just fell backwards. There was no loss consciousness. She denies any dizziness or lightheadedness at the time. She does report since the fall that she saw Dr. Trenton Gammon who is status post spinal surgery on her in the past and was told that she may need some additional surgery she is not interested in this at this point in time. She's been given epidural steroid injections in her back and states that these really did not help. She has some numbness down  the left leg into the foot. No radicular symptoms down the right leg. Worse in her lower back first thing in the morning. She soaks in Epsom salts water with a green alcohol this seems to help some. She also wears a girdle helps. She's having no awaking pain in regards to the back  Notes were obtained from Dr. Raul Del office which showed a showed lumbar spine films were actually perform and showed an age indeterminate fracture of the S5 segment of the sacrum. He also notes that she has had physical therapy since the fall, around Christmas and this this was at home and this did help with her back and lower leg pain.  Review of Systems See HPI  Objective: Vital Signs: There were no vitals taken for this visit.  Physical Exam  Constitutional: She is oriented to person, place, and time. She appears well-developed and well-nourished. No distress.  Cardiovascular: Intact distal pulses.   Pulmonary/Chest: Effort normal.  Neurological: She is alert and oriented to person, place, and time.    Ortho Exam Range of motion of bilateral hips without pain. Tenderness over the left trochanteric region only. Negative straight leg raise bilaterally. She is able to forward flex and that Coumadin touching her toes by an  inch or so. She has good extension without pain. 5 out of 5 strengths throughout the lower extremities against resistance. Specialty Comments:  No specialty comments available.  Imaging: Xr Lumbar Spine 2-3 Views  Result Date: 02/27/2017 Lumbar spine AP lateral views : No acute fracture . Status post lumbar fusion L3-L5 no failure of hardware. Grade I t anterior spondylolisthesis  L2 on L3 DDD L5-S1.  Xr Pelvis 1-2 Views  Result Date: 02/27/2017 AP pelvis: Bi-Lateral hips well located. Hip joints well preserved. No acute fractures noted.    PMFS History: Patient Active Problem List   Diagnosis Date Noted  . Influenza 01/17/2017  . Influenza A 01/16/2017  . HTN (hypertension)  01/16/2017  . Microcytic anemia 01/16/2017  . Acute hyponatremia 01/16/2017  . Asthma 01/16/2017  . UTI (urinary tract infection) 01/16/2017  . Anemia   . Respiratory distress   . Neck pain 01/11/2017  . Acute pain of left shoulder 01/11/2017  . Left leg pain 01/06/2015  . Varicose veins of both lower extremities with complications 16/96/7893  . HNP (herniated nucleus pulposus), lumbar 07/05/2013   Past Medical History:  Diagnosis Date  . Anemia   . Arthritis    shoulder and knee pain  . Asthma   . Asthma   . Depression    Hx; of situational depression  . GERD (gastroesophageal reflux disease)   . Glaucoma    Hx; of  . High cholesterol   . Myalgia   . Numbness and tingling    Hx: of B/LLE  . Osteoporosis   . Pneumonia    Hx: of "years ago"  . Shortness of breath     Family History  Problem Relation Age of Onset  . Varicose Veins Mother   . Diabetes Brother   . Heart disease Brother   . Hypertension Brother   . Varicose Veins Brother   . Hypertension Other   . Heart disease Other   . Glaucoma Other   . Diabetes Other   . Varicose Veins Sister   . Varicose Veins Daughter     Past Surgical History:  Procedure Laterality Date  . APPENDECTOMY    . BACK SURGERY    . CHOLECYSTECTOMY    . COLONOSCOPY     Hx: of  . EYE SURGERY     catarac  . 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 HERNIA REPAIR    . LUMBAR LAMINECTOMY/DECOMPRESSION MICRODISCECTOMY Left 07/05/2013   Procedure: LUMBAR LAMINECTOMY/DECOMPRESSION MICRODISCECTOMY 1 LEVEL Left Lumbar five-Sacral one;  Surgeon: Charlie Pitter, MD;  Location: [facility];  Service: Neurosurgery;  Laterality: Left;  Left Lumbar Five-Sacral One Microdiskectomy  . VASCULAR SURGERY     varicose vein surgery   Social History   Occupational History  . Not on file.   Social History Main Topics  . Smoking status: Never Smoker  . Smokeless tobacco: Never Used  . Alcohol use No  . Drug use: No  . Sexual activity: Not  on file

## 2017-02-28 NOTE — Telephone Encounter (Signed)
Faxed to [facility],

## 2017-02-28 NOTE — Telephone Encounter (Signed)
Please send her there for  PT

## 2017-03-02 ENCOUNTER — Other Ambulatory Visit (INDEPENDENT_AMBULATORY_CARE_PROVIDER_SITE_OTHER): Payer: Self-pay

## 2017-03-02 ENCOUNTER — Telehealth (INDEPENDENT_AMBULATORY_CARE_PROVIDER_SITE_OTHER): Payer: Self-pay | Admitting: *Deleted

## 2017-03-02 DIAGNOSIS — M7072 Other bursitis of hip, left hip: Secondary | ICD-10-CM

## 2017-03-02 DIAGNOSIS — M5442 Lumbago with sciatica, left side: Secondary | ICD-10-CM

## 2017-03-02 DIAGNOSIS — M5441 Lumbago with sciatica, right side: Secondary | ICD-10-CM

## 2017-03-02 NOTE — Telephone Encounter (Signed)
Sent another referral to PT

## 2017-03-02 NOTE — Telephone Encounter (Signed)
Pt stated she called [facility] [address] and they stated they haven't gotten a fax from [place]. Pt is calling [facility] requesting a call back

## 2017-03-08 ENCOUNTER — Ambulatory Visit: Payer: Medicare Other | Attending: Internal Medicine | Admitting: Physical Therapy

## 2017-03-08 DIAGNOSIS — G8929 Other chronic pain: Secondary | ICD-10-CM

## 2017-03-08 DIAGNOSIS — M25552 Pain in left hip: Secondary | ICD-10-CM | POA: Diagnosis not present

## 2017-03-08 DIAGNOSIS — R293 Abnormal posture: Secondary | ICD-10-CM

## 2017-03-08 DIAGNOSIS — M545 Low back pain: Secondary | ICD-10-CM | POA: Insufficient documentation

## 2017-03-08 DIAGNOSIS — R2689 Other abnormalities of gait and mobility: Secondary | ICD-10-CM | POA: Diagnosis not present

## 2017-03-08 DIAGNOSIS — M6281 Muscle weakness (generalized): Secondary | ICD-10-CM | POA: Diagnosis not present

## 2017-03-08 NOTE — Therapy (Signed)
Beverly Jimenez, [place], [address] Phone: [phone]   Fax:  [phone]  Physical Therapy Evaluation  Patient Details  Name: Beverly Jimenez MRN: [id number] Date of Birth: 1924-12-13 Referring Provider: Mcarthur Rossetti MD  Encounter Date: 03/08/2017      PT End of Session - 03/08/17 1436    Visit Number 1   Number of Visits 17   Date for PT Re-Evaluation 05/03/17   PT Start Time 1346   PT Stop Time 7322   PT Time Calculation (min) 51 min   Activity Tolerance Patient tolerated treatment well   Behavior During Therapy [facility] for tasks assessed/performed      Past Medical History:  Diagnosis Date  . Anemia   . Arthritis    shoulder and knee pain  . Asthma   . Asthma   . Depression    Hx; of situational depression  . GERD (gastroesophageal reflux disease)   . Glaucoma    Hx; of  . High cholesterol   . Myalgia   . Numbness and tingling    Hx: of B/LLE  . Osteoporosis   . Pneumonia    Hx: of "years ago"  . Shortness of breath     Past Surgical History:  Procedure Laterality Date  . APPENDECTOMY    . BACK SURGERY    . CHOLECYSTECTOMY    . COLONOSCOPY     Hx: of  . EYE SURGERY     catarac  . 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 HERNIA REPAIR    . LUMBAR LAMINECTOMY/DECOMPRESSION MICRODISCECTOMY Left 07/05/2013   Procedure: LUMBAR LAMINECTOMY/DECOMPRESSION MICRODISCECTOMY 1 LEVEL Left Lumbar five-Sacral one;  Surgeon: Charlie Pitter, MD;  Location: [facility];  Service: Neurosurgery;  Laterality: Left;  Left Lumbar Five-Sacral One Microdiskectomy  . VASCULAR SURGERY     varicose vein surgery    There were no vitals filed for this visit.       Subjective Assessment - 03/08/17 1348    Subjective pt is a 83 y.o with CC of low back and L hip pain that has been going since the last low back and hip pain following a fall backward on her butt that occurred in December of 2017.  Since the fall the pain has fluctuated. Pain in bil low back and hips. denies any N/T, hx of bowel / bladder incontenence that predated the fall.    Pertinent History mulitple low back surgeries last onebeing in 2015   Limitations Lifting;Standing;Walking;House hold activities;Sitting   How long can you sit comfortably? unlimited   How long can you stand comfortably? 20 min with SPC   How long can you walk comfortably? 5-10 min   Diagnostic tests MRI   Patient Stated Goals to get better, decrease pain, to walk/ standing   Currently in Pain? Yes   Pain Score 4    Pain Location Back   Pain Orientation Left;Right;Lower   Pain Descriptors / Indicators Sore   Pain Type Chronic pain   Pain Radiating Towards lateral aspect of bil hips   Pain Onset More than a month ago   Pain Frequency Constant   Aggravating Factors  getting out of the chair, prolonged standing/ walking   Pain Relieving Factors sitting down/ resting, laying down, heating pad, medication            [facility] PT Assessment - 03/08/17 1340      Assessment  Medical Diagnosis Low back pain, Bursitis of L hip   Referring Provider Mcarthur Rossetti MD   Onset Date/Surgical Date --  December 2017   Hand Dominance Right   Next MD Visit --  1 month   Prior Therapy yes     Precautions   Precautions None     Restrictions   Weight Bearing Restrictions No     Balance Screen   Has the patient fallen in the past 6 months Yes   How many times? 2   Has the patient had a decrease in activity level because of a fear of falling?  Yes   Is the patient reluctant to leave their home because of a fear of falling?  Yes     [place] residence   Living Arrangements Alone   Type of [place] Access Level entry   [place] Other (Comment)  4-5 steps to the pattern   [place] - 2 wheels;Cane - single point     Prior Function   Level of Independence  Independent;Independent with basic ADLs   Vocation Retired   Engineer, mining     Observation/Other Assessments   Focus on Therapeutic Outcomes (FOTO)  58% limited  predicted 49% limited     Posture/Postural Control   Posture/Postural Control Postural limitations   Postural Limitations Rounded Shoulders;Forward head;Decreased lumbar lordosis;Increased thoracic kyphosis     ROM / Strength   AROM / PROM / Strength AROM;Strength     AROM   Overall AROM Comments stays in forward flexion position   AROM Assessment Site Lumbar;Hip   Right/Left Hip Left;Right   Right Hip Extension 13   Right Hip Flexion 102   Left Hip Extension 6   Left Hip Flexion 100   Lumbar Flexion 80  pain coming back to neutral   Lumbar Extension 18   Lumbar - Right Side Bend 13   Lumbar - Left Side Bend 11     Strength   Strength Assessment Site Hip;Knee   Right/Left Hip Right;Left   Right Hip Flexion 4/5   Right Hip Extension 3/5   Right Hip ABduction 3+/5   Right Hip ADduction 4+/5   Left Hip Flexion 4/5   Left Hip Extension 3-/5  due to limited mobility   Left Hip ABduction 3+/5   Left Hip ADduction 4+/5   Right/Left Knee Right;Left   Right Knee Flexion 4+/5   Right Knee Extension 4+/5   Left Knee Flexion 4+/5   Left Knee Extension 4+/5     Palpation   Palpation comment bil greater trochanger soreness, L glute med/ min tightness with pain, L piriformis pain      Special Tests    Special Tests Lumbar   Lumbar Tests Slump Test;Prone Knee Bend Test;Straight Leg Raise     Ambulation/Gait   Ambulation/Gait Yes   Assistive device Straight cane   Gait Pattern Step-through pattern;Decreased stride length;Trendelenburg;Antalgic;Trunk flexed                   [facility] Adult PT Treatment/Exercise - 03/08/17 1340      Lumbar Exercises: Stretches   Lower Trunk Rotation --  2 x 10    Lower Trunk Rotation Limitations verbal cues for form and avoiding painful motions      Lumbar Exercises: Supine   Bent Knee Raise 10 reps   Bent Knee Raise Limitations multple verbal/ tactile cues for form and  ADIM     Knee/Hip Exercises: Supine   Bridges Strengthening;1 set;5 reps   Bridges Limitations verbal cues to squeeze butt at the top  able to peform small bridge due to weakness     Knee/Hip Exercises: Sidelying   Hip ABduction Strengthening;Left;2 sets;10 reps   Hip ABduction Limitations verbal cues to avoid rolling hips back                PT Education - 03/08/17 1340    Education provided Yes   Education Details evaluation findings, POC, goals, HEP with proper form. anatomy of areas involved   Person(s) Educated Patient   Methods Explanation;Verbal cues;Handout   Comprehension Verbalized understanding;Verbal cues required          PT Short Term Goals - 03/08/17 1442      PT SHORT TERM GOAL #1   Title pt will be I with inital HEP (03/29/2017)   Time 4   Period Weeks   Status New     PT SHORT TERM GOAL #2   Title pt will be able to verbalize and demo techniques to prevent and reduce L hip pain via RICE and HEP (03/29/2017)   Time 4   Period Weeks   Status New     PT SHORT TERM GOAL #3   Title she will increase L hip flexion / extension by >/= 5 degree to promote functional mobility with </= 4/10 pain (03/29/2017)   Time 4   Period Weeks   Status New           PT Long Term Goals - 03/08/17 1444      PT LONG TERM GOAL #1   Title pt will be I with all HEP given as of last visit (05/03/2017)   Time 8   Period Weeks   Status New     PT LONG TERM GOAL #2   Title she will increase L hip abduction/ extension to >/= 4/5 to promote safety during standing and walking (05/03/2017)   Time 8   Period Weeks   Status New     PT LONG TERM GOAL #3   Title she will be able to stand / walk >/= 20 min with LRAD with </= 2/10 for endurance required for  community and home ambulation (05/03/2017)   Time 8   Period Weeks   Status New      PT LONG TERM GOAL #4   Title she will increase her FOTO score to </= 495 limited to demonstrate improvement in function.    Time 8   Period Weeks   Status New               Plan - 03/08/17 1437    Clinical Impression Statement Beverly Jimenez presents to OPPT as a moderate complexity evaluation base don involved PMHx, continued fluctuating pain and evaluation findings with CC of bil low back and L hip pain. she exhibits functional trunk and hip mobility with pain noted returning to standing from flexed position. functional strength but limited endurance as pt fatigued walking to exam room. tightness with pain in L glute med/ min and piriformis with bil greater trochanger pain. She would benefit from physical therapy to reduce muscle tightness and pain, promote gait efficiency and increase endurance to maximze safety and function by addressing the deficits listed.    Rehab Potential Good   PT Frequency 2x / week   PT Duration 8 weeks   PT Treatment/Interventions ADLs/Self Care Home Management;Cryotherapy;Electrical Stimulation;Iontophoresis  4mg /ml Dexamethasone;Moist Heat;Ultrasound;Gait training;Stair training;Dry needling;Taping;Manual techniques;Passive range of motion;Patient/family education;Therapeutic activities;Therapeutic exercise;Balance training   PT Next Visit Plan assess/ review HEP, STM over L glute med/ min region, strength, Assess BERG, modalities PRN   PT Home Exercise Plan supine marching, glute med/ piriformis stretch, sideling abduction, bridges   Consulted and Agree with Plan of Care Patient      Patient will benefit from skilled therapeutic intervention in order to improve the following deficits and impairments:  Abnormal gait, Pain, Improper body mechanics, Postural dysfunction, Decreased strength, Decreased endurance, Decreased activity tolerance, Increased fascial restricitons, Increased muscle spasms, Decreased balance  Visit Diagnosis: Pain in left hip - Plan: PT  plan of care cert/re-cert  Chronic bilateral low back pain, with sciatica presence unspecified - Plan: PT plan of care cert/re-cert  Muscle weakness (generalized) - Plan: PT plan of care cert/re-cert  Other abnormalities of gait and mobility - Plan: PT plan of care cert/re-cert  Abnormal posture - Plan: PT plan of care cert/re-cert      G-Codes - 35/70/17 1453    Functional Assessment Tool Used (Outpatient Only) FOTO/ clinical judgement   Functional Limitation Mobility: Walking and moving around   Mobility: Walking and Moving Around Current Status (B9390) At least 40 percent but less than 60 percent impaired, limited or restricted   Mobility: Walking and Moving Around Goal Status (Z0092) At least 20 percent but less than 40 percent impaired, limited or restricted       Problem List Patient Active Problem List   Diagnosis Date Noted  . Influenza 01/17/2017  . Influenza A 01/16/2017  . HTN (hypertension) 01/16/2017  . Microcytic anemia 01/16/2017  . Acute hyponatremia 01/16/2017  . Asthma 01/16/2017  . UTI (urinary tract infection) 01/16/2017  . Anemia   . Respiratory distress   . Neck pain 01/11/2017  . Acute pain of left shoulder 01/11/2017  . Left leg pain 01/06/2015  . Varicose veins of both lower extremities with complications 33/00/7622  . HNP (herniated nucleus pulposus), lumbar 07/05/2013   Beverly Jimenez PT, DPT, LAT, ATC  03/08/17  2:55 PM      [facility] [address] [place], [place], [address] Phone: [phone]   Fax:  [phone]  Name: Beverly Jimenez MRN: [id number] Date of Birth: 1924-12-23

## 2017-03-13 DIAGNOSIS — L299 Pruritus, unspecified: Secondary | ICD-10-CM | POA: Diagnosis not present

## 2017-03-13 DIAGNOSIS — H9212 Otorrhea, left ear: Secondary | ICD-10-CM | POA: Diagnosis not present

## 2017-03-14 ENCOUNTER — Encounter: Payer: Self-pay | Admitting: Physical Therapy

## 2017-03-14 ENCOUNTER — Ambulatory Visit: Payer: Medicare Other | Admitting: Physical Therapy

## 2017-03-14 DIAGNOSIS — R2689 Other abnormalities of gait and mobility: Secondary | ICD-10-CM | POA: Diagnosis not present

## 2017-03-14 DIAGNOSIS — M25552 Pain in left hip: Secondary | ICD-10-CM

## 2017-03-14 DIAGNOSIS — M545 Low back pain: Secondary | ICD-10-CM

## 2017-03-14 DIAGNOSIS — M6281 Muscle weakness (generalized): Secondary | ICD-10-CM | POA: Diagnosis not present

## 2017-03-14 DIAGNOSIS — R293 Abnormal posture: Secondary | ICD-10-CM

## 2017-03-14 DIAGNOSIS — G8929 Other chronic pain: Secondary | ICD-10-CM

## 2017-03-14 NOTE — Therapy (Signed)
[place], [place], [address] Phone: [phone]   Fax:  [phone]  Physical Therapy Treatment  Patient Details  Name: Beverly Jimenez MRN: [id number] Date of Birth: 06-22-1925 Referring Provider: Mcarthur Rossetti MD  Encounter Date: 03/14/2017      PT End of Session - 03/14/17 1545    Visit Number 2   Number of Visits 17   Date for PT Re-Evaluation 05/03/17   PT Start Time 1503   PT Stop Time 1600   PT Time Calculation (min) 57 min   Activity Tolerance Patient tolerated treatment well   Behavior During Therapy [facility] for tasks assessed/performed      Past Medical History:  Diagnosis Date  . Anemia   . Arthritis    shoulder and knee pain  . Asthma   . Asthma   . Depression    Hx; of situational depression  . GERD (gastroesophageal reflux disease)   . Glaucoma    Hx; of  . High cholesterol   . Myalgia   . Numbness and tingling    Hx: of B/LLE  . Osteoporosis   . Pneumonia    Hx: of "years ago"  . Shortness of breath     Past Surgical History:  Procedure Laterality Date  . APPENDECTOMY    . BACK SURGERY    . CHOLECYSTECTOMY    . COLONOSCOPY     Hx: of  . EYE SURGERY     catarac  . 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 HERNIA REPAIR    . LUMBAR LAMINECTOMY/DECOMPRESSION MICRODISCECTOMY Left 07/05/2013   Procedure: LUMBAR LAMINECTOMY/DECOMPRESSION MICRODISCECTOMY 1 LEVEL Left Lumbar five-Sacral one;  Surgeon: Charlie Pitter, MD;  Location: [place] [facility];  Service: Neurosurgery;  Laterality: Left;  Left Lumbar Five-Sacral One Microdiskectomy  . VASCULAR SURGERY     varicose vein surgery    There were no vitals filed for this visit.      Subjective Assessment - 03/14/17 1505    Subjective No improvements yet. I have been doing the exercises.  The bridges are the hardest.    Currently in Pain? Yes   Pain Score --  sore vs pain   Pain Location Back   Pain Orientation  Right;Left   Pain Descriptors / Indicators Sore;Numbness   Pain Type Chronic pain   Pain Radiating Towards lateral thighs,  left foot nomb   Pain Frequency Constant  numb intermitant,  soreness constant   Aggravating Factors  Walking and standing longer   Pain Relieving Factors sitting,  resting heat, medication                         [facility] Adult PT Treatment/Exercise - 03/14/17 0001      Standardized Balance Assessment   Standardized Balance Assessment Berg Balance Test     Berg Balance Test   Sit to Stand Able to stand  independently using hands   Standing Unsupported Able to stand safely 2 minutes   Sitting with Back Unsupported but Feet Supported on Floor or Stool Able to sit safely and securely 2 minutes   Stand to Sit Sits safely with minimal use of hands   Transfers Able to transfer safely, minor use of hands   Standing Unsupported with Eyes Closed Able to stand 10 seconds safely   Standing Ubsupported with Feet Together Able to place feet together independently and stand 1 minute  safely   From Standing, Reach Forward with Outstretched Arm Can reach forward >12 cm safely (5")   From Standing Position, Pick up Object from [place] to pick up shoe safely and easily   From Standing Position, Turn to Look Behind Over each Shoulder Turn sideways only but maintains balance   Turn 360 Degrees Able to turn 360 degrees safely but slowly   Standing Unsupported, Alternately Place Feet on Step/Stool Able to complete 4 steps without aid or supervision   Standing Unsupported, One Foot in Front Needs help to step but can hold 15 seconds   Standing on One Leg Tries to lift leg/unable to hold 3 seconds but remains standing independently   Total Score 42     Lumbar Exercises: Stretches   Lower Trunk Rotation Limitations good technique   Piriformis Stretch 1 rep;30 seconds  each     Lumbar Exercises: Supine   Bent Knee Raise 5 seconds   Bent Knee Raise Limitations  correctly done   Bridge 5 reps   Bridge Limitations cues cramps     Modalities   Modalities Moist Heat     Moist Heat Therapy   Number Minutes Moist Heat 15 Minutes   Moist Heat Location Hip  left, sidelying with 2 pillows between knees     Manual Therapy   Manual therapy comments tissue softened   Soft tissue mobilization Instrument assist , and manual soft tissue work Left low back hip gluteal and lateral thigh to knee.                   PT Short Term Goals - 03/14/17 1549      PT SHORT TERM GOAL #1   Title pt will be I with inital HEP (03/29/2017)   Baseline minor cues   Time 4   Period Weeks   Status On-going           PT Long Term Goals - 03/08/17 1444      PT LONG TERM GOAL #1   Title pt will be I with all HEP given as of last visit (05/03/2017)   Time 8   Period Weeks   Status New     PT LONG TERM GOAL #2   Title she will increase L hip abduction/ extension to >/= 4/5 to promote safety during standing and walking (05/03/2017)   Time 8   Period Weeks   Status New     PT LONG TERM GOAL #3   Title she will be able to stand / walk >/= 20 min with LRAD with </= 2/10 for endurance required for  community and home ambulation (05/03/2017)   Time 8   Period Weeks   Status New     PT LONG TERM GOAL #4   Title she will increase her FOTO score to </= 495 limited to demonstrate improvement in function.    Time 8   Period Weeks   Status New               Plan - 03/14/17 1547    Clinical Impression Statement BERG 42/56.  Minor cues with HEP, No new goals met yet.   PT Next Visit Plan assess/ review HEP, STM over L glute med/ min region, strength, PT to explain results of BERG modalities PRN   PT Home Exercise Plan supine marching, glute med/ piriformis stretch, sideling abduction, bridges   Consulted and Agree with Plan of Care Patient      Patient will  benefit from skilled therapeutic intervention in order to improve the following deficits and  impairments:  Abnormal gait, Pain, Improper body mechanics, Postural dysfunction, Decreased strength, Decreased endurance, Decreased activity tolerance, Increased fascial restricitons, Increased muscle spasms, Decreased balance  Visit Diagnosis: Pain in left hip  Chronic bilateral low back pain, with sciatica presence unspecified  Muscle weakness (generalized)  Other abnormalities of gait and mobility  Abnormal posture     Problem List Patient Active Problem List   Diagnosis Date Noted  . Influenza 01/17/2017  . Influenza A 01/16/2017  . HTN (hypertension) 01/16/2017  . Microcytic anemia 01/16/2017  . Acute hyponatremia 01/16/2017  . Asthma 01/16/2017  . UTI (urinary tract infection) 01/16/2017  . Anemia   . Respiratory distress   . Neck pain 01/11/2017  . Acute pain of left shoulder 01/11/2017  . Left leg pain 01/06/2015  . Varicose veins of both lower extremities with complications 71/21/9758  . HNP (herniated nucleus pulposus), lumbar 07/05/2013    Beverly Jimenez PTA 03/14/2017, 3:58 PM  [place], [place], [address] Phone: [phone]   Fax:  [phone]  Name: Beverly Jimenez MRN: [id number] Date of Birth: 1925/04/18

## 2017-03-16 ENCOUNTER — Encounter: Payer: Self-pay | Admitting: Physical Therapy

## 2017-03-16 ENCOUNTER — Ambulatory Visit: Payer: Medicare Other | Admitting: Physical Therapy

## 2017-03-16 DIAGNOSIS — R293 Abnormal posture: Secondary | ICD-10-CM

## 2017-03-16 DIAGNOSIS — M545 Low back pain: Secondary | ICD-10-CM

## 2017-03-16 DIAGNOSIS — R2689 Other abnormalities of gait and mobility: Secondary | ICD-10-CM

## 2017-03-16 DIAGNOSIS — M6281 Muscle weakness (generalized): Secondary | ICD-10-CM

## 2017-03-16 DIAGNOSIS — M25552 Pain in left hip: Secondary | ICD-10-CM | POA: Diagnosis not present

## 2017-03-16 DIAGNOSIS — G8929 Other chronic pain: Secondary | ICD-10-CM

## 2017-03-16 NOTE — Patient Instructions (Signed)

## 2017-03-16 NOTE — Therapy (Signed)
[place], [place], [address] Phone: [phone]   Fax:  [phone]  Physical Therapy Treatment  Patient Details  Name: Beverly Jimenez MRN: [id number] Date of Birth: Jan 15, 1925 Referring Provider: Mcarthur Rossetti MD  Encounter Date: 03/16/2017      PT End of Session - 03/16/17 1517    Visit Number 3   Number of Visits 17   Date for PT Re-Evaluation 05/03/17   PT Start Time 1324   PT Stop Time 1500   PT Time Calculation (min) 43 min   Activity Tolerance Patient tolerated treatment well   Behavior During Therapy [facility] for tasks assessed/performed      Past Medical History:  Diagnosis Date  . Anemia   . Arthritis    shoulder and knee pain  . Asthma   . Asthma   . Depression    Hx; of situational depression  . GERD (gastroesophageal reflux disease)   . Glaucoma    Hx; of  . High cholesterol   . Myalgia   . Numbness and tingling    Hx: of B/LLE  . Osteoporosis   . Pneumonia    Hx: of "years ago"  . Shortness of breath     Past Surgical History:  Procedure Laterality Date  . APPENDECTOMY    . BACK SURGERY    . CHOLECYSTECTOMY    . COLONOSCOPY     Hx: of  . EYE SURGERY     catarac  . 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 HERNIA REPAIR    . LUMBAR LAMINECTOMY/DECOMPRESSION MICRODISCECTOMY Left 07/05/2013   Procedure: LUMBAR LAMINECTOMY/DECOMPRESSION MICRODISCECTOMY 1 LEVEL Left Lumbar five-Sacral one;  Surgeon: Charlie Pitter, MD;  Location: [place] [facility];  Service: Neurosurgery;  Laterality: Left;  Left Lumbar Five-Sacral One Microdiskectomy  . VASCULAR SURGERY     varicose vein surgery    There were no vitals filed for this visit.      Subjective Assessment - 03/16/17 1416    Subjective Hip is less sore today (LT)     Currently in Pain? Yes   Pain Score 4    Pain Location Back   Pain Orientation Right;Left   Pain Descriptors / Indicators Sore   Pain Radiating  Towards into both legs                         [facility] Adult PT Treatment/Exercise - 03/16/17 0001      Lumbar Exercises: Stretches   Active Hamstring Stretch 3 reps;30 seconds  HEp   Lower Trunk Rotation 3 reps  10 seconds   Quad Stretch Limitations Unable to tolerte this stretch and anterior hip stretch  due to strain on back.    Piriformis Stretch 3 reps;30 seconds  both     Manual Therapy   Manual Therapy Taping   Manual therapy comments tissue softened   Soft tissue mobilization right side manual today  instrument assist and manual with PTA and PTA student Concur   [place] Lateral hip right  "*" pattern  50 % mid 1/3.                PT Education - 03/16/17 1516    Education provided Yes   Education Details HEP,  ADL info   Person(s) Educated Patient   Methods Explanation;Tactile cues;Verbal cues;Handout;Demonstration   Comprehension  Verbalized understanding;Returned demonstration;Need further instruction  ADL issued, not reviewed.  She is to read and we will review soon,          PT Short Term Goals - 03/14/17 1549      PT SHORT TERM GOAL #1   Title pt will be I with inital HEP (03/29/2017)   Baseline minor cues   Time 4   Period Weeks   Status On-going           PT Long Term Goals - 03/08/17 1444      PT LONG TERM GOAL #1   Title pt will be I with all HEP given as of last visit (05/03/2017)   Time 8   Period Weeks   Status New     PT LONG TERM GOAL #2   Title she will increase L hip abduction/ extension to >/= 4/5 to promote safety during standing and walking (05/03/2017)   Time 8   Period Weeks   Status New     PT LONG TERM GOAL #3   Title she will be able to stand / walk >/= 20 min with LRAD with </= 2/10 for endurance required for  community and home ambulation (05/03/2017)   Time 8   Period Weeks   Status New     PT LONG TERM GOAL #4   Title she will increase her FOTO  score to </= 495 limited to demonstrate improvement in function.    Time 8   Period Weeks   Status New               Plan - 03/16/17 1518    Clinical Impression Statement Progress toward HEP.   Patient has HEP memorized,  she may need minor cues to perfect technique.  Flexibility and manual focus today.   PT Next Visit Plan  PT to explain results of BERG modalities and manual  PRN,  Assess tape.  Work toward goals.    PT Home Exercise Plan supine marching, glute med/ piriformis stretch, sideling abduction, bridges   Consulted and Agree with Plan of Care Patient      Patient will benefit from skilled therapeutic intervention in order to improve the following deficits and impairments:  Abnormal gait, Pain, Improper body mechanics, Postural dysfunction, Decreased strength, Decreased endurance, Decreased activity tolerance, Increased fascial restricitons, Increased muscle spasms, Decreased balance  Visit Diagnosis: Pain in left hip  Chronic bilateral low back pain, with sciatica presence unspecified  Muscle weakness (generalized)  Other abnormalities of gait and mobility  Abnormal posture     Problem List Patient Active Problem List   Diagnosis Date Noted  . Influenza 01/17/2017  . Influenza A 01/16/2017  . HTN (hypertension) 01/16/2017  . Microcytic anemia 01/16/2017  . Acute hyponatremia 01/16/2017  . Asthma 01/16/2017  . UTI (urinary tract infection) 01/16/2017  . Anemia   . Respiratory distress   . Neck pain 01/11/2017  . Acute pain of left shoulder 01/11/2017  . Left leg pain 01/06/2015  . Varicose veins of both lower extremities with complications 66/44/0347  . HNP (herniated nucleus pulposus), lumbar 07/05/2013    HARRIS,KAREN PTA 03/16/2017, 3:23 PM  [place], [place], [address] Phone: [phone]   Fax:  [phone]  Name: Beverly Jimenez MRN: [id number] Date of Birth:  Jul 25, 1925

## 2017-03-20 ENCOUNTER — Ambulatory Visit: Payer: Medicare Other | Admitting: Physical Therapy

## 2017-03-20 ENCOUNTER — Encounter: Payer: Self-pay | Admitting: Physical Therapy

## 2017-03-20 DIAGNOSIS — R293 Abnormal posture: Secondary | ICD-10-CM | POA: Diagnosis not present

## 2017-03-20 DIAGNOSIS — R2689 Other abnormalities of gait and mobility: Secondary | ICD-10-CM

## 2017-03-20 DIAGNOSIS — M25552 Pain in left hip: Secondary | ICD-10-CM | POA: Diagnosis not present

## 2017-03-20 DIAGNOSIS — M545 Low back pain: Secondary | ICD-10-CM

## 2017-03-20 DIAGNOSIS — M6281 Muscle weakness (generalized): Secondary | ICD-10-CM | POA: Diagnosis not present

## 2017-03-20 DIAGNOSIS — G8929 Other chronic pain: Secondary | ICD-10-CM | POA: Diagnosis not present

## 2017-03-20 NOTE — Therapy (Signed)
[place], [place], [address] Phone: [phone]   Fax:  [phone]  Physical Therapy Treatment  Patient Details  Name: Beverly Jimenez MRN: [id number] Date of Birth: 02-19-1925 Referring Provider: Mcarthur Rossetti MD  Encounter Date: 03/20/2017      PT End of Session - 03/20/17 1446    Visit Number 4   Number of Visits 17   Date for PT Re-Evaluation 05/03/17   PT Start Time 1025   PT Stop Time 1456   PT Time Calculation (min) 48 min   Activity Tolerance Patient tolerated treatment well   Behavior During Therapy [facility] for tasks assessed/performed      Past Medical History:  Diagnosis Date  . Anemia   . Arthritis    shoulder and knee pain  . Asthma   . Asthma   . Depression    Hx; of situational depression  . GERD (gastroesophageal reflux disease)   . Glaucoma    Hx; of  . High cholesterol   . Myalgia   . Numbness and tingling    Hx: of B/LLE  . Osteoporosis   . Pneumonia    Hx: of "years ago"  . Shortness of breath     Past Surgical History:  Procedure Laterality Date  . APPENDECTOMY    . BACK SURGERY    . CHOLECYSTECTOMY    . COLONOSCOPY     Hx: of  . EYE SURGERY     catarac  . 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 HERNIA REPAIR    . LUMBAR LAMINECTOMY/DECOMPRESSION MICRODISCECTOMY Left 07/05/2013   Procedure: LUMBAR LAMINECTOMY/DECOMPRESSION MICRODISCECTOMY 1 LEVEL Left Lumbar five-Sacral one;  Surgeon: Charlie Pitter, MD;  Location: [place] [facility];  Service: Neurosurgery;  Laterality: Left;  Left Lumbar Five-Sacral One Microdiskectomy  . VASCULAR SURGERY     varicose vein surgery    There were no vitals filed for this visit.      Subjective Assessment - 03/20/17 1409    Subjective "still having some soreness in the hips. sometimes I feel like I am getting stronger and other days I feel like I am not"    Currently in Pain? Yes   Pain Score 4    Pain Location  Hip   Pain Orientation Right;Left   Pain Descriptors / Indicators Sore   Pain Type Chronic pain   Aggravating Factors  walking/ standing   Pain Relieving Factors sitting and proping feet, medication for pain PRN.                          [facility] Adult PT Treatment/Exercise - 03/20/17 1411      Lumbar Exercises: Aerobic   Stationary Bike Nu-step L4 x 5 min UE/lE     Knee/Hip Exercises: Seated   Sit to Sand 1 set;10 reps     Knee/Hip Exercises: Sidelying   Hip ABduction 2 sets;10 reps   Clams 1 set to fatigue  progressed to clamshell as pt      Moist Heat Therapy   Number Minutes Moist Heat 10 Minutes   Moist Heat Location Hip     Manual Therapy   Manual Therapy Joint mobilization   Manual therapy comments DTM over the glute med/ min and piriformis   Joint Mobilization long axis distraction grade 3, posterior mobs grade 3   Soft tissue mobilization L glute med/ min manual trigger point release,  and DTM over glute med/                PT Education - 03/20/17 1446    Education provided Yes   Education Details sit to stand biomechanics with proper form avoiding knees on the chair.   Person(s) Educated Patient   Methods Explanation;Verbal cues   Comprehension Verbalized understanding;Verbal cues required          PT Short Term Goals - 03/14/17 1549      PT SHORT TERM GOAL #1   Title pt will be I with inital HEP (03/29/2017)   Baseline minor cues   Time 4   Period Weeks   Status On-going           PT Long Term Goals - 03/08/17 1444      PT LONG TERM GOAL #1   Title pt will be I with all HEP given as of last visit (05/03/2017)   Time 8   Period Weeks   Status New     PT LONG TERM GOAL #2   Title she will increase L hip abduction/ extension to >/= 4/5 to promote safety during standing and walking (05/03/2017)   Time 8   Period Weeks   Status New     PT LONG TERM GOAL #3   Title she will be able to stand / walk >/= 20 min with LRAD  with </= 2/10 for endurance required for  community and home ambulation (05/03/2017)   Time 8   Period Weeks   Status New     PT LONG TERM GOAL #4   Title she will increase her FOTO score to </= 495 limited to demonstrate improvement in function.    Time 8   Period Weeks   Status New               Plan - 03/20/17 1447    Clinical Impression Statement Mrs. Oatley reports only 4/10 pain today. continued with focus on hip strengtheing specifically hip extension/ abduction. educated on sit to stand mechanics to prevent potiental fall risk due to use of pushing knees against the table. utilized MHP post session to calm the knee down.    PT Next Visit Plan  PT to explain results of BERG modalities and manual  PRN, Work toward goals. hip strengthening/ manual PRN.    Consulted and Agree with Plan of Care Patient      Patient will benefit from skilled therapeutic intervention in order to improve the following deficits and impairments:  Abnormal gait, Pain, Improper body mechanics, Postural dysfunction, Decreased strength, Decreased endurance, Decreased activity tolerance, Increased fascial restricitons, Increased muscle spasms, Decreased balance  Visit Diagnosis: Pain in left hip  Chronic bilateral low back pain, with sciatica presence unspecified  Muscle weakness (generalized)  Other abnormalities of gait and mobility  Abnormal posture     Problem List Patient Active Problem List   Diagnosis Date Noted  . Influenza 01/17/2017  . Influenza A 01/16/2017  . HTN (hypertension) 01/16/2017  . Microcytic anemia 01/16/2017  . Acute hyponatremia 01/16/2017  . Asthma 01/16/2017  . UTI (urinary tract infection) 01/16/2017  . Anemia   . Respiratory distress   . Neck pain 01/11/2017  . Acute pain of left shoulder 01/11/2017  . Left leg pain 01/06/2015  . Varicose veins of both lower extremities with complications 78/24/2353  . HNP (herniated nucleus pulposus), lumbar 07/05/2013    Kristoffer Leamon PT, DPT, LAT, ATC  03/20/17  2:50 PM  [place], [place], [address] Phone: [phone]   Fax:  [phone]  Name: Beverly Jimenez MRN: [id number] Date of Birth: 1925-05-28

## 2017-03-22 ENCOUNTER — Encounter: Payer: Self-pay | Admitting: Physical Therapy

## 2017-03-22 ENCOUNTER — Ambulatory Visit: Payer: Medicare Other | Admitting: Physical Therapy

## 2017-03-22 DIAGNOSIS — R293 Abnormal posture: Secondary | ICD-10-CM

## 2017-03-22 DIAGNOSIS — G8929 Other chronic pain: Secondary | ICD-10-CM

## 2017-03-22 DIAGNOSIS — M545 Low back pain: Secondary | ICD-10-CM

## 2017-03-22 DIAGNOSIS — M6281 Muscle weakness (generalized): Secondary | ICD-10-CM | POA: Diagnosis not present

## 2017-03-22 DIAGNOSIS — M25552 Pain in left hip: Secondary | ICD-10-CM | POA: Diagnosis not present

## 2017-03-22 DIAGNOSIS — R2689 Other abnormalities of gait and mobility: Secondary | ICD-10-CM

## 2017-03-22 NOTE — Therapy (Signed)
[place], [place], [address] Phone: [phone]   Fax:  [phone]  Physical Therapy Treatment  Patient Details  Name: Beverly Jimenez MRN: [id number] Date of Birth: 1925/11/20 Referring Provider: Mcarthur Rossetti MD  Encounter Date: 03/22/2017      PT End of Session - 03/22/17 1442    Visit Number 5   Number of Visits 17   Date for PT Re-Evaluation 05/03/17   PT Start Time 3244   PT Stop Time 1504   PT Time Calculation (min) 49 min   Activity Tolerance Patient tolerated treatment well   Behavior During Therapy [facility] for tasks assessed/performed      Past Medical History:  Diagnosis Date  . Anemia   . Arthritis    shoulder and knee pain  . Asthma   . Asthma   . Depression    Hx; of situational depression  . GERD (gastroesophageal reflux disease)   . Glaucoma    Hx; of  . High cholesterol   . Myalgia   . Numbness and tingling    Hx: of B/LLE  . Osteoporosis   . Pneumonia    Hx: of "years ago"  . Shortness of breath     Past Surgical History:  Procedure Laterality Date  . APPENDECTOMY    . BACK SURGERY    . CHOLECYSTECTOMY    . COLONOSCOPY     Hx: of  . EYE SURGERY     catarac  . 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 HERNIA REPAIR    . LUMBAR LAMINECTOMY/DECOMPRESSION MICRODISCECTOMY Left 07/05/2013   Procedure: LUMBAR LAMINECTOMY/DECOMPRESSION MICRODISCECTOMY 1 LEVEL Left Lumbar five-Sacral one;  Surgeon: Charlie Pitter, MD;  Location: [place] [facility];  Service: Neurosurgery;  Laterality: Left;  Left Lumbar Five-Sacral One Microdiskectomy  . VASCULAR SURGERY     varicose vein surgery    There were no vitals filed for this visit.      Subjective Assessment - 03/22/17 1421    Subjective "I feel like I am doing better today"    Currently in Pain? Yes   Pain Score 4    Pain Location Hip   Pain Orientation Left;Right                         [facility]  Adult PT Treatment/Exercise - 03/22/17 1423      Lumbar Exercises: Aerobic   Stationary Bike Nu-step L5 x 6 min UE/lE     Knee/Hip Exercises: Standing   Gait Training 2 x 50 ft with SPC  verbal cues to stand up straight, keep bottom tucked      Knee/Hip Exercises: Seated   Sit to Sand 1 set;10 reps  cues to avoid pushin from table with hands or knees     Knee/Hip Exercises: Supine   Bridges 5 reps;1 set;Both  tactile cues to raise up     Knee/Hip Exercises: Sidelying   Hip ABduction Both;2 sets;10 reps     Manual Therapy   Manual therapy comments DTM over the glute med/ min and piriformis   Soft tissue mobilization L glute med/ min manual trigger point release, and DTM over glute med/             Balance Exercises - 03/22/17 1451      Balance Exercises: Standing   Standing Eyes Opened Narrow base of support (BOS);2 reps   Standing Eyes Closed  Narrow base of support (BOS);2 reps;30 secs             PT Short Term Goals - 03/14/17 1549      PT SHORT TERM GOAL #1   Title pt will be I with inital HEP (03/29/2017)   Baseline minor cues   Time 4   Period Weeks   Status On-going           PT Long Term Goals - 03/08/17 1444      PT LONG TERM GOAL #1   Title pt will be I with all HEP given as of last visit (05/03/2017)   Time 8   Period Weeks   Status New     PT LONG TERM GOAL #2   Title she will increase L hip abduction/ extension to >/= 4/5 to promote safety during standing and walking (05/03/2017)   Time 8   Period Weeks   Status New     PT LONG TERM GOAL #3   Title she will be able to stand / walk >/= 20 min with LRAD with </= 2/10 for endurance required for  community and home ambulation (05/03/2017)   Time 8   Period Weeks   Status New     PT LONG TERM GOAL #4   Title she will increase her FOTO score to </= 495 limited to demonstrate improvement in function.    Time 8   Period Weeks   Status New               Plan - 03/22/17 1457     Clinical Impression Statement Mrs. Gauer reports 4/10 pain today but states she feels like she is doing better. continued hip strengthening in supine and sitting which she performed well requiring minimal cues for proper form. Began gait training which she requires cues to stand up straigth.    PT Next Visit Plan strengtheing of the hips, gait training, balance training.  Work toward goals. hip strengthening/ manual PRN.    PT Home Exercise Plan supine marching, glute med/ piriformis stretch, sideling abduction, bridges   Consulted and Agree with Plan of Care Patient      Patient will benefit from skilled therapeutic intervention in order to improve the following deficits and impairments:  Abnormal gait, Pain, Improper body mechanics, Postural dysfunction, Decreased strength, Decreased endurance, Decreased activity tolerance, Increased fascial restricitons, Increased muscle spasms, Decreased balance  Visit Diagnosis: Pain in left hip  Chronic bilateral low back pain, with sciatica presence unspecified  Muscle weakness (generalized)  Other abnormalities of gait and mobility  Abnormal posture     Problem List Patient Active Problem List   Diagnosis Date Noted  . Influenza 01/17/2017  . Influenza A 01/16/2017  . HTN (hypertension) 01/16/2017  . Microcytic anemia 01/16/2017  . Acute hyponatremia 01/16/2017  . Asthma 01/16/2017  . UTI (urinary tract infection) 01/16/2017  . Anemia   . Respiratory distress   . Neck pain 01/11/2017  . Acute pain of left shoulder 01/11/2017  . Left leg pain 01/06/2015  . Varicose veins of both lower extremities with complications 77/82/4235  . HNP (herniated nucleus pulposus), lumbar 07/05/2013   Starr [place] PT, DPT, LAT, ATC  03/22/17  3:00 PM      [facility] [address] [place], [place], [address] Phone: [phone]   Fax:  [phone]  Name: Beverly Jimenez MRN:  [id number] Date of Birth: 10-Nov-1925

## 2017-03-27 ENCOUNTER — Encounter (INDEPENDENT_AMBULATORY_CARE_PROVIDER_SITE_OTHER): Payer: Self-pay | Admitting: Physician Assistant

## 2017-03-27 ENCOUNTER — Ambulatory Visit (INDEPENDENT_AMBULATORY_CARE_PROVIDER_SITE_OTHER): Payer: Medicare Other | Admitting: Physician Assistant

## 2017-03-27 DIAGNOSIS — R7301 Impaired fasting glucose: Secondary | ICD-10-CM | POA: Diagnosis not present

## 2017-03-27 DIAGNOSIS — M5442 Lumbago with sciatica, left side: Secondary | ICD-10-CM

## 2017-03-27 DIAGNOSIS — R5383 Other fatigue: Secondary | ICD-10-CM | POA: Diagnosis not present

## 2017-03-27 DIAGNOSIS — E7800 Pure hypercholesterolemia, unspecified: Secondary | ICD-10-CM | POA: Diagnosis not present

## 2017-03-27 DIAGNOSIS — D649 Anemia, unspecified: Secondary | ICD-10-CM | POA: Diagnosis not present

## 2017-03-27 DIAGNOSIS — M5416 Radiculopathy, lumbar region: Secondary | ICD-10-CM | POA: Diagnosis not present

## 2017-03-27 DIAGNOSIS — G8929 Other chronic pain: Secondary | ICD-10-CM | POA: Insufficient documentation

## 2017-03-27 DIAGNOSIS — F458 Other somatoform disorders: Secondary | ICD-10-CM | POA: Diagnosis not present

## 2017-03-27 DIAGNOSIS — M810 Age-related osteoporosis without current pathological fracture: Secondary | ICD-10-CM | POA: Diagnosis not present

## 2017-03-27 DIAGNOSIS — E871 Hypo-osmolality and hyponatremia: Secondary | ICD-10-CM | POA: Diagnosis not present

## 2017-03-27 DIAGNOSIS — M7062 Trochanteric bursitis, left hip: Secondary | ICD-10-CM | POA: Diagnosis not present

## 2017-03-27 DIAGNOSIS — J45909 Unspecified asthma, uncomplicated: Secondary | ICD-10-CM | POA: Diagnosis not present

## 2017-03-27 DIAGNOSIS — Z6830 Body mass index (BMI) 30.0-30.9, adult: Secondary | ICD-10-CM | POA: Diagnosis not present

## 2017-03-27 DIAGNOSIS — I10 Essential (primary) hypertension: Secondary | ICD-10-CM | POA: Diagnosis not present

## 2017-03-27 MED ORDER — LIDOCAINE HCL 1 % IJ SOLN
2.0000 mL | INTRAMUSCULAR | Status: AC | PRN
  Administered 2017-03-27: 2 mL

## 2017-03-27 MED ORDER — METHYLPREDNISOLONE ACETATE 40 MG/ML IJ SUSP
40.0000 mg | INTRAMUSCULAR | Status: AC | PRN
  Administered 2017-03-27: 40 mg via INTRA_ARTICULAR

## 2017-03-27 NOTE — Progress Notes (Signed)
Office Visit Note   Patient: Beverly Jimenez           Date of Birth: Apr 05, 1925           MRN: [id number] Visit Date: 03/27/2017              Requested by: Marton Redwood, MD [address] [place], [place] [address] PCP: Marton Redwood, MD   Assessment & Plan: Visit Diagnoses:  1. Chronic left-sided low back pain with left-sided sciatica   2. Trochanteric bursitis, left hip     Plan: Continue physical therapy for her back in her hip pain. Discussed with her and her daughter today if the trochanteric injection given today does not resolve most of her pain were pain is bearable refer her back to [facility] and Dr. Assunta Curtis for treatment of her low back pain and radicular symptoms down her left leg.  Follow-Up Instructions: Return if symptoms worsen or fail to improve.   Orders:  Orders Placed This Encounter  Procedures  . Large Joint Injection/Arthrocentesis   No orders of the defined types were placed in this encounter.     Procedures: Large Joint Inj Date/Time: 03/27/2017 2:03 PM Performed by: Pete Pelt Authorized by: Pete Pelt   Consent Given by:  Patient Indications:  Pain Location:  Hip Site:  L greater trochanter Needle Size:  22 G Needle Length:  1.5 inches and 3.5 inches Approach:  Lateral Ultrasound Guidance: No   Fluoroscopic Guidance: No   Arthrogram: No   Medications:  40 mg methylPREDNISolone acetate 40 MG/ML; 2 mL lidocaine 1 % Aspiration Attempted: No   Patient tolerance:  Patient tolerated the procedure well with no immediate complications     Clinical Data: No additional findings.   Subjective: Chief Complaint  Patient presents with  . Lower Back - Pain, Follow-up  . Left Leg - Pain, Follow-up  . Left Hip - Pain, Follow-up    HPI Beverly Jimenez returns today follow-up of her low back and left hip pain. She's been in physical therapy and is found this beneficial. She still has some soreness and stiffness in the right hip and some  pain down the left leg. She's had a fusion of her lower lumbar spine by Dr. Trenton Gammon. Had epidural steroid injections in the past. Review of Systems   Objective: Vital Signs: There were no vitals taken for this visit.  Physical Exam Well-developed well-nourished somewhat frail-appearing female in no acute distress was with a slow antalgic gait. Ortho Exam Allergic history she has 5 out of 5 strengths throughout against resistance. Negative straight leg raise bilaterally. Tenderness over the left trochanteric region. Good range of motion bilateral hips without pain. Specialty Comments:  No specialty comments available.  Imaging: No results found.   PMFS History: Patient Active Problem List   Diagnosis Date Noted  . Chronic left-sided low back pain with left-sided sciatica 03/27/2017  . Influenza 01/17/2017  . Influenza A 01/16/2017  . HTN (hypertension) 01/16/2017  . Microcytic anemia 01/16/2017  . Acute hyponatremia 01/16/2017  . Asthma 01/16/2017  . UTI (urinary tract infection) 01/16/2017  . Anemia   . Respiratory distress   . Neck pain 01/11/2017  . Acute pain of left shoulder 01/11/2017  . Left leg pain 01/06/2015  . Varicose veins of both lower extremities with complications 27/78/2423  . HNP (herniated nucleus pulposus), lumbar 07/05/2013   Past Medical History:  Diagnosis Date  . Anemia   . Arthritis    shoulder and  knee pain  . Asthma   . Asthma   . Depression    Hx; of situational depression  . GERD (gastroesophageal reflux disease)   . Glaucoma    Hx; of  . High cholesterol   . Myalgia   . Numbness and tingling    Hx: of B/LLE  . Osteoporosis   . Pneumonia    Hx: of "years ago"  . Shortness of breath     Family History  Problem Relation Age of Onset  . Varicose Veins Mother   . Diabetes Brother   . Heart disease Brother   . Hypertension Brother   . Varicose Veins Brother   . Hypertension Other   . Heart disease Other   . Glaucoma Other   .  Diabetes Other   . Varicose Veins Sister   . Varicose Veins Daughter     Past Surgical History:  Procedure Laterality Date  . APPENDECTOMY    . BACK SURGERY    . CHOLECYSTECTOMY    . COLONOSCOPY     Hx: of  . EYE SURGERY     catarac  . 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 HERNIA REPAIR    . LUMBAR LAMINECTOMY/DECOMPRESSION MICRODISCECTOMY Left 07/05/2013   Procedure: LUMBAR LAMINECTOMY/DECOMPRESSION MICRODISCECTOMY 1 LEVEL Left Lumbar five-Sacral one;  Surgeon: Charlie Pitter, MD;  Location: [place] [facility];  Service: Neurosurgery;  Laterality: Left;  Left Lumbar Five-Sacral One Microdiskectomy  . VASCULAR SURGERY     varicose vein surgery   Social History   Occupational History  . Not on file.   Social History Main Topics  . Smoking status: Never Smoker  . Smokeless tobacco: Never Used  . Alcohol use No  . Drug use: No  . Sexual activity: Not on file

## 2017-03-28 ENCOUNTER — Encounter: Payer: Self-pay | Admitting: Physical Therapy

## 2017-03-28 ENCOUNTER — Ambulatory Visit: Payer: Medicare Other | Admitting: Physical Therapy

## 2017-03-28 DIAGNOSIS — M6281 Muscle weakness (generalized): Secondary | ICD-10-CM

## 2017-03-28 DIAGNOSIS — R293 Abnormal posture: Secondary | ICD-10-CM

## 2017-03-28 DIAGNOSIS — G8929 Other chronic pain: Secondary | ICD-10-CM | POA: Diagnosis not present

## 2017-03-28 DIAGNOSIS — R2689 Other abnormalities of gait and mobility: Secondary | ICD-10-CM

## 2017-03-28 DIAGNOSIS — M25552 Pain in left hip: Secondary | ICD-10-CM | POA: Diagnosis not present

## 2017-03-28 DIAGNOSIS — M545 Low back pain: Secondary | ICD-10-CM

## 2017-03-28 DIAGNOSIS — K219 Gastro-esophageal reflux disease without esophagitis: Secondary | ICD-10-CM | POA: Diagnosis not present

## 2017-03-28 NOTE — Therapy (Signed)
[place], [place], [address] Phone: [phone]   Fax:  [phone]  Physical Therapy Treatment  Patient Details  Name: Beverly Jimenez MRN: [id number] Date of Birth: [birth year] Referring Provider: Mcarthur Rossetti MD  Encounter Date: 03/28/2017      PT End of Session - 03/28/17 1838    Visit Number 6   Number of Visits 17   Date for PT Re-Evaluation 05/03/17   PT Start Time 6629   PT Stop Time 1500   PT Time Calculation (min) 43 min   Activity Tolerance Patient tolerated treatment well   Behavior During Therapy [facility] for tasks assessed/performed      Past Medical History:  Diagnosis Date  . Anemia   . Arthritis    shoulder and knee pain  . Asthma   . Asthma   . Depression    Hx; of situational depression  . GERD (gastroesophageal reflux disease)   . Glaucoma    Hx; of  . High cholesterol   . Myalgia   . Numbness and tingling    Hx: of B/LLE  . Osteoporosis   . Pneumonia    Hx: of "years ago"  . Shortness of breath     Past Surgical History:  Procedure Laterality Date  . APPENDECTOMY    . BACK SURGERY    . CHOLECYSTECTOMY    . COLONOSCOPY     Hx: of  . EYE SURGERY     catarac  . 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 HERNIA REPAIR    . LUMBAR LAMINECTOMY/DECOMPRESSION MICRODISCECTOMY Left 07/05/2013   Procedure: LUMBAR LAMINECTOMY/DECOMPRESSION MICRODISCECTOMY 1 LEVEL Left Lumbar five-Sacral one;  Surgeon: Charlie Pitter, MD;  Location: [facility];  Service: Neurosurgery;  Laterality: Left;  Left Lumbar Five-Sacral One Microdiskectomy  . VASCULAR SURGERY     varicose vein surgery    There were no vitals filed for this visit.      Subjective Assessment - 03/28/17 1831    Subjective I am a little stronger.  I need work on balance and walking.    Currently in Pain? Yes   Pain Score 5    Pain Location Hip   Pain Orientation Right;Left   Pain Descriptors /  Indicators Sore   Pain Frequency Constant   Aggravating Factors  walking/ standing   Pain Relieving Factors sitting with feet up,  Meds as needed.                         [facility] Adult PT Treatment/Exercise - 03/28/17 0001      Ambulation/Gait   Pre-Gait Activities wall slides 10 x each right/left facing wall.  Patient able to do no pain,  she did have 1 LOB and self recovered for last rep woith fatigue.     Gait Comments Larg steps and weightshifting drills in parallel bars and in clinic with cane.  Posture improves for short periods of time.  She had no note of back gain with walking today.  She was able to consistantly increase her step length which  should  increase her safety.     High Level Balance   High Level Balance Comments in corner on compliant and non compliant surface.  balance exercises with SBA to CGA.  Rhomberg and tandem exercises with head, arm movimwnts,  weight shifts,  eyes open and closed.  patient noted hip pian with this  and had 2 sitting rests to ease the pain.  these were challanging.      Lumbar Exercises: Aerobic   Stationary Bike Nu-step L5 x 6 min UE/lE     Knee/Hip Exercises: Seated   Sit to Sand 10 reps  7/10 pain knees                PT Education - 03/28/17 1838    Education provided Yes   Education Details gait   Person(s) Educated Patient   Methods Explanation;Demonstration;Tactile cues;Verbal cues   Comprehension Verbalized understanding;Returned demonstration;Need further instruction          PT Short Term Goals - 03/28/17 1841      PT SHORT TERM GOAL #1   Title pt will be I with inital HEP (03/29/2017)   Time 4   Period Weeks   Status Unable to assess     PT SHORT TERM GOAL #2   Title pt will be able to verbalize and demo techniques to prevent and reduce L hip pain via RICE and HEP (03/29/2017)   Baseline she uses rest   Time 4   Period Weeks   Status On-going     PT SHORT TERM GOAL #3   Title she will  increase L hip flexion / extension by >/= 5 degree to promote functional mobility with </= 4/10 pain (03/29/2017)   Time 4   Period Weeks   Status Unable to assess           PT Long Term Goals - 03/08/17 1444      PT LONG TERM GOAL #1   Title pt will be I with all HEP given as of last visit (05/03/2017)   Time 8   Period Weeks   Status New     PT LONG TERM GOAL #2   Title she will increase L hip abduction/ extension to >/= 4/5 to promote safety during standing and walking (05/03/2017)   Time 8   Period Weeks   Status New     PT LONG TERM GOAL #3   Title she will be able to stand / walk >/= 20 min with LRAD with </= 2/10 for endurance required for  community and home ambulation (05/03/2017)   Time 8   Period Weeks   Status New     PT LONG TERM GOAL #4   Title she will increase her FOTO score to </= 495 limited to demonstrate improvement in function.    Time 8   Period Weeks   Status New               Plan - 03/28/17 1839    Clinical Impression Statement Beverly Jimenez worked hard today with both challanging balance and gait work.  No increased pain noted by patient at end of session.  She was fatigued.     PT Next Visit Plan strengtheing of the hips, gait training, balance training.  Work toward goals. hip strengthening/ manual PRN. Consider adding corner balance exercises for home.   PT Home Exercise Plan supine marching, glute med/ piriformis stretch, sideling abduction, bridges   Consulted and Agree with Plan of Care Patient      Patient will benefit from skilled therapeutic intervention in order to improve the following deficits and impairments:  Abnormal gait, Pain, Improper body mechanics, Postural dysfunction, Decreased strength, Decreased endurance, Decreased activity tolerance, Increased fascial restricitons, Increased muscle spasms, Decreased balance  Visit Diagnosis: Pain in left hip  Chronic bilateral low back  pain, with sciatica presence  unspecified  Muscle weakness (generalized)  Other abnormalities of gait and mobility  Abnormal posture     Problem List Patient Active Problem List   Diagnosis Date Noted  . Chronic left-sided low back pain with left-sided sciatica 03/27/2017  . Influenza 01/17/2017  . Influenza A 01/16/2017  . HTN (hypertension) 01/16/2017  . Microcytic anemia 01/16/2017  . Acute hyponatremia 01/16/2017  . Asthma 01/16/2017  . UTI (urinary tract infection) 01/16/2017  . Anemia   . Respiratory distress   . Neck pain 01/11/2017  . Acute pain of left shoulder 01/11/2017  . Left leg pain 01/06/2015  . Varicose veins of both lower extremities with complications 26/83/4196  . HNP (herniated nucleus pulposus), lumbar 07/05/2013    HARRIS,KAREN PTA 03/28/2017, 6:43 PM  [place], [place], [address] Phone: [phone]   Fax:  [phone]  Name: Beverly Jimenez MRN: [id number] Date of Birth: 04-23-1925

## 2017-04-04 ENCOUNTER — Ambulatory Visit: Payer: Medicare Other | Attending: Internal Medicine | Admitting: Physical Therapy

## 2017-04-04 ENCOUNTER — Encounter: Payer: Self-pay | Admitting: Physical Therapy

## 2017-04-04 DIAGNOSIS — G8929 Other chronic pain: Secondary | ICD-10-CM | POA: Diagnosis not present

## 2017-04-04 DIAGNOSIS — M6281 Muscle weakness (generalized): Secondary | ICD-10-CM | POA: Diagnosis not present

## 2017-04-04 DIAGNOSIS — R293 Abnormal posture: Secondary | ICD-10-CM | POA: Diagnosis not present

## 2017-04-04 DIAGNOSIS — M25552 Pain in left hip: Secondary | ICD-10-CM

## 2017-04-04 DIAGNOSIS — H9212 Otorrhea, left ear: Secondary | ICD-10-CM | POA: Diagnosis not present

## 2017-04-04 DIAGNOSIS — R2689 Other abnormalities of gait and mobility: Secondary | ICD-10-CM | POA: Diagnosis not present

## 2017-04-04 DIAGNOSIS — L299 Pruritus, unspecified: Secondary | ICD-10-CM | POA: Diagnosis not present

## 2017-04-04 DIAGNOSIS — M545 Low back pain: Secondary | ICD-10-CM | POA: Diagnosis not present

## 2017-04-04 NOTE — Patient Instructions (Signed)
Achilles / Gastroc, Standing    Stand, right foot behind, heel on floor , leg straight, forward leg bent. Move hips forward. Hold __20-30_ seconds. Repeat _3__ times per session. Do 1___ sessions per day.  Copyright  VHI. All rights reserved.

## 2017-04-04 NOTE — Therapy (Signed)
[place], [place], [address] Phone: [phone]   Fax:  [phone]  Physical Therapy Treatment  Patient Details  Name: Beverly Jimenez MRN: [id number] Date of Birth: 06-Sep-1925 Referring Provider: Mcarthur Rossetti MD  Encounter Date: 04/04/2017      PT End of Session - 04/04/17 1615    Visit Number 7   Number of Visits 17   Date for PT Re-Evaluation 05/03/17   PT Start Time 9381   PT Stop Time 1517   PT Time Calculation (min) 60 min   Activity Tolerance Patient tolerated treatment well   Behavior During Therapy [facility] for tasks assessed/performed      Past Medical History:  Diagnosis Date  . Anemia   . Arthritis    shoulder and knee pain  . Asthma   . Asthma   . Depression    Hx; of situational depression  . GERD (gastroesophageal reflux disease)   . Glaucoma    Hx; of  . High cholesterol   . Myalgia   . Numbness and tingling    Hx: of B/LLE  . Osteoporosis   . Pneumonia    Hx: of "years ago"  . Shortness of breath     Past Surgical History:  Procedure Laterality Date  . APPENDECTOMY    . BACK SURGERY    . CHOLECYSTECTOMY    . COLONOSCOPY     Hx: of  . EYE SURGERY     catarac  . 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 HERNIA REPAIR    . LUMBAR LAMINECTOMY/DECOMPRESSION MICRODISCECTOMY Left 07/05/2013   Procedure: LUMBAR LAMINECTOMY/DECOMPRESSION MICRODISCECTOMY 1 LEVEL Left Lumbar five-Sacral one;  Surgeon: Charlie Pitter, MD;  Location: [facility];  Service: Neurosurgery;  Laterality: Left;  Left Lumbar Five-Sacral One Microdiskectomy  . VASCULAR SURGERY     varicose vein surgery    There were no vitals filed for this visit.      Subjective Assessment - 04/04/17 1427    Subjective I am strongfer.  I am a little better.  I am doing the exercises.   Currently in Pain? Yes   Pain Score 4    Pain Location Hip   Pain Orientation Right   Pain Descriptors /  Indicators Sore   Pain Frequency Intermittent   Aggravating Factors  worse in the morning   Pain Relieving Factors exercises   Multiple Pain Sites --  right knee 4/10 worse with end range stretches,  better with stretches,  and in the mornings,  sometimes she gets a cramp in lrft foot            [facility] PT Assessment - 04/04/17 0001      AROM   Right Hip Extension 10  hanging over mat 3/10 pain,  in sidelying gets neutral   Left Hip Flexion 103  113 AAROM supine     Strength   Left Hip ABduction 4-/5                     [facility] Adult PT Treatment/Exercise - 04/04/17 0001      Ambulation/Gait   Pre-Gait Activities weight shifts.   Gait Comments Large steps and weightshifting drills in parallel bars and in clinic with cane.  trunk rotation helped balance,  longer steps helped      High Level Balance   High Level Balance Comments walking forward and reverse.  CGA cues for  longer step length  At counter,  static and dynamic     Self-Care   Self-Care RICE   RICE discussed,  parient aware of concept,  she uses rest      Lumbar Exercises: Stretches   Single Knee to Chest Stretch 3 reps;20 seconds   Piriformis Stretch 1 rep;30 seconds     Lumbar Exercises: Aerobic   Stationary Bike Nu-step L5 x 6 min UE/lE     Lumbar Exercises: Supine   Bent Knee Raise 10 reps     Knee/Hip Exercises: Stretches   Gastroc Stretch 3 reps;20 seconds  HEP     Knee/Hip Exercises: Sidelying   Hip ABduction 1 set;10 reps;Both  3 reps cues min assist to keep position    Hip ABduction Limitations min assist for position,  hip flexion increased with fatigue.   Clams 10 X both     Modalities   Modalities Moist Heat     Moist Heat Therapy   Number Minutes Moist Heat 15 Minutes   Moist Heat Location Hip  sidelying with pillow between knees                PT Education - 04/04/17 1604    Education provided Yes   Education Details HEP,  RICE   Person(s) Educated Patient    Methods Explanation;Demonstration;Verbal cues;Handout   Comprehension Verbalized understanding;Returned demonstration          PT Short Term Goals - 04/04/17 1617      PT SHORT TERM GOAL #1   Title pt will be I with inital HEP (03/29/2017)   Baseline Has memorized,  Does all independently except bridges which hurt her back.   Time 4   Period Weeks   Status Achieved     PT SHORT TERM GOAL #2   Title pt will be able to verbalize and demo techniques to prevent and reduce L hip pain via RICE and HEP (03/29/2017)   Baseline Discussed RICE patient understands concept.  Uses exercises to help.   Time 4   Period Weeks   Status Partially Met     PT SHORT TERM GOAL #3   Title she will increase L hip flexion / extension by >/= 5 degree to promote functional mobility with </= 4/10 pain (03/29/2017)   Baseline Lt hip flexion 103 degrees active  4/10 pain   Time 4   Period Weeks   Status Partially Met           PT Long Term Goals - 03/08/17 1444      PT LONG TERM GOAL #1   Title pt will be I with all HEP given as of last visit (05/03/2017)   Time 8   Period Weeks   Status New     PT LONG TERM GOAL #2   Title she will increase L hip abduction/ extension to >/= 4/5 to promote safety during standing and walking (05/03/2017)   Time 8   Period Weeks   Status New     PT LONG TERM GOAL #3   Title she will be able to stand / walk >/= 20 min with LRAD with </= 2/10 for endurance required for  community and home ambulation (05/03/2017)   Time 8   Period Weeks   Status New     PT LONG TERM GOAL #4   Title she will increase her FOTO score to </= 495 limited to demonstrate improvement in function.    Time 8   Period Weeks  Status New             Patient will benefit from skilled therapeutic intervention in order to improve the following deficits and impairments:     Visit Diagnosis: Pain in left hip  Chronic bilateral low back pain, with sciatica presence  unspecified  Muscle weakness (generalized)  Other abnormalities of gait and mobility  Abnormal posture     Problem List Patient Active Problem List   Diagnosis Date Noted  . Chronic left-sided low back pain with left-sided sciatica 03/27/2017  . Influenza 01/17/2017  . Influenza A 01/16/2017  . HTN (hypertension) 01/16/2017  . Microcytic anemia 01/16/2017  . Acute hyponatremia 01/16/2017  . Asthma 01/16/2017  . UTI (urinary tract infection) 01/16/2017  . Anemia   . Respiratory distress   . Neck pain 01/11/2017  . Acute pain of left shoulder 01/11/2017  . Left leg pain 01/06/2015  . Varicose veins of both lower extremities with complications 02/54/8628  . HNP (herniated nucleus pulposus), lumbar 07/05/2013    Beverly Jimenez PTA 04/04/2017, 4:21 PM  [place], [place], [address] Phone: [phone]   Fax:  [phone]  Name: Beverly Jimenez MRN: [id number] Date of Birth: 12/28/1924

## 2017-04-06 ENCOUNTER — Encounter: Payer: Self-pay | Admitting: Physical Therapy

## 2017-04-06 ENCOUNTER — Ambulatory Visit: Payer: Medicare Other | Admitting: Physical Therapy

## 2017-04-06 DIAGNOSIS — M25552 Pain in left hip: Secondary | ICD-10-CM

## 2017-04-06 DIAGNOSIS — R2689 Other abnormalities of gait and mobility: Secondary | ICD-10-CM | POA: Diagnosis not present

## 2017-04-06 DIAGNOSIS — R293 Abnormal posture: Secondary | ICD-10-CM | POA: Diagnosis not present

## 2017-04-06 DIAGNOSIS — G8929 Other chronic pain: Secondary | ICD-10-CM | POA: Diagnosis not present

## 2017-04-06 DIAGNOSIS — M545 Low back pain: Secondary | ICD-10-CM | POA: Diagnosis not present

## 2017-04-06 DIAGNOSIS — M6281 Muscle weakness (generalized): Secondary | ICD-10-CM | POA: Diagnosis not present

## 2017-04-06 NOTE — Patient Instructions (Addendum)
Issued from ex drawer: Several days a week Ankle AROM all issued 10 x each,  0 second hold daily Daily Standing ankle heel lift X 10,  Toe lifts X 10 SLS 3 X 10 seconds each

## 2017-04-06 NOTE — Therapy (Signed)
[place], [place], [address] Phone: [phone]   Fax:  [phone]  Physical Therapy Treatment  Patient Details  Name: Beverly Jimenez MRN: [id number] Date of Birth: September 07, 1925 Referring Provider: Mcarthur Rossetti MD  Encounter Date: 04/06/2017      PT End of Session - 04/06/17 1500    Visit Number 8   Number of Visits 17   Date for PT Re-Evaluation 05/03/17   PT Start Time 4536   PT Stop Time 1500   PT Time Calculation (min) 45 min   Activity Tolerance Patient tolerated treatment well   Behavior During Therapy [facility] for tasks assessed/performed      Past Medical History:  Diagnosis Date  . Anemia   . Arthritis    shoulder and knee pain  . Asthma   . Asthma   . Depression    Hx; of situational depression  . GERD (gastroesophageal reflux disease)   . Glaucoma    Hx; of  . High cholesterol   . Myalgia   . Numbness and tingling    Hx: of B/LLE  . Osteoporosis   . Pneumonia    Hx: of "years ago"  . Shortness of breath     Past Surgical History:  Procedure Laterality Date  . APPENDECTOMY    . BACK SURGERY    . CHOLECYSTECTOMY    . COLONOSCOPY     Hx: of  . EYE SURGERY     catarac  . 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 HERNIA REPAIR    . LUMBAR LAMINECTOMY/DECOMPRESSION MICRODISCECTOMY Left 07/05/2013   Procedure: LUMBAR LAMINECTOMY/DECOMPRESSION MICRODISCECTOMY 1 LEVEL Left Lumbar five-Sacral one;  Surgeon: Charlie Pitter, MD;  Location: [facility];  Service: Neurosurgery;  Laterality: Left;  Left Lumbar Five-Sacral One Microdiskectomy  . VASCULAR SURGERY     varicose vein surgery    There were no vitals filed for this visit.      Subjective Assessment - 04/06/17 1428    Subjective Patient has numbness left foot pain at night. My Dr says it is from my sciatic                         [facility] Adult PT Treatment/Exercise - 04/06/17 0001      Knee/Hip Exercises: Standing   Heel Raises 10 reps   Heel Raises Limitations toe raises  HEP   SLS 3 x each holding counter  HEP     Ankle Exercises: Supine   T-Band 43 way 10 x each   Other Supine Ankle Exercises DFD,  Circles ABC's supination/pronation  New HEP                PT Education - 04/06/17 1447    Education provided Yes   Education Details HEP   Person(s) Educated Patient   Methods Explanation;Demonstration;Verbal cues;Handout   Comprehension Verbalized understanding;Returned demonstration          PT Short Term Goals - 04/04/17 1617      PT SHORT TERM GOAL #1   Title pt will be I with inital HEP (03/29/2017)   Baseline Has memorized,  Does all independently except bridges which hurt her back.   Time 4   Period Weeks   Status Achieved     PT SHORT TERM GOAL #2   Title pt will be able to verbalize and demo techniques to prevent and reduce L  hip pain via RICE and HEP (03/29/2017)   Baseline Discussed RICE patient understands concept.  Uses exercises to help.   Time 4   Period Weeks   Status Partially Met     PT SHORT TERM GOAL #3   Title she will increase L hip flexion / extension by >/= 5 degree to promote functional mobility with </= 4/10 pain (03/29/2017)   Baseline Lt hip flexion 103 degrees active  4/10 pain   Time 4   Period Weeks   Status Partially Met           PT Long Term Goals - 03/08/17 1444      PT LONG TERM GOAL #1   Title pt will be I with all HEP given as of last visit (05/03/2017)   Time 8   Period Weeks   Status New     PT LONG TERM GOAL #2   Title she will increase L hip abduction/ extension to >/= 4/5 to promote safety during standing and walking (05/03/2017)   Time 8   Period Weeks   Status New     PT LONG TERM GOAL #3   Title she will be able to stand / walk >/= 20 min with LRAD with </= 2/10 for endurance required for  community and home ambulation (05/03/2017)   Time 8   Period Weeks   Status New     PT LONG  TERM GOAL #4   Title she will increase her FOTO score to </= 495 limited to demonstrate improvement in function.    Time 8   Period Weeks   Status New               Plan - 04/06/17 1832    Clinical Impression Statement Strengthening today focused on foot and lower leg.  Patient thinks this is a big part of her other issues.  Progress toward HEP Goal.  Patient is unable to single leg stand without using her hands.   PT Next Visit Plan strengtheing of the hips, gait training, balance training.  Work toward goals. hip strengthening/ manual PRN. Consider adding corner balance exercises for home.  Check ankle exercises   PT Home Exercise Plan supine marching, glute med/ piriformis stretch, sideling abduction, bridges,  AROM ankle series,  Standing ankle strength   Consulted and Agree with Plan of Care Patient      Patient will benefit from skilled therapeutic intervention in order to improve the following deficits and impairments:  Abnormal gait, Pain, Improper body mechanics, Postural dysfunction, Decreased strength, Decreased endurance, Decreased activity tolerance, Increased fascial restricitons, Increased muscle spasms, Decreased balance  Visit Diagnosis: Pain in left hip  Chronic bilateral low back pain, with sciatica presence unspecified  Muscle weakness (generalized)  Other abnormalities of gait and mobility  Abnormal posture     Problem List Patient Active Problem List   Diagnosis Date Noted  . Chronic left-sided low back pain with left-sided sciatica 03/27/2017  . Influenza 01/17/2017  . Influenza A 01/16/2017  . HTN (hypertension) 01/16/2017  . Microcytic anemia 01/16/2017  . Acute hyponatremia 01/16/2017  . Asthma 01/16/2017  . UTI (urinary tract infection) 01/16/2017  . Anemia   . Respiratory distress   . Neck pain 01/11/2017  . Acute pain of left shoulder 01/11/2017  . Left leg pain 01/06/2015  . Varicose veins of both lower extremities with  complications 79/48/0165  . HNP (herniated nucleus pulposus), lumbar 07/05/2013    , PTA 04/06/2017, 6:35 PM  [place], [place], [address] Phone: [phone]   Fax:  [phone]  Name: Beverly Jimenez MRN: [id number] Date of Birth: 1925-05-28

## 2017-04-11 DIAGNOSIS — M810 Age-related osteoporosis without current pathological fracture: Secondary | ICD-10-CM | POA: Diagnosis not present

## 2017-04-18 ENCOUNTER — Encounter: Payer: Self-pay | Admitting: Physical Therapy

## 2017-04-18 ENCOUNTER — Ambulatory Visit: Payer: Medicare Other | Admitting: Physical Therapy

## 2017-04-18 DIAGNOSIS — R293 Abnormal posture: Secondary | ICD-10-CM

## 2017-04-18 DIAGNOSIS — M6281 Muscle weakness (generalized): Secondary | ICD-10-CM

## 2017-04-18 DIAGNOSIS — G8929 Other chronic pain: Secondary | ICD-10-CM | POA: Diagnosis not present

## 2017-04-18 DIAGNOSIS — M25552 Pain in left hip: Secondary | ICD-10-CM

## 2017-04-18 DIAGNOSIS — M545 Low back pain: Secondary | ICD-10-CM | POA: Diagnosis not present

## 2017-04-18 DIAGNOSIS — R2689 Other abnormalities of gait and mobility: Secondary | ICD-10-CM

## 2017-04-18 NOTE — Therapy (Signed)
[place], [place], [address] Phone: [phone]   Fax:  [phone]  Physical Therapy Treatment  Patient Details  Name: Beverly Jimenez MRN: [id number] Date of Birth: 08-30-1925 Referring Provider: Mcarthur Rossetti MD  Encounter Date: 04/18/2017      PT End of Session - 04/18/17 1300    Visit Number 9   Number of Visits 17   Date for PT Re-Evaluation 05/03/17   PT Start Time 1103   PT Stop Time 1145   PT Time Calculation (min) 42 min   Activity Tolerance Patient limited by lethargy   Behavior During Therapy [facility] for tasks assessed/performed      Past Medical History:  Diagnosis Date  . Anemia   . Arthritis    shoulder and knee pain  . Asthma   . Asthma   . Depression    Hx; of situational depression  . GERD (gastroesophageal reflux disease)   . Glaucoma    Hx; of  . High cholesterol   . Myalgia   . Numbness and tingling    Hx: of B/LLE  . Osteoporosis   . Pneumonia    Hx: of "years ago"  . Shortness of breath     Past Surgical History:  Procedure Laterality Date  . APPENDECTOMY    . BACK SURGERY    . CHOLECYSTECTOMY    . COLONOSCOPY     Hx: of  . EYE SURGERY     catarac  . 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 HERNIA REPAIR    . LUMBAR LAMINECTOMY/DECOMPRESSION MICRODISCECTOMY Left 07/05/2013   Procedure: LUMBAR LAMINECTOMY/DECOMPRESSION MICRODISCECTOMY 1 LEVEL Left Lumbar five-Sacral one;  Surgeon: Charlie Pitter, MD;  Location: [place] [facility];  Service: Neurosurgery;  Laterality: Left;  Left Lumbar Five-Sacral One Microdiskectomy  . VASCULAR SURGERY     varicose vein surgery    There were no vitals filed for this visit.      Subjective Assessment - 04/18/17 1109    Subjective Exercise with toes has helped the numbness in my foot.  I have been doing the exercises.  My balance is a little stronger. Did not come to PT last week , there were no openings.  I didi the  best I could at home.    Currently in Pain? Yes   Pain Score 4    Pain Location Hip   Pain Orientation Right   Pain Descriptors / Indicators Sore   Pain Type Chronic pain   Pain Radiating Towards into left knee and leg,  right leg is pretty good.   Aggravating Factors  worse in the morning   Pain Relieving Factors exercises                         [facility] Adult PT Treatment/Exercise - 04/18/17 0001      Ambulation/Gait   Pre-Gait Activities wall slides facing wall weight bearing with arm reaches 10 x each   Gait Comments Large steps and weightshifting drills in parallel bars and in clinic with cane.  trunk rotation helped balance,  longer steps helped      High Level Balance   High Level Balance Comments weight shifting,  narrowed and wider Base,  static and dynamis exercises.  Close SBA.  sitting rests were helpful with Left hip pain flar,  mild     Lumbar Exercises: Stretches   Single Knee to Chest  Stretch 10 seconds   Single Knee to Chest Stretch Limitations sitting to decrease right low back pain , sitting,  AA     Lumbar Exercises: Aerobic   Stationary Bike Nu-step L5 x 6 min UE/lE     Knee/Hip Exercises: Stretches   Gastroc Stretch 3 reps;30 seconds   Gastroc Stretch Limitations incline board     Knee/Hip Exercises: Standing   Heel Raises 10 reps   Heel Raises Limitations moderate use of hands  more forward then lift.   Hip Abduction 5 reps   Abduction Limitations holding chair, cues, SBA   SLS 5 X 15 seconds each holding   Other Standing Knee Exercises wall slides 10 x both cues SBA     Knee/Hip Exercises: Seated   Hamstring Curl 10 reps;2 sets   Hamstring Limitations yellow band     Ankle Exercises: Seated   Other Seated Ankle Exercises 4 way , yellow band,  mod assist for for technique                  PT Short Term Goals - 04/18/17 1303      PT SHORT TERM GOAL #1   Title pt will be I with inital HEP (03/29/2017)   Time 4   Period  Weeks   Status Achieved     PT SHORT TERM GOAL #2   Title pt will be able to verbalize and demo techniques to prevent and reduce L hip pain via RICE and HEP (03/29/2017)   Time 4   Period Weeks   Status Unable to assess     PT SHORT TERM GOAL #3   Title she will increase L hip flexion / extension by >/= 5 degree to promote functional mobility with </= 4/10 pain (03/29/2017)   Time 4   Period Weeks   Status Unable to assess           PT Long Term Goals - 03/08/17 1444      PT LONG TERM GOAL #1   Title pt will be I with all HEP given as of last visit (05/03/2017)   Time 8   Period Weeks   Status New     PT LONG TERM GOAL #2   Title she will increase L hip abduction/ extension to >/= 4/5 to promote safety during standing and walking (05/03/2017)   Time 8   Period Weeks   Status New     PT LONG TERM GOAL #3   Title she will be able to stand / walk >/= 20 min with LRAD with </= 2/10 for endurance required for  community and home ambulation (05/03/2017)   Time 8   Period Weeks   Status New     PT LONG TERM GOAL #4   Title she will increase her FOTO score to </= 495 limited to demonstrate improvement in function.    Time 8   Period Weeks   Status New               Plan - 04/18/17 1301    Clinical Impression Statement Pain 4.5 left hip post session.  Weight bearing and posture with gait improving.  She is using her cane less.  She needs cues for her HEP.   PT Next Visit Plan strengtheing of the hips, gait training, balance training.  Work toward goals. hip strengthening/ manual PRN. Consider adding corner balance exercises for home.  Continue ankle exercises   PT Home Exercise Plan supine marching, glute  med/ piriformis stretch, sideling abduction, bridges,  AROM ankle series,  Standing ankle strength   Consulted and Agree with Plan of Care Patient      Patient will benefit from skilled therapeutic intervention in order to improve the following deficits and  impairments:  Abnormal gait, Pain, Improper body mechanics, Postural dysfunction, Decreased strength, Decreased endurance, Decreased activity tolerance, Increased fascial restricitons, Increased muscle spasms, Decreased balance  Visit Diagnosis: Pain in left hip  Chronic bilateral low back pain, with sciatica presence unspecified  Muscle weakness (generalized)  Other abnormalities of gait and mobility  Abnormal posture     Problem List Patient Active Problem List   Diagnosis Date Noted  . Chronic left-sided low back pain with left-sided sciatica 03/27/2017  . Influenza 01/17/2017  . Influenza A 01/16/2017  . HTN (hypertension) 01/16/2017  . Microcytic anemia 01/16/2017  . Acute hyponatremia 01/16/2017  . Asthma 01/16/2017  . UTI (urinary tract infection) 01/16/2017  . Anemia   . Respiratory distress   . Neck pain 01/11/2017  . Acute pain of left shoulder 01/11/2017  . Left leg pain 01/06/2015  . Varicose veins of both lower extremities with complications 14/23/9532  . HNP (herniated nucleus pulposus), lumbar 07/05/2013    HARRIS,KAREN PTA 04/18/2017, 1:05 PM  [place], [place], [address] Phone: [phone]   Fax:  [phone]  Name: Beverly Jimenez MRN: [id number] Date of Birth: [birth year]

## 2017-04-20 ENCOUNTER — Encounter: Payer: Self-pay | Admitting: Physical Therapy

## 2017-04-20 ENCOUNTER — Ambulatory Visit: Payer: Medicare Other | Admitting: Physical Therapy

## 2017-04-20 DIAGNOSIS — G8929 Other chronic pain: Secondary | ICD-10-CM | POA: Diagnosis not present

## 2017-04-20 DIAGNOSIS — M25552 Pain in left hip: Secondary | ICD-10-CM

## 2017-04-20 DIAGNOSIS — R2689 Other abnormalities of gait and mobility: Secondary | ICD-10-CM

## 2017-04-20 DIAGNOSIS — M545 Low back pain: Secondary | ICD-10-CM | POA: Diagnosis not present

## 2017-04-20 DIAGNOSIS — M6281 Muscle weakness (generalized): Secondary | ICD-10-CM

## 2017-04-20 DIAGNOSIS — R293 Abnormal posture: Secondary | ICD-10-CM | POA: Diagnosis not present

## 2017-04-20 NOTE — Therapy (Signed)
[place], [place], [address] Phone: [phone]   Fax:  [phone]  Physical Therapy Treatment  Patient Details  Name: Beverly Jimenez MRN: [id number] Date of Birth: Nov 27, 1925 Referring Provider: Mcarthur Rossetti MD  Encounter Date: 04/20/2017      PT End of Session - 04/20/17 1142    Visit Number 10   Number of Visits 17   Date for PT Re-Evaluation 05/03/17   PT Start Time 1118  pt arrived 18 minutes late today   PT Stop Time 1143   PT Time Calculation (min) 25 min   Activity Tolerance Patient tolerated treatment well   Behavior During Therapy [facility] for tasks assessed/performed      Past Medical History:  Diagnosis Date  . Anemia   . Arthritis    shoulder and knee pain  . Asthma   . Asthma   . Depression    Hx; of situational depression  . GERD (gastroesophageal reflux disease)   . Glaucoma    Hx; of  . High cholesterol   . Myalgia   . Numbness and tingling    Hx: of B/LLE  . Osteoporosis   . Pneumonia    Hx: of "years ago"  . Shortness of breath     Past Surgical History:  Procedure Laterality Date  . APPENDECTOMY    . BACK SURGERY    . CHOLECYSTECTOMY    . COLONOSCOPY     Hx: of  . EYE SURGERY     catarac  . 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 HERNIA REPAIR    . LUMBAR LAMINECTOMY/DECOMPRESSION MICRODISCECTOMY Left 07/05/2013   Procedure: LUMBAR LAMINECTOMY/DECOMPRESSION MICRODISCECTOMY 1 LEVEL Left Lumbar five-Sacral one;  Surgeon: Charlie Pitter, MD;  Location: [place] [facility];  Service: Neurosurgery;  Laterality: Left;  Left Lumbar Five-Sacral One Microdiskectomy  . VASCULAR SURGERY     varicose vein surgery    There were no vitals filed for this visit.      Subjective Assessment - 04/20/17 1119    Subjective "I feel like I am getting, some soreness in the lateral aspect of the thigh"   Currently in Pain? Yes   Pain Score 4    Pain Location Hip   Pain  Orientation Right   Pain Type Chronic pain   Aggravating Factors  prlonged walking/ standing   Pain Relieving Factors exercise                         [facility] Adult PT Treatment/Exercise - 04/20/17 1121      Lumbar Exercises: Aerobic   Stationary Bike Nu-step L6 x 5 min  just LE ot increase endurance/ strength     Knee/Hip Exercises: Standing   Hip Abduction Stengthening;Both;1 set;20 reps;Knee straight  in //   Hip Extension Both;Stengthening;1 set;20 reps;Knee straight   Other Standing Knee Exercises marching in place 2 x 2 min  in //             Balance Exercises - 04/20/17 1146      Balance Exercises: Standing   Standing Eyes Opened Narrow base of support (BOS);4 reps;30 secs  tactile/ verbal cues to shift hips to middle of //           PT Education - 04/20/17 1142    Education provided Yes   Education Details updated HEP for standing hip abduction.    Person(s) Educated  Patient   Methods Explanation;Verbal cues;Handout   Comprehension Verbalized understanding;Verbal cues required          PT Short Term Goals - 2017/04/22 1145      PT SHORT TERM GOAL #1   Title pt will be I with inital HEP (03/29/2017)   Time 4   Period Weeks   Status Achieved     PT SHORT TERM GOAL #2   Title pt will be able to verbalize and demo techniques to prevent and reduce L hip pain via RICE and HEP (03/29/2017)   Time 4   Period Weeks   Status Achieved     PT SHORT TERM GOAL #3   Title she will increase L hip flexion / extension by >/= 5 degree to promote functional mobility with </= 4/10 pain (03/29/2017)   Time 4   Period Weeks   Status On-going           PT Long Term Goals - 03/08/17 1444      PT LONG TERM GOAL #1   Title pt will be I with all HEP given as of last visit (05/03/2017)   Time 8   Period Weeks   Status New     PT LONG TERM GOAL #2   Title she will increase L hip abduction/ extension to >/= 4/5 to promote safety during standing and  walking (05/03/2017)   Time 8   Period Weeks   Status New     PT LONG TERM GOAL #3   Title she will be able to stand / walk >/= 20 min with LRAD with </= 2/10 for endurance required for  community and home ambulation (05/03/2017)   Time 8   Period Weeks   Status New     PT LONG TERM GOAL #4   Title she will increase her FOTO score to </= 495 limited to demonstrate improvement in function.    Time 8   Period Weeks   Status New               Plan - 2017/04/22 1143    Clinical Impression Statement pt arrived 18 minutes late today. continued working on hip strengthening in standing due to pt reporting that is when her pain occurs. provided decreased rest breaks while pt remained standing to increase endurance.  she demos L lateral hip shifting which she is able to correct with cues. she declined modalities post session.    PT Next Visit Plan balance and gait training, reduce L lateral hip shift, Work toward goals. hip strengthening/ manual PRN. Consider adding corner balance exercises for home.  Continue ankle exercises   PT Home Exercise Plan supine marching, glute med/ piriformis stretch, sideling abduction, bridges,  AROM ankle series,  Standing ankle strength, standing hip abduction.    Consulted and Agree with Plan of Care Patient      Patient will benefit from skilled therapeutic intervention in order to improve the following deficits and impairments:  Abnormal gait, Pain, Improper body mechanics, Postural dysfunction, Decreased strength, Decreased endurance, Decreased activity tolerance, Increased fascial restricitons, Increased muscle spasms, Decreased balance  Visit Diagnosis: Pain in left hip  Chronic bilateral low back pain, with sciatica presence unspecified  Muscle weakness (generalized)  Other abnormalities of gait and mobility  Abnormal posture       G-Codes - 04/22/2017 1147    Functional Assessment Tool Used (Outpatient Only) FOTO/ clinical judgement    Functional Limitation Mobility: Walking and moving around   Mobility:  Walking and Moving Around Current Status [phone]) At least 40 percent but less than 60 percent impaired, limited or restricted   Mobility: Walking and Moving Around Goal Status [phone]) At least 20 percent but less than 40 percent impaired, limited or restricted      Problem List Patient Active Problem List   Diagnosis Date Noted  . Chronic left-sided low back pain with left-sided sciatica 03/27/2017  . Influenza 01/17/2017  . Influenza A 01/16/2017  . HTN (hypertension) 01/16/2017  . Microcytic anemia 01/16/2017  . Acute hyponatremia 01/16/2017  . Asthma 01/16/2017  . UTI (urinary tract infection) 01/16/2017  . Anemia   . Respiratory distress   . Neck pain 01/11/2017  . Acute pain of left shoulder 01/11/2017  . Left leg pain 01/06/2015  . Varicose veins of both lower extremities with complications 16/60/6004  . HNP (herniated nucleus pulposus), lumbar 07/05/2013   Starr Lake PT, DPT, LAT, ATC  04/20/17  11:48 AM      [place] [facility] [address] [place], [place], [address] Phone: [phone]   Fax:  [phone]  Name: Beverly Jimenez MRN: [id number] Date of Birth: 12/28/1924

## 2017-04-25 ENCOUNTER — Ambulatory Visit: Payer: Medicare Other | Admitting: Physical Therapy

## 2017-04-25 ENCOUNTER — Encounter: Payer: Self-pay | Admitting: Physical Therapy

## 2017-04-25 DIAGNOSIS — R293 Abnormal posture: Secondary | ICD-10-CM

## 2017-04-25 DIAGNOSIS — M545 Low back pain: Secondary | ICD-10-CM | POA: Diagnosis not present

## 2017-04-25 DIAGNOSIS — M6281 Muscle weakness (generalized): Secondary | ICD-10-CM | POA: Diagnosis not present

## 2017-04-25 DIAGNOSIS — R2689 Other abnormalities of gait and mobility: Secondary | ICD-10-CM

## 2017-04-25 DIAGNOSIS — M25552 Pain in left hip: Secondary | ICD-10-CM

## 2017-04-25 DIAGNOSIS — G8929 Other chronic pain: Secondary | ICD-10-CM | POA: Diagnosis not present

## 2017-04-25 NOTE — Therapy (Signed)
[place], [place], [address] Phone: [phone]   Fax:  [phone]  Physical Therapy Treatment  Patient Details  Name: Beverly Jimenez MRN: [id number] Date of Birth: 11-02-1925 Referring Provider: Mcarthur Rossetti MD  Encounter Date: 04/25/2017      PT End of Session - 04/25/17 1157    Visit Number 11   Number of Visits 17   Date for PT Re-Evaluation 05/03/17   PT Start Time 1104   PT Stop Time 1146   PT Time Calculation (min) 42 min   Activity Tolerance Patient tolerated treatment well   Behavior During Therapy [facility] for tasks assessed/performed      Past Medical History:  Diagnosis Date  . Anemia   . Arthritis    shoulder and knee pain  . Asthma   . Asthma   . Depression    Hx; of situational depression  . GERD (gastroesophageal reflux disease)   . Glaucoma    Hx; of  . High cholesterol   . Myalgia   . Numbness and tingling    Hx: of B/LLE  . Osteoporosis   . Pneumonia    Hx: of "years ago"  . Shortness of breath     Past Surgical History:  Procedure Laterality Date  . APPENDECTOMY    . BACK SURGERY    . CHOLECYSTECTOMY    . COLONOSCOPY     Hx: of  . EYE SURGERY     catarac  . 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 HERNIA REPAIR    . LUMBAR LAMINECTOMY/DECOMPRESSION MICRODISCECTOMY Left 07/05/2013   Procedure: LUMBAR LAMINECTOMY/DECOMPRESSION MICRODISCECTOMY 1 LEVEL Left Lumbar five-Sacral one;  Surgeon: Charlie Pitter, MD;  Location: [place] [facility];  Service: Neurosurgery;  Laterality: Left;  Left Lumbar Five-Sacral One Microdiskectomy  . VASCULAR SURGERY     varicose vein surgery    There were no vitals filed for this visit.      Subjective Assessment - 04/25/17 1108    Subjective No pain right now.  i have been walking better.  I even went to church in [place] .  Left leg is getting strength, Psin goes and comes in thigh.  pain in knee is much better.    Currently in Pain? Yes   Pain Score --  0 sitting,  mild walking   Pain Location Hip   Pain Orientation Right   Pain Descriptors / Indicators Sore   Pain Radiating Towards to toes on left,  brief   Pain Frequency Intermittent   Aggravating Factors  longer walking,  standing   Pain Relieving Factors exercise. sitting   Multiple Pain Sites --  left shoulder , mild, better with exercises.                         [facility] Adult PT Treatment/Exercise - 04/25/17 0001      High Level Balance   High Level Balance Comments in parallel bars 2 sets each side stepa, reverse walking, forward walking with high march,  no hands, close SBA     Lumbar Exercises: Aerobic   Stationary Bike Nu-step L6 x 8  min  just LE ot increase endurance/ strength     Knee/Hip Exercises: Standing   Hip Abduction Stengthening;Both;1 set;20 reps;Knee straight  in //.  no rest between right and left   Hip Extension Both;Stengthening;1 set;20 reps;Knee straight   Rocker Board Limitations  10 x PF/DF, 2 sets in parallel bars.  CGA to decrease hip movement.   unable to do heel raises from floor.   Other Standing Knee Exercises Marching 3 X 20 seconds  in parallel bars, no hands   Other Standing Knee Exercises Cable cross 3 LBS with row and press 10 X each  SBA, cued to avoid locking knees.                  PT Short Term Goals - 04/25/17 1307      PT SHORT TERM GOAL #1   Title pt will be I with inital HEP (03/29/2017)   Time 4   Period Weeks   Status Achieved     PT SHORT TERM GOAL #2   Title pt will be able to verbalize and demo techniques to prevent and reduce L hip pain via RICE and HEP (03/29/2017)   Time 4   Period Weeks   Status Achieved     PT SHORT TERM GOAL #3   Title she will increase L hip flexion / extension by >/= 5 degree to promote functional mobility with </= 4/10 pain (03/29/2017)   Baseline Lt hip not yet 0 in standing   Time 4   Period Weeks   Status On-going            PT Long Term Goals - 03/08/17 1444      PT LONG TERM GOAL #1   Title pt will be I with all HEP given as of last visit (05/03/2017)   Time 8   Period Weeks   Status New     PT LONG TERM GOAL #2   Title she will increase L hip abduction/ extension to >/= 4/5 to promote safety during standing and walking (05/03/2017)   Time 8   Period Weeks   Status New     PT LONG TERM GOAL #3   Title she will be able to stand / walk >/= 20 min with LRAD with </= 2/10 for endurance required for  community and home ambulation (05/03/2017)   Time 8   Period Weeks   Status New     PT LONG TERM GOAL #4   Title she will increase her FOTO score to </= 495 limited to demonstrate improvement in function.    Time 8   Period Weeks   Status New               Plan - 04/25/17 1201    Clinical Impression Statement Patient has been taking a new tonic and has noticed a surge in energy.   Patient able to stand/ walk 10 minutes in home.  Progress toward walking goals.    PT Next Visit Plan balance and gait training, reduce L lateral hip shift, Work toward goals. hip strengthening/ manual PRN.   Continue ankle exercises   PT Home Exercise Plan supine marching, glute med/ piriformis stretch, sideling abduction, bridges,  AROM ankle series,  Standing ankle strength, standing hip abduction.    Consulted and Agree with Plan of Care Patient      Patient will benefit from skilled therapeutic intervention in order to improve the following deficits and impairments:  Abnormal gait, Pain, Improper body mechanics, Postural dysfunction, Decreased strength, Decreased endurance, Decreased activity tolerance, Increased fascial restricitons, Increased muscle spasms, Decreased balance  Visit Diagnosis: Pain in left hip  Chronic bilateral low back pain, with sciatica presence unspecified  Muscle weakness (generalized)  Other abnormalities of gait and  mobility  Abnormal posture     Problem List Patient  Active Problem List   Diagnosis Date Noted  . Chronic left-sided low back pain with left-sided sciatica 03/27/2017  . Influenza 01/17/2017  . Influenza A 01/16/2017  . HTN (hypertension) 01/16/2017  . Microcytic anemia 01/16/2017  . Acute hyponatremia 01/16/2017  . Asthma 01/16/2017  . UTI (urinary tract infection) 01/16/2017  . Anemia   . Respiratory distress   . Neck pain 01/11/2017  . Acute pain of left shoulder 01/11/2017  . Left leg pain 01/06/2015  . Varicose veins of both lower extremities with complications 09/64/3838  . HNP (herniated nucleus pulposus), lumbar 07/05/2013    HARRIS,KAREN PTA 04/25/2017, 1:10 PM  [place], [place], [address] Phone: [phone]   Fax:  [phone]  Name: MIKAH ROTTINGHAUS MRN: [id number] Date of Birth: [birth year]

## 2017-04-27 ENCOUNTER — Encounter: Payer: Self-pay | Admitting: Physical Therapy

## 2017-04-27 ENCOUNTER — Ambulatory Visit: Payer: Medicare Other | Admitting: Physical Therapy

## 2017-04-27 DIAGNOSIS — R2689 Other abnormalities of gait and mobility: Secondary | ICD-10-CM

## 2017-04-27 DIAGNOSIS — R293 Abnormal posture: Secondary | ICD-10-CM | POA: Diagnosis not present

## 2017-04-27 DIAGNOSIS — G8929 Other chronic pain: Secondary | ICD-10-CM

## 2017-04-27 DIAGNOSIS — M545 Low back pain: Secondary | ICD-10-CM | POA: Diagnosis not present

## 2017-04-27 DIAGNOSIS — M25552 Pain in left hip: Secondary | ICD-10-CM

## 2017-04-27 DIAGNOSIS — M6281 Muscle weakness (generalized): Secondary | ICD-10-CM

## 2017-04-27 NOTE — Therapy (Signed)
[place], [place], [address] Phone: [phone]   Fax:  [phone]  Physical Therapy Treatment  Patient Details  Name: Beverly Jimenez MRN: [id number] Date of Birth: 01-06-1925 Referring Provider: Mcarthur Rossetti MD  Encounter Date: 04/27/2017      PT End of Session - 04/27/17 1150    Visit Number 12   Number of Visits 17   Date for PT Re-Evaluation 05/03/17   PT Start Time 1102   PT Stop Time 1145   PT Time Calculation (min) 43 min   Activity Tolerance Patient tolerated treatment well   Behavior During Therapy [facility] for tasks assessed/performed      Past Medical History:  Diagnosis Date  . Anemia   . Arthritis    shoulder and knee pain  . Asthma   . Asthma   . Depression    Hx; of situational depression  . GERD (gastroesophageal reflux disease)   . Glaucoma    Hx; of  . High cholesterol   . Myalgia   . Numbness and tingling    Hx: of B/LLE  . Osteoporosis   . Pneumonia    Hx: of "years ago"  . Shortness of breath     Past Surgical History:  Procedure Laterality Date  . APPENDECTOMY    . BACK SURGERY    . CHOLECYSTECTOMY    . COLONOSCOPY     Hx: of  . EYE SURGERY     catarac  . 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 HERNIA REPAIR    . LUMBAR LAMINECTOMY/DECOMPRESSION MICRODISCECTOMY Left 07/05/2013   Procedure: LUMBAR LAMINECTOMY/DECOMPRESSION MICRODISCECTOMY 1 LEVEL Left Lumbar five-Sacral one;  Surgeon: Charlie Pitter, MD;  Location: [place] [facility];  Service: Neurosurgery;  Laterality: Left;  Left Lumbar Five-Sacral One Microdiskectomy  . VASCULAR SURGERY     varicose vein surgery    There were no vitals filed for this visit.      Subjective Assessment - 04/27/17 1103    Subjective No pain sitting,  3.5 to 4/10 pain left hip with walking.   Currently in Pain? Yes   Pain Score 4    Pain Location Hip   Pain Orientation Right   Pain Descriptors / Indicators  Sore   Pain Type Chronic pain                         [facility] Adult PT Treatment/Exercise - 04/27/17 0001      High Level Balance   High Level Balance Comments pillow case slides 5 X 3 positions , cga- SBA each 3 directions     Lumbar Exercises: Stretches   Passive Hamstring Stretch 1 rep;60 seconds  right   Single Knee to Chest Stretch 2 reps;20 seconds   Single Knee to Chest Stretch Limitations AA, each     Lumbar Exercises: Aerobic   Stationary Bike Nu step L5-6 X 6 minutes     Lumbar Exercises: Supine   Clam 5 reps  2 sets   Clam Limitations blue band,  lateral thigh tender  2nd set post trigger point release.   Bridge 5 reps  2 sets,, cramps right hamstring     Knee/Hip Exercises: Stretches   Gastroc Stretch 3 reps;30 seconds   Gastroc Stretch Limitations incline board     Knee/Hip Exercises: Standing   Heel Raises 10 reps   Heel Raises Limitations rocker board  Functional Squat 1 set   Functional Squat Limitations 1 set with scap squeeze.  increased left knee pain.   Other Standing Knee Exercises wall slides 10 x both    improved balance with this     Manual Therapy   Soft tissue mobilization trigger point release lateral thigh 2 places     Kinesiotix   Inhibit Muscle  lower leg   Facilitate Muscle  Quads left                  PT Short Term Goals - 04/25/17 1307      PT SHORT TERM GOAL #1   Title pt will be I with inital HEP (03/29/2017)   Time 4   Period Weeks   Status Achieved     PT SHORT TERM GOAL #2   Title pt will be able to verbalize and demo techniques to prevent and reduce L hip pain via RICE and HEP (03/29/2017)   Time 4   Period Weeks   Status Achieved     PT SHORT TERM GOAL #3   Title she will increase L hip flexion / extension by >/= 5 degree to promote functional mobility with </= 4/10 pain (03/29/2017)   Baseline Lt hip not yet 0 in standing   Time 4   Period Weeks   Status On-going           PT Long  Term Goals - 03/08/17 1444      PT LONG TERM GOAL #1   Title pt will be I with all HEP given as of last visit (05/03/2017)   Time 8   Period Weeks   Status New     PT LONG TERM GOAL #2   Title she will increase L hip abduction/ extension to >/= 4/5 to promote safety during standing and walking (05/03/2017)   Time 8   Period Weeks   Status New     PT LONG TERM GOAL #3   Title she will be able to stand / walk >/= 20 min with LRAD with </= 2/10 for endurance required for  community and home ambulation (05/03/2017)   Time 8   Period Weeks   Status New     PT LONG TERM GOAL #4   Title she will increase her FOTO score to </= 495 limited to demonstrate improvement in function.    Time 8   Period Weeks   Status New               Plan - 04/27/17 1305    Clinical Impression Statement Patient continues to work hard during each of her sessions.  Pain in right hip decreased at end of session due to 2 triggerpoint releases. Trial tape left knee due to increased pain with hip hinges.     PT Treatment/Interventions ADLs/Self Care Home Management;Cryotherapy;Electrical Stimulation;Iontophoresis 4mg /ml Dexamethasone;Moist Heat;Ultrasound;Gait training;Stair training;Dry needling;Taping;Manual techniques;Passive range of motion;Patient/family education;Therapeutic activities;Therapeutic exercise;Balance training   PT Next Visit Plan balance and gait training, reduce L lateral hip shift, Work toward goals. hip strengthening/ manual PRN.   Continue ankle exercises.  assess tape,  work toward goals.   PT Home Exercise Plan supine marching, glute med/ piriformis stretch, sideling abduction, bridges,  AROM ankle series,  Standing ankle strength, standing hip abduction.    Consulted and Agree with Plan of Care Patient      Patient will benefit from skilled therapeutic intervention in order to improve the following deficits and impairments:  Abnormal gait, Pain,  Improper body mechanics, Postural  dysfunction, Decreased strength, Decreased endurance, Decreased activity tolerance, Increased fascial restricitons, Increased muscle spasms, Decreased balance  Visit Diagnosis: Pain in left hip  Chronic bilateral low back pain, with sciatica presence unspecified  Muscle weakness (generalized)  Other abnormalities of gait and mobility  Abnormal posture     Problem List Patient Active Problem List   Diagnosis Date Noted  . Chronic left-sided low back pain with left-sided sciatica 03/27/2017  . Influenza 01/17/2017  . Influenza A 01/16/2017  . HTN (hypertension) 01/16/2017  . Microcytic anemia 01/16/2017  . Acute hyponatremia 01/16/2017  . Asthma 01/16/2017  . UTI (urinary tract infection) 01/16/2017  . Anemia   . Respiratory distress   . Neck pain 01/11/2017  . Acute pain of left shoulder 01/11/2017  . Left leg pain 01/06/2015  . Varicose veins of both lower extremities with complications 57/49/3552  . HNP (herniated nucleus pulposus), lumbar 07/05/2013    Wauneta Silveria PTA 04/27/2017, 1:08 PM  [place], [place], [address] Phone: [phone]   Fax:  [phone]  Name: NEL STONEKING MRN: [id number] Date of Birth: 28-Feb-1925

## 2017-05-02 ENCOUNTER — Encounter: Payer: Self-pay | Admitting: Physical Therapy

## 2017-05-02 ENCOUNTER — Ambulatory Visit: Payer: Medicare Other | Admitting: Physical Therapy

## 2017-05-02 DIAGNOSIS — M6281 Muscle weakness (generalized): Secondary | ICD-10-CM

## 2017-05-02 DIAGNOSIS — R2689 Other abnormalities of gait and mobility: Secondary | ICD-10-CM | POA: Diagnosis not present

## 2017-05-02 DIAGNOSIS — M545 Low back pain: Secondary | ICD-10-CM

## 2017-05-02 DIAGNOSIS — M25552 Pain in left hip: Secondary | ICD-10-CM

## 2017-05-02 DIAGNOSIS — R293 Abnormal posture: Secondary | ICD-10-CM | POA: Diagnosis not present

## 2017-05-02 DIAGNOSIS — G8929 Other chronic pain: Secondary | ICD-10-CM | POA: Diagnosis not present

## 2017-05-02 NOTE — Therapy (Signed)
[place], [place], [address] Phone: [phone]   Fax:  [phone]  Physical Therapy Treatment / Discharge summary  Patient Details  Name: Beverly Jimenez MRN: [id number] Date of Birth: [birth year] Referring Provider: Mcarthur Rossetti MD  Encounter Date: 05/02/2017      PT End of Session - 05/02/17 1107    Visit Number 13   Number of Visits 17   Date for PT Re-Evaluation 05/03/17   PT Start Time 1106   PT Stop Time 1146   PT Time Calculation (min) 40 min   Activity Tolerance Patient tolerated treatment well   Behavior During Therapy Beverly Jimenez for tasks assessed/performed      Past Medical History:  Diagnosis Date  . Anemia   . Arthritis    shoulder and knee pain  . Asthma   . Asthma   . Depression    Hx; of situational depression  . GERD (gastroesophageal reflux disease)   . Glaucoma    Hx; of  . High cholesterol   . Myalgia   . Numbness and tingling    Hx: of B/LLE  . Osteoporosis   . Pneumonia    Hx: of "years ago"  . Shortness of breath     Past Surgical History:  Procedure Laterality Date  . APPENDECTOMY    . BACK SURGERY    . CHOLECYSTECTOMY    . COLONOSCOPY     Hx: of  . EYE SURGERY     catarac  . 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 HERNIA REPAIR    . LUMBAR LAMINECTOMY/DECOMPRESSION MICRODISCECTOMY Left 07/05/2013   Procedure: LUMBAR LAMINECTOMY/DECOMPRESSION MICRODISCECTOMY 1 LEVEL Left Lumbar five-Sacral one;  Surgeon: Charlie Pitter, MD;  Location: [place] [facility];  Service: Neurosurgery;  Laterality: Left;  Left Lumbar Five-Sacral One Microdiskectomy  . VASCULAR SURGERY     varicose vein surgery    There were no vitals filed for this visit.      Subjective Assessment - 05/02/17 1112    Subjective "I feel like I am getting stronger with not as much pain in the L hip. my MD gave me information and I have osteoporosis on the L and is looking to change my  medication"   Currently in Pain? Yes   Pain Score 4    Pain Orientation Left   Pain Descriptors / Indicators Sore   Pain Type Chronic pain   Pain Onset More than a month ago   Pain Frequency Intermittent   Aggravating Factors  standing and walking for long periods of time   Pain Relieving Factors exercise, sitting            [facility] PT Assessment - 05/02/17 1123      Observation/Other Assessments   Focus on Therapeutic Outcomes (FOTO)  50% limited     AROM   Right Hip Extension 10   Right Hip Flexion 101   Left Hip Extension 8   Left Hip Flexion 100     Strength   Right Hip Flexion 4/5   Right Hip Extension 3+/5   Right Hip ABduction 4-/5   Right Hip ADduction 5/5   Left Hip Flexion 4/5   Left Hip Extension 3/5   Left Hip ABduction 4-/5   Left Hip ADduction 5/5   Right Knee Flexion 4+/5   Right Knee Extension 5/5   Left Knee Flexion 4+/5   Left Knee Extension 5/5  [facility] Adult PT Treatment/Exercise - 05/02/17 1114      Lumbar Exercises: Aerobic   Stationary Bike Nu step L5 X 8 minutes LE only  working on endurance      Knee/Hip Exercises: Clinical research associate 30 seconds;2 reps     Knee/Hip Exercises: Standing   Hip Abduction Stengthening;Both;1 set;20 reps;Knee straight   Hip Extension Both;Stengthening;1 set;20 reps;Knee straight   Other Standing Knee Exercises Marching 3 X 20 seconds                PT Education - 05/02/17 1146    Education provided Yes   Education Details reviewed previously provided HEP. how to progress with strengthening and endurance with increased reps/ sets.    Person(s) Educated Patient   Methods Explanation;Verbal cues   Comprehension Verbalized understanding;Verbal cues required          PT Short Term Goals - 05/02/17 1148      PT SHORT TERM GOAL #1   Title pt will be I with inital HEP (03/29/2017)   Time 4   Period Weeks   Status Achieved     PT SHORT TERM GOAL #2    Title pt will be able to verbalize and demo techniques to prevent and reduce L hip pain via RICE and HEP (03/29/2017)   Time 4   Period Weeks   Status Achieved     PT SHORT TERM GOAL #3   Title she will increase L hip flexion / extension by >/= 5 degree to promote functional mobility with </= 4/10 pain (03/29/2017)   Time 4   Period Weeks   Status Partially Met           PT Long Term Goals - 05/02/17 1148      PT LONG TERM GOAL #1   Title pt will be I with all HEP given as of last visit (05/03/2017)   Time 8   Period Weeks   Status Achieved     PT LONG TERM GOAL #2   Title she will increase L hip abduction/ extension to >/= 4/5 to promote safety during standing and walking (05/03/2017)   Time 8   Period Weeks   Status Partially Met     PT LONG TERM GOAL #3   Title she will be able to stand / walk >/= 20 min with LRAD with </= 2/10 for endurance required for  community and home ambulation (05/03/2017)   Time 8   Period Weeks   Status Partially Met     PT LONG TERM GOAL #4   Title she will increase her FOTO score to </= 495 limited to demonstrate improvement in function.    Baseline 50% limited   Time 8   Period Weeks   Status Achieved               Plan - 05/02/17 1147    Clinical Impression Statement [name] has made great progress with physical therapy improving strength and ROM of bil hips. She continues to fatigue quickly with prolong standing/ walking activities. She has met or partially met all goals. she reports she feels confident that she is able to maintain her current level of funciton and progress it independently.    PT Next Visit Plan D/C   PT Home Exercise Plan supine marching, glute med/ piriformis stretch, sideling abduction, bridges,  AROM ankle series,  Standing ankle strength, standing hip abduction.    Consulted and Agree with Plan of Care  Patient      Patient will benefit from skilled therapeutic intervention in order to improve the  following deficits and impairments:     Visit Diagnosis: Pain in left hip  Chronic bilateral low back pain, with sciatica presence unspecified  Muscle weakness (generalized)  Other abnormalities of gait and mobility  Abnormal posture       G-Codes - 2017-05-27 24-Jan-1208    Functional Assessment Tool Used (Outpatient Only) FOTO/ clinical judgement   Functional Limitation Mobility: Walking and moving around   Mobility: Walking and Moving Around Current Status [phone]) At least 40 percent but less than 60 percent impaired, limited or restricted   Mobility: Walking and Moving Around Goal Status [phone]) At least 20 percent but less than 40 percent impaired, limited or restricted      Problem List Patient Active Problem List   Diagnosis Date Noted  . Chronic left-sided low back pain with left-sided sciatica 03/27/2017  . Influenza 01/17/2017  . Influenza A 01/16/2017  . HTN (hypertension) 01/16/2017  . Microcytic anemia 01/16/2017  . Acute hyponatremia 01/16/2017  . Asthma 01/16/2017  . UTI (urinary tract infection) 01/16/2017  . Anemia   . Respiratory distress   . Neck pain 01/11/2017  . Acute pain of left shoulder 01/11/2017  . Left leg pain 01/06/2015  . Varicose veins of both lower extremities with complications 16/12/930  . HNP (herniated nucleus pulposus), lumbar 07/05/2013   Starr Lake PT, DPT, LAT, ATC  05-27-2017  12:12 PM    [facility] [address] [place], [place], [address] Phone: [phone]   Fax:  [phone]  Name: Beverly Jimenez MRN: [id number] Date of Birth: 26-Mar-1925     PHYSICAL THERAPY DISCHARGE SUMMARY  Visits from Start of Care: 13  Current functional level related to goals / functional outcomes: See goals, FOTO 50% limited   Remaining deficits: See above assessment   Education / Equipment: HEP, theraband, balance, posture/biomechanics  Plan: Patient agrees to discharge.   Patient goals were partially met. Patient is being discharged due to being pleased with the current functional level.  ?????

## 2017-05-03 DIAGNOSIS — H401131 Primary open-angle glaucoma, bilateral, mild stage: Secondary | ICD-10-CM | POA: Diagnosis not present

## 2017-05-03 DIAGNOSIS — Z961 Presence of intraocular lens: Secondary | ICD-10-CM | POA: Diagnosis not present

## 2017-05-04 ENCOUNTER — Ambulatory Visit: Payer: Medicare Other | Admitting: Physical Therapy

## 2017-06-02 DIAGNOSIS — H0014 Chalazion left upper eyelid: Secondary | ICD-10-CM | POA: Diagnosis not present

## 2017-06-10 ENCOUNTER — Emergency Department (HOSPITAL_COMMUNITY)
Admission: EM | Admit: 2017-06-10 | Discharge: 2017-06-10 | Disposition: A | Discharge status: Home / Self Care | Payer: Medicare Other | Attending: Emergency Medicine | Admitting: Emergency Medicine

## 2017-06-10 ENCOUNTER — Encounter (HOSPITAL_COMMUNITY): Payer: Self-pay | Admitting: Emergency Medicine

## 2017-06-10 DIAGNOSIS — I10 Essential (primary) hypertension: Secondary | ICD-10-CM | POA: Insufficient documentation

## 2017-06-10 DIAGNOSIS — Z79899 Other long term (current) drug therapy: Secondary | ICD-10-CM | POA: Diagnosis not present

## 2017-06-10 DIAGNOSIS — Z7982 Long term (current) use of aspirin: Secondary | ICD-10-CM | POA: Insufficient documentation

## 2017-06-10 DIAGNOSIS — J45909 Unspecified asthma, uncomplicated: Secondary | ICD-10-CM | POA: Insufficient documentation

## 2017-06-10 DIAGNOSIS — J3489 Other specified disorders of nose and nasal sinuses: Secondary | ICD-10-CM | POA: Diagnosis not present

## 2017-06-10 NOTE — Discharge Instructions (Signed)
Please avoid nasal spray tomorrow to allow nose to heal.  Return if you have any concerns.

## 2017-06-10 NOTE — ED Provider Notes (Signed)
[facility] DEPT Provider Note   CSN: [id number] Arrival date & time: 06/10/17  2115     History   Chief Complaint Chief Complaint  Patient presents with  . nasal burning    HPI Beverly Jimenez is a 83 y.o. female.  The history is provided by the patient. No language interpreter was used.     83 year old female presenting with nasal irritation. Patient states she regularly would makes nasal spray solution to spray in her nose to help with the congestion. Today she accidentally makes KoolAid into the solution and spray her nose. Shortly after she reports burning sensation in her nose and feeling very uncomfortable. Incident happened approximately 3 hours ago. Since then her symptom is improving. She denies any associated throat swelling, chest pain, wheezing, or rash. She is able to breathe through her nose.  Past Medical History:  Diagnosis Date  . Anemia   . Arthritis    shoulder and knee pain  . Asthma   . Asthma   . Depression    Hx; of situational depression  . GERD (gastroesophageal reflux disease)   . Glaucoma    Hx; of  . High cholesterol   . Myalgia   . Numbness and tingling    Hx: of B/LLE  . Osteoporosis   . Pneumonia    Hx: of "years ago"  . Shortness of breath     Patient Active Problem List   Diagnosis Date Noted  . Chronic left-sided low back pain with left-sided sciatica 03/27/2017  . Influenza 01/17/2017  . Influenza A 01/16/2017  . HTN (hypertension) 01/16/2017  . Microcytic anemia 01/16/2017  . Acute hyponatremia 01/16/2017  . Asthma 01/16/2017  . UTI (urinary tract infection) 01/16/2017  . Anemia   . Respiratory distress   . Neck pain 01/11/2017  . Acute pain of left shoulder 01/11/2017  . Left leg pain 01/06/2015  . Varicose veins of both lower extremities with complications 58/52/7782  . HNP (herniated nucleus pulposus), lumbar 07/05/2013    Past Surgical History:  Procedure Laterality Date  . APPENDECTOMY    . BACK SURGERY      . CHOLECYSTECTOMY    . COLONOSCOPY     Hx: of  . EYE SURGERY     catarac  . 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 HERNIA REPAIR    . LUMBAR LAMINECTOMY/DECOMPRESSION MICRODISCECTOMY Left 07/05/2013   Procedure: LUMBAR LAMINECTOMY/DECOMPRESSION MICRODISCECTOMY 1 LEVEL Left Lumbar five-Sacral one;  Surgeon: Charlie Pitter, MD;  Location: [facility];  Service: Neurosurgery;  Laterality: Left;  Left Lumbar Five-Sacral One Microdiskectomy  . VASCULAR SURGERY     varicose vein surgery    OB History    No data available       Home Medications    Prior to Admission medications   Medication Sig Start Date End Date Taking? Authorizing Provider  acetaminophen (TYLENOL) 500 MG tablet Take 500-1,000 mg by mouth every 4 (four) hours as needed for mild pain, moderate pain, fever or headache.     [provider]  albuterol (PROVENTIL HFA;VENTOLIN HFA) 108 (90 Base) MCG/ACT inhaler Inhale 2 puffs into the lungs every 4 (four) hours as needed for wheezing or shortness of breath. 01/18/17   GhimireHenreitta Leber, MD  aspirin EC 81 MG tablet Take 81 mg by mouth daily.    [provider]  bimatoprost (LUMIGAN) 0.01 % SOLN Place 1 drop into both eyes at bedtime.    [provider]  Calcium Carbonate-Vit D-Min (CALTRATE PLUS PO) Take 2 tablets by mouth 2 (two) times daily.     [provider]  Cyanocobalamin (VITAMIN B-12 PO) Place 1 tablet under the tongue daily.    [provider]  esomeprazole (NEXIUM) 40 MG capsule Take 40 mg by mouth daily before breakfast.    [provider]  fish oil-omega-3 fatty acids 1000 MG capsule Take 2 g by mouth 2 (two) times daily.     [provider]  fluticasone (FLONASE) 50 MCG/ACT nasal spray Place 2 sprays into the nose at bedtime.     [provider]  Fluticasone-Salmeterol (ADVAIR) 100-50 MCG/DOSE AEPB Inhale 1 puff into the lungs 2 (two) times daily.    [provider]  guaiFENesin  (MUCINEX) 600 MG 12 hr tablet Take 1 tablet (600 mg total) by mouth 2 (two) times daily. 01/18/17   Ghimire, Henreitta Leber, MD  irbesartan (AVAPRO) 150 MG tablet Take 150 mg by mouth daily.    [provider]  niacin 500 MG tablet Take 500 mg by mouth daily with breakfast.    [provider]  Potassium 99 MG TABS Take 99 mg by mouth daily.    [provider]  sodium chloride (OCEAN) 0.65 % SOLN nasal spray Place 1 spray into both nostrils as needed for congestion.    [provider]  traMADol (ULTRAM) 50 MG tablet Take 50 mg by mouth every 6 (six) hours as needed for moderate pain or severe pain.     [provider]  vitamin C (ASCORBIC ACID) 500 MG tablet Take 500 mg by mouth daily.    [provider]  vitamin E 400 UNIT capsule Take 400 Units by mouth daily.    [provider]    Family History Family History  Problem Relation Age of Onset  . Varicose Veins Mother   . Diabetes Brother   . Heart disease Brother   . Hypertension Brother   . Varicose Veins Brother   . Hypertension Other   . Heart disease Other   . Glaucoma Other   . Diabetes Other   . Varicose Veins Sister   . Varicose Veins Daughter     Social History Social History  Substance Use Topics  . Smoking status: Never Smoker  . Smokeless tobacco: Never Used  . Alcohol use No     Allergies   Amoxicillin; Macrodantin [nitrofurantoin macrocrystal]; and Sulfa antibiotics   Review of Systems Review of Systems  Constitutional: Negative for fever.  HENT: Positive for sinus pain. Negative for nosebleeds, postnasal drip, sneezing and trouble swallowing.   Neurological: Negative for light-headedness and headaches.     Physical Exam Updated Vital Signs BP (!) 140/57 (BP Location: Right Arm)   Pulse 74   Temp 98.5 F (36.9 C) (Oral)   Resp 20   Ht 5\' 8"  (1.727 m)   Wt 81.6 kg (180 lb)   SpO2 100%   BMI 27.37 kg/m   Physical Exam  Constitutional: She  is oriented to person, place, and time. She appears well-developed and well-nourished. No distress.  HENT:  Head: Atraumatic.  Nose: Normal nares bilaterally without any edema or nosebleed. Normal throat exam  Eyes: Conjunctivae are normal.  Neck: Neck supple.  Cardiovascular: Normal rate and regular rhythm.   Pulmonary/Chest: Effort normal and breath sounds normal. She has no wheezes.  Neurological: She is alert and oriented to person, place, and time.  Skin: No rash noted.  Psychiatric: She has a normal mood and affect.  Nursing note and vitals reviewed.    ED Treatments / Results  Labs (all labs ordered are listed, but only abnormal results are displayed) Labs Reviewed - No data to display  EKG  EKG Interpretation None       Radiology No results found.  Procedures Procedures (including critical care time)  Medications Ordered in ED Medications - No data to display   Initial Impression / Assessment and Plan / ED Course  I have reviewed the triage vital signs and the nursing notes.  Pertinent labs & imaging results that were available during my care of the patient were reviewed by me and considered in my medical decision making (see chart for details).     BP (!) 141/68   Pulse 71   Temp 98.5 F (36.9 C) (Oral)   Resp 20   Ht 5\' 8"  (1.727 m)   Wt 81.6 kg (180 lb)   SpO2 100%   BMI 27.37 kg/m    Final Clinical Impressions(s) / ED Diagnoses   Final diagnoses:  Irritation of nose    New Prescriptions New Prescriptions   No medications on file    Patient accidentally mixed a Kool Aid powder in her nasal spray and use it today. She did have some burning sensation to her nostril initially but that is slowly improving. No evidence of nasal edema or airway compromise on exam. No wheezing. No signs of allergic reaction. Reassurance given. Encouraged patient to avoid nasal spray for the next day.  Return precaution discussed.   Domenic Moras, PA-C 06/11/17  9201    Duffy Bruce, MD 06/12/17 [phone]

## 2017-06-10 NOTE — ED Triage Notes (Signed)
Patient uses a netty pot. Patient thinks she accidentally mixed kool aid in it instead of the netty pot solution. Patient states that her nose is burning and feels weak.

## 2017-06-15 DIAGNOSIS — L258 Unspecified contact dermatitis due to other agents: Secondary | ICD-10-CM | POA: Diagnosis not present

## 2017-06-15 DIAGNOSIS — Z6830 Body mass index (BMI) 30.0-30.9, adult: Secondary | ICD-10-CM | POA: Diagnosis not present

## 2017-07-12 DIAGNOSIS — K219 Gastro-esophageal reflux disease without esophagitis: Secondary | ICD-10-CM | POA: Diagnosis not present

## 2017-07-25 ENCOUNTER — Other Ambulatory Visit: Payer: Self-pay | Admitting: Internal Medicine

## 2017-07-25 DIAGNOSIS — Z1231 Encounter for screening mammogram for malignant neoplasm of breast: Secondary | ICD-10-CM

## 2017-07-27 ENCOUNTER — Encounter (HOSPITAL_COMMUNITY): Payer: Self-pay

## 2017-07-27 ENCOUNTER — Emergency Department (HOSPITAL_COMMUNITY): Payer: Medicare Other

## 2017-07-27 ENCOUNTER — Emergency Department (HOSPITAL_COMMUNITY)
Admission: EM | Admit: 2017-07-27 | Discharge: 2017-07-27 | Disposition: A | Discharge status: Home / Self Care | Payer: Medicare Other | Attending: Emergency Medicine | Admitting: Emergency Medicine

## 2017-07-27 DIAGNOSIS — F329 Major depressive disorder, single episode, unspecified: Secondary | ICD-10-CM | POA: Diagnosis not present

## 2017-07-27 DIAGNOSIS — R197 Diarrhea, unspecified: Secondary | ICD-10-CM | POA: Insufficient documentation

## 2017-07-27 DIAGNOSIS — Z79899 Other long term (current) drug therapy: Secondary | ICD-10-CM | POA: Diagnosis not present

## 2017-07-27 DIAGNOSIS — Z9049 Acquired absence of other specified parts of digestive tract: Secondary | ICD-10-CM | POA: Diagnosis not present

## 2017-07-27 DIAGNOSIS — J45909 Unspecified asthma, uncomplicated: Secondary | ICD-10-CM | POA: Diagnosis not present

## 2017-07-27 DIAGNOSIS — R1010 Upper abdominal pain, unspecified: Secondary | ICD-10-CM

## 2017-07-27 DIAGNOSIS — R1013 Epigastric pain: Secondary | ICD-10-CM | POA: Diagnosis present

## 2017-07-27 DIAGNOSIS — K2950 Unspecified chronic gastritis without bleeding: Secondary | ICD-10-CM | POA: Insufficient documentation

## 2017-07-27 DIAGNOSIS — Z7982 Long term (current) use of aspirin: Secondary | ICD-10-CM | POA: Diagnosis not present

## 2017-07-27 DIAGNOSIS — K5732 Diverticulitis of large intestine without perforation or abscess without bleeding: Secondary | ICD-10-CM | POA: Insufficient documentation

## 2017-07-27 DIAGNOSIS — D649 Anemia, unspecified: Secondary | ICD-10-CM

## 2017-07-27 DIAGNOSIS — K5792 Diverticulitis of intestine, part unspecified, without perforation or abscess without bleeding: Secondary | ICD-10-CM

## 2017-07-27 DIAGNOSIS — A09 Infectious gastroenteritis and colitis, unspecified: Secondary | ICD-10-CM | POA: Diagnosis not present

## 2017-07-27 LAB — COMPREHENSIVE METABOLIC PANEL
ALBUMIN: 3.9 g/dL (ref 3.5–5.0)
ALK PHOS: 44 U/L (ref 38–126)
ALT: 17 U/L (ref 14–54)
ANION GAP: 9 (ref 5–15)
AST: 25 U/L (ref 15–41)
BUN: 11 mg/dL (ref 6–20)
CALCIUM: 10 mg/dL (ref 8.9–10.3)
CO2: 26 mmol/L (ref 22–32)
Chloride: 97 mmol/L — ABNORMAL LOW (ref 101–111)
Creatinine, Ser: 1.06 mg/dL — ABNORMAL HIGH (ref 0.44–1.00)
GFR calc Af Amer: 52 mL/min — ABNORMAL LOW (ref >60)
GFR calc non Af Amer: 45 mL/min — ABNORMAL LOW (ref >60)
GLUCOSE: 109 mg/dL — AB (ref 65–99)
POTASSIUM: 4.3 mmol/L (ref 3.5–5.1)
SODIUM: 132 mmol/L — AB (ref 135–145)
Total Bilirubin: 1.1 mg/dL (ref 0.3–1.2)
Total Protein: 7 g/dL (ref 6.5–8.1)

## 2017-07-27 LAB — CBC
HEMATOCRIT: 29.6 % — AB (ref 36.0–46.0)
HEMOGLOBIN: 9.4 g/dL — AB (ref 12.0–15.0)
MCH: 25.3 pg — AB (ref 26.0–34.0)
MCHC: 31.8 g/dL (ref 30.0–36.0)
MCV: 79.8 fL (ref 78.0–100.0)
Platelets: 255 10*3/uL (ref 150–400)
RBC: 3.71 MIL/uL — ABNORMAL LOW (ref 3.87–5.11)
RDW: 14 % (ref 11.5–15.5)
WBC: 7.6 10*3/uL (ref 4.0–10.5)

## 2017-07-27 LAB — URINALYSIS, ROUTINE W REFLEX MICROSCOPIC
BILIRUBIN URINE: NEGATIVE
Glucose, UA: NEGATIVE mg/dL
HGB URINE DIPSTICK: NEGATIVE
KETONES UR: NEGATIVE mg/dL
Leukocytes, UA: NEGATIVE
Nitrite: NEGATIVE
PH: 7 (ref 5.0–8.0)
Protein, ur: NEGATIVE mg/dL
SPECIFIC GRAVITY, URINE: 1.008 (ref 1.005–1.030)

## 2017-07-27 LAB — I-STAT TROPONIN, ED: TROPONIN I, POC: 0 ng/mL (ref 0.00–0.08)

## 2017-07-27 LAB — LIPASE, BLOOD: Lipase: 26 U/L (ref 11–51)

## 2017-07-27 MED ORDER — METRONIDAZOLE 500 MG PO TABS: 500.0000 mg | ORAL_TABLET | Freq: Three times a day (TID) | ORAL | 0 refills | Status: AC

## 2017-07-27 MED ORDER — IOPAMIDOL (ISOVUE-300) INJECTION 61%
INTRAVENOUS | Status: AC
  Administered 2017-07-27: 100 mL
  Filled 2017-07-27: qty 100

## 2017-07-27 MED ORDER — FAMOTIDINE IN NACL 20-0.9 MG/50ML-% IV SOLN
20.0000 mg | Freq: Once | INTRAVENOUS | Status: AC
  Administered 2017-07-27: 20 mg via INTRAVENOUS
  Filled 2017-07-27: qty 50

## 2017-07-27 MED ORDER — CIPROFLOXACIN HCL 500 MG PO TABS
500.0000 mg | ORAL_TABLET | Freq: Once | ORAL | Status: AC
  Administered 2017-07-27: 500 mg via ORAL
  Filled 2017-07-27: qty 1

## 2017-07-27 MED ORDER — METRONIDAZOLE 500 MG PO TABS
500.0000 mg | ORAL_TABLET | Freq: Once | ORAL | Status: AC
  Administered 2017-07-27: 500 mg via ORAL
  Filled 2017-07-27: qty 1

## 2017-07-27 MED ORDER — CIPROFLOXACIN HCL 500 MG PO TABS: 500.0000 mg | ORAL_TABLET | Freq: Two times a day (BID) | ORAL | 0 refills | Status: AC

## 2017-07-27 NOTE — ED Notes (Signed)
Pt unable to provide stool sample at this time

## 2017-07-27 NOTE — ED Triage Notes (Signed)
Pt presents for evaluation of ongoing diarrhea x 2 weeks, reports has seen primary care for same. Reports has been taking antidiarrheals with no improvement. Denies recent abx use. Pt Alert and oriented x4.

## 2017-07-27 NOTE — ED Notes (Signed)
Pt returned from ct

## 2017-07-27 NOTE — ED Notes (Signed)
Pt now endorses upper abdominal pain that has been coming and going for a couple days.

## 2017-07-27 NOTE — ED Notes (Signed)
Patient transported to CT 

## 2017-07-27 NOTE — Discharge Instructions (Signed)
Your work up today reveals that you have diverticulitis. Continue using tylenol and your home tramadol as needed for pain. Stay very well hydrated and get plenty of rest. Continue taking all of your home medications as directed. You may take additional tums/maalox/zantac as needed for additional pain relief.  Take the antibiotic as directed, starting tonight since this afternoon's doses were given to you here. Please take all of your antibiotics until finished!   You may develop abdominal discomfort or diarrhea from the antibiotic.  You may help offset this with probiotics which you can buy or get in yogurt. Do not eat  or take the probiotics until 2 hours after your antibiotic.   Follow the BRAT diet as outlined below to help with diarrhea. Avoid fatty/spicy/fried/acidic foods, avoid tea/coffee/alcohol, do not take any NSAIDs like ibuprofen/aleve/etc. Follow up with your regular doctor in 2-3 days for recheck of symptoms and ongoing management of your diverticulitis. Return to the ER for emergent changes or worsening symptoms.

## 2017-07-27 NOTE — ED Provider Notes (Signed)
[facility] DEPT Provider Note   CSN: [id number] Arrival date & time: 07/27/17  2993     History   Chief Complaint Chief Complaint  Patient presents with  . Diarrhea    HPI Beverly Jimenez is a 83 y.o. female with a PMHx of anemia, arthritis, GERD, HLD, paresthesias, chronic hyponatremia, sciatica, and HTN,with a PSHx of appendectomy and cholecystectomy as well as others listed below, who presents to the ED with complaints of epigastric abdominal pain that began about 3 weeks ago and diarrhea 2 weeks. Patient states that she was having epigastric pain so she went to her GI specialist Dr. Michail Sermon at [place] who switched her from Nexium to [facility], she states that this has helped somewhat however she continues to have pain especially at night. She describes the pain as 5/10 constant "bloated gassy feeling" in the epigastrium, nonradiating, worse at night and with twisting movements, and improved with Dexilant but with no other treatments tried prior to arrival. About 2 weeks ago she started having diarrhea. She was taking Imodium with minimal relief, so she called her PCP's office and they recommended she start taking Kaopectate which she states has helped slow down the diarrhea a little. She is still having about 4 episodes of looser than normal small-volume nonbloody diarrhea per day. She states that she had a colonoscopy about 4 years ago which she was told was normal, has not had another one since then. She mentions that she was started on tramadol in October 2017 and takes this for pain regularly in addition to Tylenol, however denies any other pain medication use. She denies any recent travel, sick contacts, suspicious food intake, alcohol use, NSAID use, or recent antibiotics. Her PCP is Dr. Brigitte Pulse. She states that she was told she could be seen by her GI doctor's office tomorrow, but she was worried that she needed to be seen sooner than that so she came today for evaluation. She denies  fevers, chills, CP, SOB, nausea/vomiting, constipation, obstipation, melena, hematochezia, rectal pain, hematuria, dysuria, myalgias, arthralgias, numbness, tingling, focal weakness, or any other complaints at this time.   The history is provided by the patient and medical records. No language interpreter was used.  Diarrhea   This is a new problem. The current episode started more than 1 week ago. The problem occurs 2 to 4 times per day. The problem has been gradually improving. Diarrhea characteristics: looser than normal. There has been no fever. Associated symptoms include abdominal pain. Pertinent negatives include no vomiting, no chills, no arthralgias and no myalgias. She has tried [employer] and anti-motility drugs for the symptoms. The treatment provided moderate relief.    Past Medical History:  Diagnosis Date  . Anemia   . Arthritis    shoulder and knee pain  . Asthma   . Asthma   . Depression    Hx; of situational depression  . GERD (gastroesophageal reflux disease)   . Glaucoma    Hx; of  . High cholesterol   . Myalgia   . Numbness and tingling    Hx: of B/LLE  . Osteoporosis   . Pneumonia    Hx: of "years ago"  . Shortness of breath     Patient Active Problem List   Diagnosis Date Noted  . Chronic left-sided low back pain with left-sided sciatica 03/27/2017  . Influenza 01/17/2017  . Influenza A 01/16/2017  . HTN (hypertension) 01/16/2017  . Microcytic anemia 01/16/2017  . Acute hyponatremia 01/16/2017  . Asthma  01/16/2017  . UTI (urinary tract infection) 01/16/2017  . Anemia   . Respiratory distress   . Neck pain 01/11/2017  . Acute pain of left shoulder 01/11/2017  . Left leg pain 01/06/2015  . Varicose veins of both lower extremities with complications 37/09/6268  . HNP (herniated nucleus pulposus), lumbar 07/05/2013    Past Surgical History:  Procedure Laterality Date  . APPENDECTOMY    . BACK SURGERY    . CHOLECYSTECTOMY    . COLONOSCOPY      Hx: of  . EYE SURGERY     catarac  . 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 HERNIA REPAIR    . LUMBAR LAMINECTOMY/DECOMPRESSION MICRODISCECTOMY Left 07/05/2013   Procedure: LUMBAR LAMINECTOMY/DECOMPRESSION MICRODISCECTOMY 1 LEVEL Left Lumbar five-Sacral one;  Surgeon: Charlie Pitter, MD;  Location: [facility];  Service: Neurosurgery;  Laterality: Left;  Left Lumbar Five-Sacral One Microdiskectomy  . VASCULAR SURGERY     varicose vein surgery    OB History    No data available       Home Medications    Prior to Admission medications   Medication Sig Start Date End Date Taking? Authorizing Provider  acetaminophen (TYLENOL) 500 MG tablet Take 500-1,000 mg by mouth every 4 (four) hours as needed for mild pain, moderate pain, fever or headache.     [provider]  albuterol (PROVENTIL HFA;VENTOLIN HFA) 108 (90 Base) MCG/ACT inhaler Inhale 2 puffs into the lungs every 4 (four) hours as needed for wheezing or shortness of breath. 01/18/17   GhimireHenreitta Leber, MD  aspirin EC 81 MG tablet Take 81 mg by mouth daily.    [provider]  bimatoprost (LUMIGAN) 0.01 % SOLN Place 1 drop into both eyes at bedtime.    [provider]  Calcium Carbonate-Vit D-Min (CALTRATE PLUS PO) Take 2 tablets by mouth 2 (two) times daily.     [provider]  Cyanocobalamin (VITAMIN B-12 PO) Place 1 tablet under the tongue daily.    [provider]  esomeprazole (NEXIUM) 40 MG capsule Take 40 mg by mouth daily before breakfast.    [provider]  fish oil-omega-3 fatty acids 1000 MG capsule Take 2 g by mouth 2 (two) times daily.     [provider]  fluticasone (FLONASE) 50 MCG/ACT nasal spray Place 2 sprays into the nose at bedtime.     [provider]  Fluticasone-Salmeterol (ADVAIR) 100-50 MCG/DOSE AEPB Inhale 1 puff into the lungs 2 (two) times daily.    [provider]  guaiFENesin (MUCINEX) 600 MG 12 hr tablet Take 1  tablet (600 mg total) by mouth 2 (two) times daily. 01/18/17   Ghimire, Henreitta Leber, MD  irbesartan (AVAPRO) 150 MG tablet Take 150 mg by mouth daily.    [provider]  niacin 500 MG tablet Take 500 mg by mouth daily with breakfast.    [provider]  Potassium 99 MG TABS Take 99 mg by mouth daily.    [provider]  sodium chloride (OCEAN) 0.65 % SOLN nasal spray Place 1 spray into both nostrils as needed for congestion.    [provider]  traMADol (ULTRAM) 50 MG tablet Take 50 mg by mouth every 6 (six) hours as needed for moderate pain or severe pain.     [provider]  vitamin C (ASCORBIC ACID) 500 MG tablet Take 500 mg by mouth daily.    [provider]  vitamin E 400 UNIT capsule Take 400 Units by mouth daily.    [provider]    Family History Family History  Problem Relation Age of Onset  . Varicose Veins Mother   . Diabetes Brother   . Heart disease Brother   . Hypertension Brother   . Varicose Veins Brother   . Hypertension Other   . Heart disease Other   . Glaucoma Other   . Diabetes Other   . Varicose Veins Sister   . Varicose Veins Daughter     Social History Social History  Substance Use Topics  . Smoking status: Never Smoker  . Smokeless tobacco: Never Used  . Alcohol use No     Allergies   Amoxicillin; Macrodantin [nitrofurantoin macrocrystal]; and Sulfa antibiotics   Review of Systems Review of Systems  Constitutional: Negative for chills and fever.  Respiratory: Negative for shortness of breath.   Cardiovascular: Negative for chest pain.  Gastrointestinal: Positive for abdominal pain and diarrhea. Negative for blood in stool, constipation, nausea, rectal pain and vomiting.  Genitourinary: Negative for dysuria and hematuria.  Musculoskeletal: Negative for arthralgias and myalgias.  Skin: Negative for color change.  Allergic/Immunologic: Negative for immunocompromised state.    Neurological: Negative for weakness and numbness.  Psychiatric/Behavioral: Negative for confusion.   All other systems reviewed and are negative for acute change except as noted in the HPI.    Physical Exam Updated Vital Signs BP 136/71 (BP Location: Right Arm)   Pulse 74   Temp 97.9 F (36.6 C)   Resp 20   SpO2 98%   Physical Exam  Constitutional: She is oriented to person, place, and time. Vital signs are normal. She appears well-developed and well-nourished.  Non-toxic appearance. No distress.  Afebrile, nontoxic, NAD  HENT:  Head: Normocephalic and atraumatic.  Mouth/Throat: Oropharynx is clear and moist and mucous membranes are normal.  Eyes: Conjunctivae and EOM are normal. Right eye exhibits no discharge. Left eye exhibits no discharge.  Neck: Normal range of motion. Neck supple.  Cardiovascular: Normal rate, regular rhythm, normal heart sounds and intact distal pulses.  Exam reveals no gallop and no friction rub.   No murmur heard. Pulmonary/Chest: Effort normal and breath sounds normal. No respiratory distress. She has no decreased breath sounds. She has no wheezes. She has no rhonchi. She has no rales.  Abdominal: Soft. Normal appearance and bowel sounds are normal. She exhibits no distension. There is tenderness in the right upper quadrant and epigastric area. There is no rigidity, no rebound, no guarding, no CVA tenderness, no tenderness at McBurney's point and negative Murphy's sign.    Soft, obese but nondistended, +BS throughout, with moderate TTP in the epigastric region and R lateral/upper abdomen, no r/g/r, neg murphy's, neg mcburney's, no CVA TTP   Musculoskeletal: Normal range of motion.  Neurological: She is alert and oriented to person, place, and time. She has normal strength. No sensory deficit.  Skin: Skin is warm, dry and intact. No rash noted.  Psychiatric: She has a normal mood and affect.  Nursing note and vitals reviewed.    ED Treatments / Results   Labs (all labs ordered are listed, but only abnormal results are displayed) Labs Reviewed  COMPREHENSIVE METABOLIC PANEL - Abnormal; Notable for the following:       Result Value   Sodium 132 (*)    Chloride 97 (*)    Glucose, Bld 109 (*)    Creatinine, Ser 1.06 (*)    GFR calc  non Af Amer 45 (*)    GFR calc Af Amer 52 (*)    All other components within normal limits  CBC - Abnormal; Notable for the following:    RBC 3.71 (*)    Hemoglobin 9.4 (*)    HCT 29.6 (*)    MCH 25.3 (*)    All other components within normal limits  GASTROINTESTINAL PANEL BY PCR, STOOL (REPLACES STOOL CULTURE)  C DIFFICILE QUICK SCREEN W PCR REFLEX  LIPASE, BLOOD  URINALYSIS, ROUTINE W REFLEX MICROSCOPIC  I-STAT TROPONIN, ED  POC OCCULT BLOOD, ED    EKG  EKG Interpretation  Date/Time:  Thursday July 27 2017 12:58:30 EDT Ventricular Rate:  84 PR Interval:    QRS Duration: 73 QT Interval:  340 QTC Calculation: 405 R Axis:   48 Text Interpretation:  Sinus rhythm Abnormal R-wave progression, early transition Borderline repolarization abnormality Minimal ST elevation, inferior leads Baseline wander in lead(s) III Confirmed by Davonna Belling [phone]) on 07/27/2017 3:34:56 PM       Radiology Ct Abdomen Pelvis W Contrast  Result Date: 07/27/2017 CLINICAL DATA:  Abdominal pain. EXAM: CT ABDOMEN AND PELVIS WITH CONTRAST TECHNIQUE: Multidetector CT imaging of the abdomen and pelvis was performed using the standard protocol following bolus administration of intravenous contrast. CONTRAST:  133mL ISOVUE-300 IOPAMIDOL (ISOVUE-300) INJECTION 61% COMPARISON:  September 26, 2014 FINDINGS: Lower chest: No acute abnormality. Hepatobiliary: The patient is status post cholecystectomy. Mild intra and extrahepatic biliary duct dilatation is likely due to previous cholecystectomy. Hepatic steatosis is identified. No focal liver masses are noted. The portal vein is patent. Pancreas: Unremarkable. No pancreatic ductal  dilatation or surrounding inflammatory changes. Spleen: Normal in size without focal abnormality. Adrenals/Urinary Tract: Adrenal glands are unremarkable. Kidneys are normal, without renal calculi, focal lesion, or hydronephrosis. Bladder is unremarkable. Stomach/Bowel: The stomach and small bowel are normal. Focal stranding adjacent to the junction of the descending colon and sigmoid colon on series 3, image 54 is consistent with mild diverticulitis. Other scattered diverticuli are identified with no other colonic abnormalities identified. The appendix is not visualized but there is no secondary evidence of appendicitis. Vascular/Lymphatic: Atherosclerotic changes seen in the non aneurysmal aorta. No adenopathy. Reproductive: Uterus and bilateral adnexa are unremarkable. Other: No free air or free fluid. Musculoskeletal: Pedicle rods and screws are seen in the lower lumbar spine. No acute bony abnormalities. IMPRESSION: 1. Mild diverticulitis at the junction of the descending and sigmoid colon, likely explaining the patient's symptoms. 2. Atherosclerotic change in the non aneurysmal aorta. 3. No other acute abnormalities. Aortic Atherosclerosis (ICD10-I70.0). Electronically Signed   By: Dorise Bullion III M.D   On: 07/27/2017 14:12    Procedures Procedures (including critical care time)  Medications Ordered in ED Medications  famotidine (PEPCID) IVPB 20 mg premix (0 mg Intravenous Stopped 07/27/17 1333)  iopamidol (ISOVUE-300) 61 % injection (100 mLs  Contrast Given 07/27/17 1342)  ciprofloxacin (CIPRO) tablet 500 mg (500 mg Oral Given 07/27/17 1557)  metroNIDAZOLE (FLAGYL) tablet 500 mg (500 mg Oral Given 07/27/17 1557)     Initial Impression / Assessment and Plan / ED Course  I have reviewed the triage vital signs and the nursing notes.  Pertinent labs & imaging results that were available during my care of the patient were reviewed by me and considered in my medical decision making (see chart for  details).     83 y.o. female here with epigastric pain x2-3wks, was seen by her GI specialist Dr. Michail Sermon at [facility]  and started on dexilant, and this has helped but now she's having diarrhea x2wks. Was taking imodium with minimal relief, told to start kaopectate which has helped some. Has appt with GI tomorrow but felt she needed to be seen today for further eval. On exam, moderate R lateral abd and epigastrium, nonperitoneal, adequate bowel sounds throughout, without mcburney's or murphy's exam findings. Labs done in triage show: CBC with chronic stable anemia, no leukocytosis; CMP with stable hyponatremia at 132, Cr slightly higher than prior (1.06 today) but otherwise overall unremarkable; lipase WNL. Will get U/A, FOBT on stool sample if provided, and send stool samples; will get EKG and troponin as well; and will get CT abd/pelv to eval for diverticulitis vs colitis vs obstruction, etc. Pt declines wanting anything for symptoms, will give pepcid but hold off on GI cocktail until we find out results of CT scan. Will reassess shortly  4:25 PM U/A unremarkable. Trop neg. EKG without acute findings, similar to prior EKGs. CT abd/pelv demonstrates diverticulitis at junction of descending and sigmoid colon, which would likely be the cause of her symptoms. First dose of cipro/flagyl given here, pt tolerated without issue. Unable to provide stool sample here, given that we have a clear answer, will cancel these. Will treat for diverticulitis.  Advised use of home pain meds for pain, OTC remedies for symptomatic relief, avoidance of NSAIDs, diet/lifestyle modifications for gastritis/GERD, BRAT diet, adequate hydration, and f/up with PCP in 2-3 days for recheck and ongoing management. Discussed case with my attending Dr. Alvino Chapel who agrees with plan. I explained the diagnosis and have given explicit precautions to return to the ER including for any other new or worsening symptoms. The patient understands and  accepts the medical plan as it's been dictated and I have answered their questions. Discharge instructions concerning home care and prescriptions have been given. The patient is STABLE and is discharged to home in good condition.    Final Clinical Impressions(s) / ED Diagnoses   Final diagnoses:  Upper abdominal pain  Diarrhea of presumed infectious origin  Diverticulitis  Chronic anemia  Chronic gastritis, presence of bleeding unspecified, unspecified gastritis type    New Prescriptions New Prescriptions   CIPROFLOXACIN (CIPRO) 500 MG TABLET    Take 1 tablet (500 mg total) by mouth 2 (two) times daily. x7 days   METRONIDAZOLE (FLAGYL) 500 MG TABLET    Take 1 tablet (500 mg total) by mouth 3 (three) times daily. x 7 days     [address], [place], [place] 07/27/17 1625    Davonna Belling, MD 07/29/17 [phone]

## 2017-07-28 DIAGNOSIS — R197 Diarrhea, unspecified: Secondary | ICD-10-CM | POA: Diagnosis not present

## 2017-07-28 DIAGNOSIS — K5792 Diverticulitis of intestine, part unspecified, without perforation or abscess without bleeding: Secondary | ICD-10-CM | POA: Diagnosis not present

## 2017-07-28 DIAGNOSIS — K219 Gastro-esophageal reflux disease without esophagitis: Secondary | ICD-10-CM | POA: Diagnosis not present

## 2017-07-28 DIAGNOSIS — R1013 Epigastric pain: Secondary | ICD-10-CM | POA: Diagnosis not present

## 2017-08-01 DIAGNOSIS — K5792 Diverticulitis of intestine, part unspecified, without perforation or abscess without bleeding: Secondary | ICD-10-CM | POA: Diagnosis not present

## 2017-08-08 DIAGNOSIS — K5792 Diverticulitis of intestine, part unspecified, without perforation or abscess without bleeding: Secondary | ICD-10-CM | POA: Diagnosis not present

## 2017-08-10 DIAGNOSIS — Z974 Presence of external hearing-aid: Secondary | ICD-10-CM | POA: Diagnosis not present

## 2017-08-10 DIAGNOSIS — R0982 Postnasal drip: Secondary | ICD-10-CM | POA: Diagnosis not present

## 2017-08-10 DIAGNOSIS — M961 Postlaminectomy syndrome, not elsewhere classified: Secondary | ICD-10-CM | POA: Diagnosis not present

## 2017-08-10 DIAGNOSIS — H6122 Impacted cerumen, left ear: Secondary | ICD-10-CM | POA: Diagnosis not present

## 2017-08-10 DIAGNOSIS — M48062 Spinal stenosis, lumbar region with neurogenic claudication: Secondary | ICD-10-CM | POA: Diagnosis not present

## 2017-08-10 DIAGNOSIS — M5416 Radiculopathy, lumbar region: Secondary | ICD-10-CM | POA: Diagnosis not present

## 2017-08-10 DIAGNOSIS — H9212 Otorrhea, left ear: Secondary | ICD-10-CM | POA: Diagnosis not present

## 2017-08-10 DIAGNOSIS — I10 Essential (primary) hypertension: Secondary | ICD-10-CM | POA: Diagnosis not present

## 2017-08-10 DIAGNOSIS — Z6829 Body mass index (BMI) 29.0-29.9, adult: Secondary | ICD-10-CM | POA: Diagnosis not present

## 2017-08-10 DIAGNOSIS — H9193 Unspecified hearing loss, bilateral: Secondary | ICD-10-CM | POA: Diagnosis not present

## 2017-08-25 DIAGNOSIS — D649 Anemia, unspecified: Secondary | ICD-10-CM | POA: Diagnosis not present

## 2017-08-25 DIAGNOSIS — K5792 Diverticulitis of intestine, part unspecified, without perforation or abscess without bleeding: Secondary | ICD-10-CM | POA: Diagnosis not present

## 2017-08-25 DIAGNOSIS — R197 Diarrhea, unspecified: Secondary | ICD-10-CM | POA: Diagnosis not present

## 2017-08-28 ENCOUNTER — Ambulatory Visit
Admission: RE | Admit: 2017-08-28 | Discharge: 2017-08-28 | Disposition: A | Discharge status: Home / Self Care | Payer: Medicare Other | Source: Ambulatory Visit | Attending: Internal Medicine | Admitting: Internal Medicine

## 2017-08-28 DIAGNOSIS — Z1231 Encounter for screening mammogram for malignant neoplasm of breast: Secondary | ICD-10-CM | POA: Diagnosis not present

## 2017-08-29 ENCOUNTER — Other Ambulatory Visit: Payer: Self-pay | Admitting: Gastroenterology

## 2017-08-29 DIAGNOSIS — K644 Residual hemorrhoidal skin tags: Secondary | ICD-10-CM | POA: Diagnosis not present

## 2017-08-29 DIAGNOSIS — K625 Hemorrhage of anus and rectum: Secondary | ICD-10-CM | POA: Diagnosis not present

## 2017-08-29 DIAGNOSIS — K6289 Other specified diseases of anus and rectum: Secondary | ICD-10-CM | POA: Diagnosis not present

## 2017-09-04 DIAGNOSIS — H401131 Primary open-angle glaucoma, bilateral, mild stage: Secondary | ICD-10-CM | POA: Diagnosis not present

## 2017-09-04 DIAGNOSIS — Z961 Presence of intraocular lens: Secondary | ICD-10-CM | POA: Diagnosis not present

## 2017-09-06 DIAGNOSIS — M48062 Spinal stenosis, lumbar region with neurogenic claudication: Secondary | ICD-10-CM | POA: Diagnosis not present

## 2017-09-06 DIAGNOSIS — M5416 Radiculopathy, lumbar region: Secondary | ICD-10-CM | POA: Diagnosis not present

## 2017-09-06 DIAGNOSIS — M961 Postlaminectomy syndrome, not elsewhere classified: Secondary | ICD-10-CM | POA: Diagnosis not present

## 2017-09-12 ENCOUNTER — Other Ambulatory Visit: Payer: Self-pay | Admitting: Gastroenterology

## 2017-09-15 ENCOUNTER — Encounter (HOSPITAL_COMMUNITY): Payer: Self-pay

## 2017-09-15 NOTE — Progress Notes (Signed)
Spoke to patient and gave pre-op orders for colonoscopy. Gave instructions on what medications to take including albuterol, nexium, dexilant, flonase, advair, tramadol, tylenol, irbesartan, and mucinex. Told pt to discuss with GI doctor about taking aspirin.   Chest xray done 01/25/17 EKG done 07/27/17

## 2017-09-18 ENCOUNTER — Ambulatory Visit (HOSPITAL_COMMUNITY): Payer: Medicare Other | Admitting: Certified Registered Nurse Anesthetist

## 2017-09-18 ENCOUNTER — Ambulatory Visit (HOSPITAL_COMMUNITY)
Admission: RE | Admit: 2017-09-18 | Discharge: 2017-09-18 | Disposition: A | Discharge status: Home / Self Care | Payer: Medicare Other | Source: Ambulatory Visit | Attending: Gastroenterology | Admitting: Gastroenterology

## 2017-09-18 ENCOUNTER — Encounter (HOSPITAL_COMMUNITY)
Admission: RE | Disposition: A | Discharge status: Home / Self Care | Payer: Self-pay | Source: Ambulatory Visit | Attending: Gastroenterology

## 2017-09-18 ENCOUNTER — Encounter (HOSPITAL_COMMUNITY): Payer: Self-pay | Admitting: *Deleted

## 2017-09-18 DIAGNOSIS — K640 First degree hemorrhoids: Secondary | ICD-10-CM | POA: Diagnosis not present

## 2017-09-18 DIAGNOSIS — K6289 Other specified diseases of anus and rectum: Secondary | ICD-10-CM | POA: Insufficient documentation

## 2017-09-18 DIAGNOSIS — I10 Essential (primary) hypertension: Secondary | ICD-10-CM | POA: Diagnosis not present

## 2017-09-18 DIAGNOSIS — E7800 Pure hypercholesterolemia, unspecified: Secondary | ICD-10-CM | POA: Insufficient documentation

## 2017-09-18 DIAGNOSIS — K644 Residual hemorrhoidal skin tags: Secondary | ICD-10-CM | POA: Diagnosis not present

## 2017-09-18 DIAGNOSIS — K921 Melena: Secondary | ICD-10-CM | POA: Diagnosis not present

## 2017-09-18 DIAGNOSIS — K5730 Diverticulosis of large intestine without perforation or abscess without bleeding: Secondary | ICD-10-CM | POA: Diagnosis not present

## 2017-09-18 DIAGNOSIS — K922 Gastrointestinal hemorrhage, unspecified: Secondary | ICD-10-CM | POA: Diagnosis not present

## 2017-09-18 DIAGNOSIS — J45909 Unspecified asthma, uncomplicated: Secondary | ICD-10-CM | POA: Diagnosis not present

## 2017-09-18 DIAGNOSIS — R197 Diarrhea, unspecified: Secondary | ICD-10-CM | POA: Diagnosis present

## 2017-09-18 DIAGNOSIS — Q439 Congenital malformation of intestine, unspecified: Secondary | ICD-10-CM | POA: Insufficient documentation

## 2017-09-18 DIAGNOSIS — Z79899 Other long term (current) drug therapy: Secondary | ICD-10-CM | POA: Diagnosis not present

## 2017-09-18 DIAGNOSIS — K219 Gastro-esophageal reflux disease without esophagitis: Secondary | ICD-10-CM | POA: Insufficient documentation

## 2017-09-18 DIAGNOSIS — Z7951 Long term (current) use of inhaled steroids: Secondary | ICD-10-CM | POA: Diagnosis not present

## 2017-09-18 DIAGNOSIS — I739 Peripheral vascular disease, unspecified: Secondary | ICD-10-CM | POA: Insufficient documentation

## 2017-09-18 DIAGNOSIS — Z7982 Long term (current) use of aspirin: Secondary | ICD-10-CM | POA: Insufficient documentation

## 2017-09-18 DIAGNOSIS — K625 Hemorrhage of anus and rectum: Secondary | ICD-10-CM | POA: Diagnosis not present

## 2017-09-18 DIAGNOSIS — K5731 Diverticulosis of large intestine without perforation or abscess with bleeding: Secondary | ICD-10-CM | POA: Diagnosis not present

## 2017-09-18 HISTORY — PX: COLONOSCOPY WITH PROPOFOL: SHX5780

## 2017-09-18 HISTORY — DX: Anxiety disorder, unspecified: F41.9

## 2017-09-18 HISTORY — DX: Constipation, unspecified: K59.00

## 2017-09-18 HISTORY — DX: Personal history of other diseases of the digestive system: Z87.19

## 2017-09-18 SURGERY — COLONOSCOPY WITH PROPOFOL
Anesthesia: Monitor Anesthesia Care

## 2017-09-18 MED ORDER — PROPOFOL 10 MG/ML IV BOLUS
INTRAVENOUS | Status: DC | PRN
  Administered 2017-09-18: 20 mg via INTRAVENOUS
  Administered 2017-09-18: 10 mg via INTRAVENOUS

## 2017-09-18 MED ORDER — PROPOFOL 500 MG/50ML IV EMUL
INTRAVENOUS | Status: DC | PRN
  Administered 2017-09-18: 50 ug/kg/min via INTRAVENOUS

## 2017-09-18 MED ORDER — SODIUM CHLORIDE 0.9 % IV SOLN: INTRAVENOUS | Status: DC

## 2017-09-18 MED ORDER — LIDOCAINE 2% (20 MG/ML) 5 ML SYRINGE
INTRAMUSCULAR | Status: DC | PRN
  Administered 2017-09-18: 60 mg via INTRAVENOUS

## 2017-09-18 MED ORDER — LACTATED RINGERS IV SOLN
INTRAVENOUS | Status: AC | PRN
  Administered 2017-09-18: 1000 mL via INTRAVENOUS
  Administered 2017-09-18: 08:00:00 via INTRAVENOUS

## 2017-09-18 MED ORDER — ACETAMINOPHEN 500 MG PO TABS: 500.0000 mg | ORAL_TABLET | Freq: Three times a day (TID) | ORAL | 0 refills | Status: AC | PRN

## 2017-09-18 SURGICAL SUPPLY — 21 items

## 2017-09-18 NOTE — Discharge Instructions (Signed)

## 2017-09-18 NOTE — Op Note (Signed)
[facility] Patient Name: Beverly Jimenez Procedure Date : 09/18/2017 MRN: [id number] Attending MD: Lear Ng , MD Date of Birth: [birth year] CSN: [id number] Age: 83 Admit Type: Outpatient Procedure:                Colonoscopy Indications:              Evaluation of unexplained GI bleeding,                            Hematochezia, Rectal pain Providers:                Lear Ng, MD, Cleda Daub, RN,                            William Dalton, Technician Referring MD:             Marton Redwood Medicines:                Propofol per Anesthesia, Monitored Anesthesia Care Complications:            No immediate complications. Estimated Blood Loss:     Estimated blood loss: none. Procedure:                Pre-Anesthesia Assessment:                           - Prior to the procedure, a History and Physical                            was performed, and patient medications and                            allergies were reviewed. The patient's tolerance of                            previous anesthesia was also reviewed. The risks                            and benefits of the procedure and the sedation                            options and risks were discussed with the patient.                            All questions were answered, and informed consent                            was obtained. Prior Anticoagulants: The patient has                            taken no previous anticoagulant or antiplatelet                            agents. ASA Grade Assessment: III - A patient with  severe systemic disease. After reviewing the risks                            and benefits, the patient was deemed in                            satisfactory condition to undergo the procedure.                           After obtaining informed consent, the colonoscope                            was passed under direct vision. Throughout the                             procedure, the patient's blood pressure, pulse, and                            oxygen saturations were monitored continuously. The                            EC-[id number] ([id number]) scope was introduced through                            the anus and advanced to the the cecum, identified                            by appendiceal orifice and ileocecal valve. The                            colonoscopy was performed with difficulty due to a                            tortuous colon. Successful completion of the                            procedure was aided by straightening and shortening                            the scope to obtain bowel loop reduction. The                            patient tolerated the procedure fairly well. The                            quality of the bowel preparation was fair and fair                            but repeated irrigation led to a good and adequate                            prep. The ileocecal valve, appendiceal orifice, and  rectum were photographed. Scope In: 9:11:06 AM Scope Out: 9:25:19 AM Scope Withdrawal Time: 0 hours 6 minutes 56 seconds  Total Procedure Duration: 0 hours 14 minutes 13 seconds  Findings:      Multiple small and large-mouthed diverticula were found in the sigmoid       colon and descending colon.      Internal hemorrhoids were found during retroflexion. The hemorrhoids       were small and Grade I (internal hemorrhoids that do not prolapse).      The perianal exam findings include non-thrombosed external hemorrhoids       and internal hemorrhoids (Grade I). Impression:               - Preparation of the colon was fair.                           - Diverticulosis in the sigmoid colon and in the                            descending colon.                           - Internal hemorrhoids.                           - Non-thrombosed external hemorrhoids and internal                             hemorrhoids (Grade I) found on perianal exam.                           - No specimens collected. Moderate Sedation:      N/A - MAC procedure Recommendation:           - High fiber diet.                           - Continue present medications.                           - Repeat colonoscopy is not recommended for                            screening purposes.                           - ProctoFoam-HC: Apply externally QID PRN.                           - Return to my office PRN.                           - Patient has a contact number available for                            emergencies. The signs and symptoms of potential  delayed complications were discussed with the                            patient. Return to normal activities tomorrow.                            Written discharge instructions were provided to the                            patient. Procedure Code(s):        --- Professional ---                           [phone], Colonoscopy, flexible; diagnostic, including                            collection of specimen(s) by brushing or washing,                            when performed (separate procedure) Diagnosis Code(s):        --- Professional ---                           K92.2, Gastrointestinal hemorrhage, unspecified                           K92.1, Melena (includes Hematochezia)                           K64.0, First degree hemorrhoids                           K64.4, Residual hemorrhoidal skin tags                           K62.89, Other specified diseases of anus and rectum                           K57.30, Diverticulosis of large intestine without                            perforation or abscess without bleeding CPT copyright 2016 [employer] Medical Association. All rights reserved. The codes documented in this report are preliminary and upon coder review may  be revised to meet current compliance requirements. Lear Ng,  MD 09/18/2017 9:36:55 AM This report has been signed electronically. Number of Addenda: 0

## 2017-09-18 NOTE — H&P (Signed)
Date of Initial H&P: 08/29/17  History reviewed, patient examined, no change in status, stable for surgery.

## 2017-09-18 NOTE — Interval H&P Note (Signed)
History and Physical Interval Note:  09/18/2017 8:52 AM  Beverly Jimenez  has presented today for surgery, with the diagnosis of Rectal bleeding  The various methods of treatment have been discussed with the patient and family. After consideration of risks, benefits and other options for treatment, the patient has consented to  Procedure(s): COLONOSCOPY WITH PROPOFOL (N/A) as a surgical intervention .  The patient's history has been reviewed, patient examined, no change in status, stable for surgery.  I have reviewed the patient's chart and labs.  Questions were answered to the patient's satisfaction.     [place] C.

## 2017-09-18 NOTE — Transfer of Care (Signed)
Immediate Anesthesia Transfer of Care Note  Patient: Beverly Jimenez  Procedure(s) Performed: COLONOSCOPY WITH PROPOFOL (N/A )  Patient Location: Endoscopy Unit  Anesthesia Type:MAC  Level of Consciousness: awake, alert , oriented and patient cooperative  Airway & Oxygen Therapy: Patient Spontanous Breathing and Patient connected to nasal cannula oxygen  Post-op Assessment: Report given to RN, Post -op Vital signs reviewed and stable and Patient moving all extremities X 4  Post vital signs: Reviewed and stable  Last Vitals:  Vitals:   09/18/17 0748  BP: (!) 154/49  Resp: 14  Temp: 36.7 C  SpO2: 100%    Last Pain:  Vitals:   09/18/17 0748  TempSrc: Oral         Complications: No apparent anesthesia complications

## 2017-09-18 NOTE — Anesthesia Postprocedure Evaluation (Signed)
Anesthesia Post Note  Patient: Beverly Jimenez  Procedure(s) Performed: COLONOSCOPY WITH PROPOFOL (N/A )     Patient location during evaluation: Endoscopy Anesthesia Type: MAC Level of consciousness: awake and alert Pain management: pain level controlled Vital Signs Assessment: post-procedure vital signs reviewed and stable Respiratory status: spontaneous breathing, nonlabored ventilation, respiratory function stable and patient connected to nasal cannula oxygen Cardiovascular status: stable and blood pressure returned to baseline Postop Assessment: no apparent nausea or vomiting Anesthetic complications: no    Last Vitals:  Vitals:   09/18/17 0955 09/18/17 1000  BP:    Pulse:    Resp:    Temp:    SpO2: 98% 100%    Last Pain:  Vitals:   09/18/17 0932  TempSrc: Oral                 Oree Hislop,JAMES TERRILL

## 2017-09-18 NOTE — Anesthesia Preprocedure Evaluation (Addendum)
Anesthesia Evaluation  Patient identified by MRN, date of birth, ID band Patient awake    Reviewed: Allergy & Precautions, NPO status , Patient's Chart, lab work & pertinent test results  Airway Mallampati: II  TM Distance: >3 FB Neck ROM: Full    Dental  (+) Edentulous Upper, Edentulous Lower   Pulmonary shortness of breath, asthma ,    breath sounds clear to auscultation       Cardiovascular hypertension, + Peripheral Vascular Disease   Rhythm:Regular Rate:Normal     Neuro/Psych  Neuromuscular disease    GI/Hepatic hiatal hernia, GERD  ,  Endo/Other    Renal/GU      Musculoskeletal  (+) Arthritis ,   Abdominal   Peds  Hematology  (+) anemia ,   Anesthesia Other Findings   Reproductive/Obstetrics                            Anesthesia Physical Anesthesia Plan  ASA: III  Anesthesia Plan: MAC   Post-op Pain Management:    Induction: Intravenous  PONV Risk Score and Plan: 2 and Dexamethasone and Treatment may vary due to age or medical condition  Airway Management Planned: Natural Airway and Simple Face Mask  Additional Equipment:   Intra-op Plan:   Post-operative Plan:   Informed Consent: I have reviewed the patients History and Physical, chart, labs and discussed the procedure including the risks, benefits and alternatives for the proposed anesthesia with the patient or authorized representative who has indicated his/her understanding and acceptance.   Dental advisory given  Plan Discussed with: CRNA  Anesthesia Plan Comments:         Anesthesia Quick Evaluation

## 2017-10-12 DIAGNOSIS — Z6829 Body mass index (BMI) 29.0-29.9, adult: Secondary | ICD-10-CM | POA: Diagnosis not present

## 2017-10-12 DIAGNOSIS — J45909 Unspecified asthma, uncomplicated: Secondary | ICD-10-CM | POA: Diagnosis not present

## 2017-10-12 DIAGNOSIS — K648 Other hemorrhoids: Secondary | ICD-10-CM | POA: Diagnosis not present

## 2017-10-12 DIAGNOSIS — D6489 Other specified anemias: Secondary | ICD-10-CM | POA: Diagnosis not present

## 2017-10-12 DIAGNOSIS — Z23 Encounter for immunization: Secondary | ICD-10-CM | POA: Diagnosis not present

## 2017-10-12 DIAGNOSIS — M810 Age-related osteoporosis without current pathological fracture: Secondary | ICD-10-CM | POA: Diagnosis not present

## 2017-10-25 ENCOUNTER — Ambulatory Visit (INDEPENDENT_AMBULATORY_CARE_PROVIDER_SITE_OTHER): Payer: Medicare Other

## 2017-10-25 ENCOUNTER — Ambulatory Visit (INDEPENDENT_AMBULATORY_CARE_PROVIDER_SITE_OTHER): Payer: Medicare Other | Admitting: Orthopaedic Surgery

## 2017-10-25 ENCOUNTER — Encounter (INDEPENDENT_AMBULATORY_CARE_PROVIDER_SITE_OTHER): Payer: Self-pay | Admitting: Orthopaedic Surgery

## 2017-10-25 DIAGNOSIS — M25512 Pain in left shoulder: Secondary | ICD-10-CM | POA: Diagnosis not present

## 2017-10-25 DIAGNOSIS — M25562 Pain in left knee: Secondary | ICD-10-CM | POA: Diagnosis not present

## 2017-10-25 MED ORDER — METHYLPREDNISOLONE ACETATE 40 MG/ML IJ SUSP
40.0000 mg | INTRAMUSCULAR | Status: AC | PRN
  Administered 2017-10-25: 40 mg via INTRA_ARTICULAR

## 2017-10-25 MED ORDER — LIDOCAINE HCL 1 % IJ SOLN
3.0000 mL | INTRAMUSCULAR | Status: AC | PRN
  Administered 2017-10-25: 3 mL

## 2017-10-25 MED ORDER — METHYLPREDNISOLONE ACETATE 40 MG/ML IJ SUSP
40.0000 mg | INTRAMUSCULAR | Status: AC | PRN
Start: 2017-10-25 — End: 2017-10-25
  Administered 2017-10-25: 40 mg via INTRA_ARTICULAR

## 2017-10-25 NOTE — Progress Notes (Signed)
Office Visit Note   Patient: Beverly Jimenez           Date of Birth: January 06, 1925           MRN: [id number] Visit Date: 10/25/2017              Requested by: Marton Redwood, MD [address] [place], [place] [address] PCP: Marton Redwood, MD   Assessment & Plan: Visit Diagnoses:  1. Acute pain of left knee   2. Acute pain of left shoulder     Plan: Given her age I think she would benefit from just trying a steroid injection in the left shoulder subacromial space in the left knee.  I described in detail the risks and benefits of the injection as well as her rationale behind having injections.  We had a good and thorough discussion about this and how it affects bone quality as well.  I do think that she is someone who should also try indications such as glucosamine or Tumeric and I spoke to this with her and her daughter as well.  All questions and concerns were answered and addressed.  She will follow-up as needed.  Follow-Up Instructions: Return if symptoms worsen or fail to improve.   Orders:  Orders Placed This Encounter  Procedures  . Large Joint Inj  . Large Joint Inj  . XR Knee 1-2 Views Left  . XR Shoulder Left   No orders of the defined types were placed in this encounter.     Procedures: Large Joint Inj: L knee on 10/25/2017 2:02 PM Indications: diagnostic evaluation and pain Details: 22 G 1.5 in needle, superolateral approach  Arthrogram: No  Medications: 3 mL lidocaine 1 %; 40 mg methylPREDNISolone acetate 40 MG/ML Outcome: tolerated well, no immediate complications Procedure, treatment alternatives, risks and benefits explained, specific risks discussed. Consent was given by the patient. Immediately prior to procedure a time out was called to verify the correct patient, procedure, equipment, support staff and site/side marked as required. Patient was prepped and draped in the usual sterile fashion.   Large Joint Inj: L subacromial bursa on 10/25/2017 2:02  PM Indications: pain and diagnostic evaluation Details: 22 G 1.5 in needle  Arthrogram: No  Medications: 3 mL lidocaine 1 %; 40 mg methylPREDNISolone acetate 40 MG/ML Outcome: tolerated well, no immediate complications Procedure, treatment alternatives, risks and benefits explained, specific risks discussed. Consent was given by the patient. Immediately prior to procedure a time out was called to verify the correct patient, procedure, equipment, support staff and site/side marked as required. Patient was prepped and draped in the usual sterile fashion.       Clinical Data: No additional findings.   Subjective: Chief Complaint  Patient presents with  . Left Knee - Pain   The patient comes in today with chief complaint of left knee pain and left shoulder pain.  Has been hurting for about a month on both areas and is more of a "soreness" she has no known injury but has fallen before at the first of the year.  She gets swelling in her knee.  She denies any back pain denies any neck pain.  She does use a cane to ambulate and does occasionally drive.  Her daughter is with her today.  She has had steroid injections in her back in the past.  She is actually had a steroid injection in her right shoulder which she said made all of her pain go away on the right side  and never returned.  She denies any numbness and tingling down her arm or down her leg today. HPI  Review of Systems She currently denies any headache, chest pain, shortness of breath, fever, chills, nausea, vomiting.  Objective: Vital Signs: There were no vitals taken for this visit.  Physical Exam She is alert and oriented x3 and in no acute distress Ortho Exam Examination of her left shoulder does show a painful arc of motion and some slight rotator cuff deficiency but overall great range of motion for someone his 83 years old.  Her right knee exam is normal.  The left knee shows no effusion.  It has good range of motion and  slight patellofemoral crepitation; the left knee shows slight valgu malalignmentNo specialty comments available.  Imaging: Xr Knee 1-2 Views Left  Result Date: 10/25/2017 2 views of the left knee show no acute findings.  There is only mild arthritic changes in the knee.  Xr Shoulder Left  Result Date: 10/25/2017 3 views of the left shoulder show well located shoulder with no acute findings.  The shoulder is in normal position of the glenohumeral joint and no significant decrease in the subacromial outlet.  There is acromioclavicular joint arthritic changes.    PMFS History: Patient Active Problem List   Diagnosis Date Noted  . Acute pain of left knee 10/25/2017  . Hemorrhage of rectum and anus 09/18/2017  . Chronic left-sided low back pain with left-sided sciatica 03/27/2017  . Influenza 01/17/2017  . Influenza A 01/16/2017  . HTN (hypertension) 01/16/2017  . Microcytic anemia 01/16/2017  . Acute hyponatremia 01/16/2017  . Asthma 01/16/2017  . UTI (urinary tract infection) 01/16/2017  . Anemia   . Respiratory distress   . Neck pain 01/11/2017  . Acute pain of left shoulder 01/11/2017  . Left leg pain 01/06/2015  . Varicose veins of both lower extremities with complications 00/93/8182  . HNP (herniated nucleus pulposus), lumbar 07/05/2013   Past Medical History:  Diagnosis Date  . Anemia   . Anxiety   . Arthritis    shoulder and knee pain  . Asthma   . Asthma   . Constipation   . Depression    Hx; of situational depression  . GERD (gastroesophageal reflux disease)   . Glaucoma    Hx; of  . High cholesterol   . History of hiatal hernia   . Myalgia   . Numbness and tingling    Hx: of B/LLE  . Osteoporosis   . Pneumonia    Hx: of "years ago"  . Shortness of breath     Family History  Problem Relation Age of Onset  . Varicose Veins Mother   . Diabetes Brother   . Heart disease Brother   . Hypertension Brother   . Varicose Veins Brother   . Hypertension  Other   . Heart disease Other   . Glaucoma Other   . Diabetes Other   . Varicose Veins Sister   . Varicose Veins Daughter     Past Surgical History:  Procedure Laterality Date  . APPENDECTOMY    . BACK SURGERY    . CHOLECYSTECTOMY    . COLONOSCOPY     Hx: of  . COLONOSCOPY WITH PROPOFOL N/A 09/18/2017   Procedure: COLONOSCOPY WITH PROPOFOL;  Surgeon: Wilford Corner, MD;  Location: [facility];  Service: Endoscopy;  Laterality: N/A;  . EYE SURGERY     catarac  . 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  HERNIA REPAIR    . LUMBAR LAMINECTOMY/DECOMPRESSION MICRODISCECTOMY Left 07/05/2013   Procedure: LUMBAR LAMINECTOMY/DECOMPRESSION MICRODISCECTOMY 1 LEVEL Left Lumbar five-Sacral one;  Surgeon: Charlie Pitter, MD;  Location: [facility];  Service: Neurosurgery;  Laterality: Left;  Left Lumbar Five-Sacral One Microdiskectomy  . VASCULAR SURGERY     varicose vein surgery   Social History   Occupational History  . Not on file  Tobacco Use  . Smoking status: Never Smoker  . Smokeless tobacco: Never Used  Substance and Sexual Activity  . Alcohol use: No  . Drug use: No  . Sexual activity: Not on file

## 2017-11-09 DIAGNOSIS — H9212 Otorrhea, left ear: Secondary | ICD-10-CM | POA: Diagnosis not present

## 2017-11-23 DIAGNOSIS — Z6829 Body mass index (BMI) 29.0-29.9, adult: Secondary | ICD-10-CM | POA: Diagnosis not present

## 2017-11-23 DIAGNOSIS — Z79899 Other long term (current) drug therapy: Secondary | ICD-10-CM | POA: Diagnosis not present

## 2017-11-23 DIAGNOSIS — M810 Age-related osteoporosis without current pathological fracture: Secondary | ICD-10-CM | POA: Diagnosis not present

## 2017-12-08 ENCOUNTER — Encounter (HOSPITAL_COMMUNITY): Payer: Medicare Other

## 2017-12-18 ENCOUNTER — Ambulatory Visit (INDEPENDENT_AMBULATORY_CARE_PROVIDER_SITE_OTHER): Payer: Medicare Other | Admitting: Physician Assistant

## 2017-12-18 ENCOUNTER — Other Ambulatory Visit (HOSPITAL_COMMUNITY): Payer: Self-pay | Admitting: *Deleted

## 2017-12-18 ENCOUNTER — Encounter (INDEPENDENT_AMBULATORY_CARE_PROVIDER_SITE_OTHER): Payer: Self-pay | Admitting: Physician Assistant

## 2017-12-18 DIAGNOSIS — M1712 Unilateral primary osteoarthritis, left knee: Secondary | ICD-10-CM | POA: Diagnosis not present

## 2017-12-18 DIAGNOSIS — M19012 Primary osteoarthritis, left shoulder: Secondary | ICD-10-CM | POA: Diagnosis not present

## 2017-12-18 MED ORDER — HYALURONAN 88 MG/4ML IX SOSY
88.0000 mg | PREFILLED_SYRINGE | INTRA_ARTICULAR | Status: AC | PRN
  Administered 2017-12-18: 88 mg via INTRA_ARTICULAR

## 2017-12-18 MED ORDER — LIDOCAINE HCL 1 % IJ SOLN
2.0000 mL | INTRAMUSCULAR | Status: AC | PRN
  Administered 2017-12-18: 2 mL

## 2017-12-18 MED ORDER — METHYLPREDNISOLONE ACETATE 40 MG/ML IJ SUSP
40.0000 mg | INTRAMUSCULAR | Status: AC | PRN
Start: 2017-12-18 — End: 2017-12-18
  Administered 2017-12-18: 40 mg via INTRA_ARTICULAR

## 2017-12-18 NOTE — Progress Notes (Signed)
   Procedure Note  Patient: Beverly Jimenez             Date of Birth: May 17, 1925           MRN: [id number]             Visit Date: 12/18/2017  HPI Ms. Spears well-known to Dr. Ninfa Linden service comes in today due to left knee pain and left shoulder pain.  She is seen by Dr. Ninfa Linden 10/25/2017 at that time was given cortisone injection subacromial left shoulder and left knee.  She states neither injection really gave her any relief.  She does have AC joint changes of the left shoulder.  Pains worse with crossover activities.  On plain films of her left knee she only had mild arthritic changes in the knee.  She has had no new injury to either knee or the left shoulder. Physical exam Left shoulder she is 5 out of 5 strength with external and internal rotation against resistance.  She has pain with crossover of the left shoulder.  Tenderness over the left acromioclavicular joint.  Left knee she has good range of motion of the knee.  No erythema or abnormal warmth.  No instability valgus varus stressing.  Slight effusion Procedures: Visit Diagnoses: Arthritis of left acromioclavicular joint - Plan: Medium Joint Inj: L acromioclavicular  Primary osteoarthritis of left knee - Plan: Large Joint Inj: L knee  Medium Joint Inj: L acromioclavicular on 12/18/2017 4:25 PM Details: anterior approach Medications: 2 mL lidocaine 1 %; 40 mg methylPREDNISolone acetate 40 MG/ML Patient was prepped and draped in the usual sterile fashion.   Large Joint Inj: L knee on 12/18/2017 4:26 PM Indications: pain Details: 22 G 1.5 in needle, anterolateral approach  Arthrogram: No  Medications: 88 mg Hyaluronan 88 MG/4ML; 2 mL lidocaine 1 % Aspirate: 20 mL yellow Outcome: tolerated well, no immediate complications Procedure, treatment alternatives, risks and benefits explained, specific risks discussed. Consent was given by the patient. Immediately prior to procedure a time out was called to verify the correct patient,  procedure, equipment, support staff and site/side marked as required. Patient was prepped and draped in the usual sterile fashion.     Plan: We will see her back in 8 weeks to check the progress of her left knee status post the Monovisc injection.  She is reminded that it may take 4-6 weeks for pain to begin to resolve with the supplemental injection.  Questions were encouraged and answered.

## 2017-12-19 ENCOUNTER — Ambulatory Visit (HOSPITAL_COMMUNITY)
Admission: RE | Admit: 2017-12-19 | Discharge: 2017-12-19 | Disposition: A | Discharge status: Home / Self Care | Payer: Medicare Other | Source: Ambulatory Visit | Attending: Internal Medicine | Admitting: Internal Medicine

## 2017-12-19 DIAGNOSIS — M810 Age-related osteoporosis without current pathological fracture: Secondary | ICD-10-CM | POA: Diagnosis not present

## 2017-12-19 MED ORDER — ZOLEDRONIC ACID 5 MG/100ML IV SOLN
INTRAVENOUS | Status: AC
  Filled 2017-12-19: qty 100

## 2017-12-19 MED ORDER — ZOLEDRONIC ACID 5 MG/100ML IV SOLN
5.0000 mg | Freq: Once | INTRAVENOUS | Status: AC
  Administered 2017-12-19: 5 mg via INTRAVENOUS

## 2018-01-08 DIAGNOSIS — H9212 Otorrhea, left ear: Secondary | ICD-10-CM | POA: Diagnosis not present

## 2018-01-09 DIAGNOSIS — H401131 Primary open-angle glaucoma, bilateral, mild stage: Secondary | ICD-10-CM | POA: Diagnosis not present

## 2018-01-09 DIAGNOSIS — Z961 Presence of intraocular lens: Secondary | ICD-10-CM | POA: Diagnosis not present

## 2018-02-07 DIAGNOSIS — M545 Low back pain: Secondary | ICD-10-CM | POA: Diagnosis not present

## 2018-02-07 DIAGNOSIS — M25562 Pain in left knee: Secondary | ICD-10-CM | POA: Diagnosis not present

## 2018-02-07 DIAGNOSIS — M7062 Trochanteric bursitis, left hip: Secondary | ICD-10-CM | POA: Diagnosis not present

## 2018-02-07 DIAGNOSIS — M25512 Pain in left shoulder: Secondary | ICD-10-CM | POA: Diagnosis not present

## 2018-02-09 DIAGNOSIS — H9212 Otorrhea, left ear: Secondary | ICD-10-CM | POA: Diagnosis not present

## 2018-02-15 ENCOUNTER — Ambulatory Visit (INDEPENDENT_AMBULATORY_CARE_PROVIDER_SITE_OTHER): Payer: Medicare Other | Admitting: Physician Assistant

## 2018-02-28 DIAGNOSIS — M25512 Pain in left shoulder: Secondary | ICD-10-CM | POA: Diagnosis not present

## 2018-03-29 DIAGNOSIS — R197 Diarrhea, unspecified: Secondary | ICD-10-CM | POA: Diagnosis not present

## 2018-04-01 ENCOUNTER — Encounter (HOSPITAL_COMMUNITY): Payer: Self-pay | Admitting: Emergency Medicine

## 2018-04-01 ENCOUNTER — Emergency Department (HOSPITAL_COMMUNITY)
Admission: EM | Admit: 2018-04-01 | Discharge: 2018-04-01 | Disposition: A | Discharge status: Home / Self Care | Payer: Medicare Other | Source: Home / Self Care | Attending: Emergency Medicine | Admitting: Emergency Medicine

## 2018-04-01 DIAGNOSIS — E871 Hypo-osmolality and hyponatremia: Secondary | ICD-10-CM | POA: Diagnosis not present

## 2018-04-01 DIAGNOSIS — R109 Unspecified abdominal pain: Secondary | ICD-10-CM | POA: Diagnosis not present

## 2018-04-01 DIAGNOSIS — A0472 Enterocolitis due to Clostridium difficile, not specified as recurrent: Secondary | ICD-10-CM | POA: Diagnosis not present

## 2018-04-01 DIAGNOSIS — Z7982 Long term (current) use of aspirin: Secondary | ICD-10-CM

## 2018-04-01 DIAGNOSIS — N179 Acute kidney failure, unspecified: Secondary | ICD-10-CM | POA: Diagnosis not present

## 2018-04-01 DIAGNOSIS — I10 Essential (primary) hypertension: Secondary | ICD-10-CM

## 2018-04-01 DIAGNOSIS — J45909 Unspecified asthma, uncomplicated: Secondary | ICD-10-CM | POA: Insufficient documentation

## 2018-04-01 DIAGNOSIS — R197 Diarrhea, unspecified: Secondary | ICD-10-CM

## 2018-04-01 DIAGNOSIS — R509 Fever, unspecified: Secondary | ICD-10-CM | POA: Diagnosis not present

## 2018-04-01 DIAGNOSIS — Z79899 Other long term (current) drug therapy: Secondary | ICD-10-CM | POA: Insufficient documentation

## 2018-04-01 DIAGNOSIS — H409 Unspecified glaucoma: Secondary | ICD-10-CM | POA: Diagnosis not present

## 2018-04-01 DIAGNOSIS — K219 Gastro-esophageal reflux disease without esophagitis: Secondary | ICD-10-CM | POA: Diagnosis not present

## 2018-04-01 DIAGNOSIS — A414 Sepsis due to anaerobes: Secondary | ICD-10-CM | POA: Diagnosis not present

## 2018-04-01 DIAGNOSIS — K529 Noninfective gastroenteritis and colitis, unspecified: Secondary | ICD-10-CM | POA: Diagnosis not present

## 2018-04-01 DIAGNOSIS — R531 Weakness: Secondary | ICD-10-CM | POA: Diagnosis not present

## 2018-04-01 LAB — CBC
HCT: 28.1 % — ABNORMAL LOW (ref 36.0–46.0)
Hemoglobin: 9 g/dL — ABNORMAL LOW (ref 12.0–15.0)
MCH: 26 pg (ref 26.0–34.0)
MCHC: 32 g/dL (ref 30.0–36.0)
MCV: 81.2 fL (ref 78.0–100.0)
Platelets: 265 10*3/uL (ref 150–400)
RBC: 3.46 MIL/uL — ABNORMAL LOW (ref 3.87–5.11)
RDW: 14.1 % (ref 11.5–15.5)
WBC: 6.3 10*3/uL (ref 4.0–10.5)

## 2018-04-01 LAB — COMPREHENSIVE METABOLIC PANEL
ALT: 16 U/L (ref 14–54)
AST: 23 U/L (ref 15–41)
Albumin: 3.7 g/dL (ref 3.5–5.0)
Alkaline Phosphatase: 54 U/L (ref 38–126)
Anion gap: 10 (ref 5–15)
BUN: 10 mg/dL (ref 6–20)
CO2: 23 mmol/L (ref 22–32)
Calcium: 9.6 mg/dL (ref 8.9–10.3)
Chloride: 97 mmol/L — ABNORMAL LOW (ref 101–111)
Creatinine, Ser: 0.95 mg/dL (ref 0.44–1.00)
GFR calc Af Amer: 59 mL/min — ABNORMAL LOW (ref >60)
GFR calc non Af Amer: 51 mL/min — ABNORMAL LOW (ref >60)
Glucose, Bld: 106 mg/dL — ABNORMAL HIGH (ref 65–99)
Potassium: 3.9 mmol/L (ref 3.5–5.1)
Sodium: 130 mmol/L — ABNORMAL LOW (ref 135–145)
Total Bilirubin: 0.6 mg/dL (ref 0.3–1.2)
Total Protein: 7.3 g/dL (ref 6.5–8.1)

## 2018-04-01 LAB — LIPASE, BLOOD: Lipase: 21 U/L (ref 11–51)

## 2018-04-01 NOTE — ED Triage Notes (Signed)
Pt ben having intermittent diarrhea and abd pain for several days. Went to PCP and was put on probiotics which helped slow it down but still not feeling well and having skin irration due to diarrhea.

## 2018-04-01 NOTE — ED Notes (Signed)
Patient made aware urine sample is needed. 

## 2018-04-03 ENCOUNTER — Emergency Department (HOSPITAL_COMMUNITY): Payer: Medicare Other

## 2018-04-03 ENCOUNTER — Encounter (HOSPITAL_COMMUNITY): Payer: Self-pay | Admitting: Emergency Medicine

## 2018-04-03 ENCOUNTER — Inpatient Hospital Stay (HOSPITAL_COMMUNITY)
Admission: EM | Admit: 2018-04-03 | Discharge: 2018-04-09 | DRG: 872 | Disposition: A | Discharge status: Skilled Nursing Facility | Payer: Medicare Other | Attending: Internal Medicine | Admitting: Internal Medicine

## 2018-04-03 DIAGNOSIS — K219 Gastro-esophageal reflux disease without esophagitis: Secondary | ICD-10-CM | POA: Diagnosis present

## 2018-04-03 DIAGNOSIS — N179 Acute kidney failure, unspecified: Secondary | ICD-10-CM

## 2018-04-03 DIAGNOSIS — M2550 Pain in unspecified joint: Secondary | ICD-10-CM | POA: Diagnosis not present

## 2018-04-03 DIAGNOSIS — A414 Sepsis due to anaerobes: Principal | ICD-10-CM | POA: Diagnosis present

## 2018-04-03 DIAGNOSIS — E871 Hypo-osmolality and hyponatremia: Secondary | ICD-10-CM | POA: Diagnosis not present

## 2018-04-03 DIAGNOSIS — I959 Hypotension, unspecified: Secondary | ICD-10-CM | POA: Diagnosis present

## 2018-04-03 DIAGNOSIS — E7800 Pure hypercholesterolemia, unspecified: Secondary | ICD-10-CM | POA: Diagnosis present

## 2018-04-03 DIAGNOSIS — Z7951 Long term (current) use of inhaled steroids: Secondary | ICD-10-CM | POA: Diagnosis not present

## 2018-04-03 DIAGNOSIS — I10 Essential (primary) hypertension: Secondary | ICD-10-CM | POA: Diagnosis present

## 2018-04-03 DIAGNOSIS — Z7982 Long term (current) use of aspirin: Secondary | ICD-10-CM

## 2018-04-03 DIAGNOSIS — Z8249 Family history of ischemic heart disease and other diseases of the circulatory system: Secondary | ICD-10-CM

## 2018-04-03 DIAGNOSIS — H409 Unspecified glaucoma: Secondary | ICD-10-CM | POA: Diagnosis present

## 2018-04-03 DIAGNOSIS — E785 Hyperlipidemia, unspecified: Secondary | ICD-10-CM | POA: Diagnosis not present

## 2018-04-03 DIAGNOSIS — K529 Noninfective gastroenteritis and colitis, unspecified: Secondary | ICD-10-CM

## 2018-04-03 DIAGNOSIS — Z7189 Other specified counseling: Secondary | ICD-10-CM | POA: Diagnosis not present

## 2018-04-03 DIAGNOSIS — A0472 Enterocolitis due to Clostridium difficile, not specified as recurrent: Secondary | ICD-10-CM | POA: Diagnosis present

## 2018-04-03 DIAGNOSIS — E876 Hypokalemia: Secondary | ICD-10-CM | POA: Diagnosis present

## 2018-04-03 DIAGNOSIS — E860 Dehydration: Secondary | ICD-10-CM | POA: Diagnosis present

## 2018-04-03 DIAGNOSIS — Z881 Allergy status to other antibiotic agents status: Secondary | ICD-10-CM | POA: Diagnosis not present

## 2018-04-03 DIAGNOSIS — Z882 Allergy status to sulfonamides status: Secondary | ICD-10-CM | POA: Diagnosis not present

## 2018-04-03 DIAGNOSIS — M6281 Muscle weakness (generalized): Secondary | ICD-10-CM | POA: Diagnosis not present

## 2018-04-03 DIAGNOSIS — R509 Fever, unspecified: Secondary | ICD-10-CM | POA: Diagnosis not present

## 2018-04-03 DIAGNOSIS — Z833 Family history of diabetes mellitus: Secondary | ICD-10-CM

## 2018-04-03 DIAGNOSIS — M170 Bilateral primary osteoarthritis of knee: Secondary | ICD-10-CM | POA: Diagnosis present

## 2018-04-03 DIAGNOSIS — Z79899 Other long term (current) drug therapy: Secondary | ICD-10-CM

## 2018-04-03 DIAGNOSIS — J45909 Unspecified asthma, uncomplicated: Secondary | ICD-10-CM | POA: Diagnosis present

## 2018-04-03 DIAGNOSIS — Z9049 Acquired absence of other specified parts of digestive tract: Secondary | ICD-10-CM | POA: Diagnosis not present

## 2018-04-03 DIAGNOSIS — R109 Unspecified abdominal pain: Secondary | ICD-10-CM | POA: Diagnosis not present

## 2018-04-03 DIAGNOSIS — R531 Weakness: Secondary | ICD-10-CM | POA: Diagnosis not present

## 2018-04-03 DIAGNOSIS — M19012 Primary osteoarthritis, left shoulder: Secondary | ICD-10-CM | POA: Diagnosis present

## 2018-04-03 DIAGNOSIS — R197 Diarrhea, unspecified: Secondary | ICD-10-CM | POA: Diagnosis not present

## 2018-04-03 DIAGNOSIS — D72829 Elevated white blood cell count, unspecified: Secondary | ICD-10-CM | POA: Diagnosis present

## 2018-04-03 DIAGNOSIS — Z7401 Bed confinement status: Secondary | ICD-10-CM | POA: Diagnosis not present

## 2018-04-03 DIAGNOSIS — R488 Other symbolic dysfunctions: Secondary | ICD-10-CM | POA: Diagnosis not present

## 2018-04-03 DIAGNOSIS — M810 Age-related osteoporosis without current pathological fracture: Secondary | ICD-10-CM | POA: Diagnosis present

## 2018-04-03 DIAGNOSIS — K5792 Diverticulitis of intestine, part unspecified, without perforation or abscess without bleeding: Secondary | ICD-10-CM | POA: Diagnosis not present

## 2018-04-03 DIAGNOSIS — A419 Sepsis, unspecified organism: Secondary | ICD-10-CM | POA: Diagnosis not present

## 2018-04-03 DIAGNOSIS — D649 Anemia, unspecified: Secondary | ICD-10-CM | POA: Diagnosis present

## 2018-04-03 DIAGNOSIS — J45901 Unspecified asthma with (acute) exacerbation: Secondary | ICD-10-CM | POA: Diagnosis not present

## 2018-04-03 DIAGNOSIS — M19011 Primary osteoarthritis, right shoulder: Secondary | ICD-10-CM | POA: Diagnosis present

## 2018-04-03 DIAGNOSIS — R5381 Other malaise: Secondary | ICD-10-CM | POA: Diagnosis not present

## 2018-04-03 LAB — COMPREHENSIVE METABOLIC PANEL
ALT: 17 U/L (ref 14–54)
ANION GAP: 12 (ref 5–15)
AST: 28 U/L (ref 15–41)
Albumin: 3.2 g/dL — ABNORMAL LOW (ref 3.5–5.0)
Alkaline Phosphatase: 65 U/L (ref 38–126)
BILIRUBIN TOTAL: 0.7 mg/dL (ref 0.3–1.2)
BUN: 23 mg/dL — ABNORMAL HIGH (ref 6–20)
CO2: 20 mmol/L — ABNORMAL LOW (ref 22–32)
Calcium: 8.5 mg/dL — ABNORMAL LOW (ref 8.9–10.3)
Chloride: 94 mmol/L — ABNORMAL LOW (ref 101–111)
Creatinine, Ser: 1.89 mg/dL — ABNORMAL HIGH (ref 0.44–1.00)
GFR calc Af Amer: 26 mL/min — ABNORMAL LOW (ref >60)
GFR, EST NON AFRICAN AMERICAN: 22 mL/min — AB (ref >60)
Glucose, Bld: 142 mg/dL — ABNORMAL HIGH (ref 65–99)
POTASSIUM: 3.6 mmol/L (ref 3.5–5.1)
Sodium: 126 mmol/L — ABNORMAL LOW (ref 135–145)
TOTAL PROTEIN: 7 g/dL (ref 6.5–8.1)

## 2018-04-03 LAB — CBC
HEMATOCRIT: 29.4 % — AB (ref 36.0–46.0)
HEMOGLOBIN: 9.7 g/dL — AB (ref 12.0–15.0)
MCH: 26.1 pg (ref 26.0–34.0)
MCHC: 33 g/dL (ref 30.0–36.0)
MCV: 79.2 fL (ref 78.0–100.0)
Platelets: 285 10*3/uL (ref 150–400)
RBC: 3.71 MIL/uL — ABNORMAL LOW (ref 3.87–5.11)
RDW: 13.8 % (ref 11.5–15.5)
WBC: 21.7 10*3/uL — AB (ref 4.0–10.5)

## 2018-04-03 LAB — LIPASE, BLOOD: Lipase: 19 U/L (ref 11–51)

## 2018-04-03 MED ORDER — TRAMADOL HCL 50 MG PO TABS
50.0000 mg | ORAL_TABLET | Freq: Four times a day (QID) | ORAL | Status: DC | PRN
  Administered 2018-04-04 – 2018-04-09 (×3): 50 mg via ORAL
  Filled 2018-04-03 (×3): qty 1

## 2018-04-03 MED ORDER — ASPIRIN EC 81 MG PO TBEC
81.0000 mg | DELAYED_RELEASE_TABLET | Freq: Every day | ORAL | Status: DC
  Administered 2018-04-04 – 2018-04-09 (×6): 81 mg via ORAL
  Filled 2018-04-03 (×6): qty 1

## 2018-04-03 MED ORDER — ONDANSETRON HCL 4 MG PO TABS: 4.0000 mg | ORAL_TABLET | Freq: Four times a day (QID) | ORAL | Status: DC | PRN

## 2018-04-03 MED ORDER — VANCOMYCIN 50 MG/ML ORAL SOLUTION
125.0000 mg | Freq: Four times a day (QID) | ORAL | Status: DC
  Administered 2018-04-04 – 2018-04-09 (×25): 125 mg via ORAL
  Filled 2018-04-03 (×26): qty 2.5

## 2018-04-03 MED ORDER — FLUTICASONE PROPIONATE 50 MCG/ACT NA SUSP
2.0000 | Freq: Every day | NASAL | Status: DC
  Administered 2018-04-04 – 2018-04-09 (×7): 2 via NASAL
  Filled 2018-04-03: qty 16

## 2018-04-03 MED ORDER — POTASSIUM CHLORIDE IN NACL 20-0.9 MEQ/L-% IV SOLN
INTRAVENOUS | Status: AC
  Administered 2018-04-04: 02:00:00 via INTRAVENOUS
  Filled 2018-04-03 (×2): qty 1000

## 2018-04-03 MED ORDER — ONDANSETRON HCL 4 MG/2ML IJ SOLN: 4.0000 mg | Freq: Four times a day (QID) | INTRAMUSCULAR | Status: DC | PRN

## 2018-04-03 MED ORDER — ENOXAPARIN SODIUM 30 MG/0.3ML ~~LOC~~ SOLN
30.0000 mg | SUBCUTANEOUS | Status: DC
  Administered 2018-04-04: 30 mg via SUBCUTANEOUS
  Filled 2018-04-03: qty 0.3

## 2018-04-03 MED ORDER — SODIUM CHLORIDE 0.9 % IV BOLUS
1000.0000 mL | Freq: Once | INTRAVENOUS | Status: AC
  Administered 2018-04-03: 1000 mL via INTRAVENOUS

## 2018-04-03 MED ORDER — IOHEXOL 300 MG/ML  SOLN
50.0000 mL | Freq: Once | INTRAMUSCULAR | Status: AC | PRN
  Administered 2018-04-03: 30 mL via ORAL

## 2018-04-03 MED ORDER — ALBUTEROL SULFATE (2.5 MG/3ML) 0.083% IN NEBU: 3.0000 mL | INHALATION_SOLUTION | RESPIRATORY_TRACT | Status: DC | PRN

## 2018-04-03 MED ORDER — LEVOFLOXACIN IN D5W 750 MG/150ML IV SOLN
750.0000 mg | INTRAVENOUS | Status: AC
  Administered 2018-04-04: 750 mg via INTRAVENOUS
  Filled 2018-04-03: qty 150

## 2018-04-03 NOTE — ED Provider Notes (Signed)
[facility] DEPT Provider Note   CSN: [id number] Arrival date & time: 04/01/18  1640     History   Chief Complaint Chief Complaint  Patient presents with  . Diarrhea  . Abdominal Pain    HPI Beverly Jimenez is a 84 y.o. female.  HPI   84 year old female with diarrhea.  Patient was on amoxicillin for dental work last week.  Diarrhea started shortly later.  She tried taking some Pepto-Bismol with some improvement.  She will follow with her family doctor to prescribe probiotics.  She is only had a couple doses of this thus far.  She continues to have some loose stools.  Intermittent crampy abdominal pain.  No fevers or chills.  No blood in her stool.  No acute urinary complaints.  No nausea/vomiting.  Past Medical History:  Diagnosis Date  . Anemia   . Anxiety   . Arthritis    shoulder and knee pain  . Asthma   . Asthma   . Constipation   . Depression    Hx; of situational depression  . GERD (gastroesophageal reflux disease)   . Glaucoma    Hx; of  . High cholesterol   . History of hiatal hernia   . Myalgia   . Numbness and tingling    Hx: of B/LLE  . Osteoporosis   . Pneumonia    Hx: of "years ago"  . Shortness of breath     Patient Active Problem List   Diagnosis Date Noted  . Acute pain of left knee 10/25/2017  . Hemorrhage of rectum and anus 09/18/2017  . Chronic left-sided low back pain with left-sided sciatica 03/27/2017  . Influenza 01/17/2017  . Influenza A 01/16/2017  . HTN (hypertension) 01/16/2017  . Microcytic anemia 01/16/2017  . Acute hyponatremia 01/16/2017  . Asthma 01/16/2017  . UTI (urinary tract infection) 01/16/2017  . Anemia   . Respiratory distress   . Neck pain 01/11/2017  . Acute pain of left shoulder 01/11/2017  . Left leg pain 01/06/2015  . Varicose veins of both lower extremities with complications 15/17/6160  . HNP (herniated nucleus pulposus), lumbar 07/05/2013    Past Surgical History:    Procedure Laterality Date  . APPENDECTOMY    . BACK SURGERY    . CHOLECYSTECTOMY    . COLONOSCOPY     Hx: of  . COLONOSCOPY WITH PROPOFOL N/A 09/18/2017   Procedure: COLONOSCOPY WITH PROPOFOL;  Surgeon: Wilford Corner, MD;  Location: [facility];  Service: Endoscopy;  Laterality: N/A;  . EYE SURGERY     catarac  . 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 HERNIA REPAIR    . LUMBAR LAMINECTOMY/DECOMPRESSION MICRODISCECTOMY Left 07/05/2013   Procedure: LUMBAR LAMINECTOMY/DECOMPRESSION MICRODISCECTOMY 1 LEVEL Left Lumbar five-Sacral one;  Surgeon: Charlie Pitter, MD;  Location: [facility];  Service: Neurosurgery;  Laterality: Left;  Left Lumbar Five-Sacral One Microdiskectomy  . VASCULAR SURGERY     varicose vein surgery     OB History   None      Home Medications    Prior to Admission medications   Medication Sig Start Date End Date Taking? Authorizing Provider  acetaminophen (TYLENOL) 500 MG tablet Take 1-2 tablets (500-1,000 mg total) by mouth every 8 (eight) hours as needed for mild pain, moderate pain, fever or headache. 09/18/17   Wilford Corner, MD  albuterol (PROVENTIL HFA;VENTOLIN HFA) 108 (90 Base) MCG/ACT inhaler Inhale 2 puffs into the lungs every 4 (  four) hours as needed for wheezing or shortness of breath. 01/18/17   GhimireHenreitta Leber, MD  aspirin EC 81 MG tablet Take 81 mg by mouth daily.    [provider]  bimatoprost (LUMIGAN) 0.01 % SOLN Place 1 drop into both eyes at bedtime.    [provider]  Calcium Carbonate-Vit D-Min (CALTRATE PLUS PO) Take 2 tablets by mouth 2 (two) times daily.     [provider]  Cyanocobalamin (VITAMIN B-12 PO) Place 1 tablet under the tongue daily.    [provider]  dexlansoprazole (DEXILANT) 60 MG capsule Take 60 mg by mouth daily.    [provider]  fish oil-omega-3 fatty acids 1000 MG capsule Take 1 g by mouth 2 (two) times daily.     [provider]  fluticasone  (FLONASE) 50 MCG/ACT nasal spray Place 2 sprays into the nose at bedtime.     [provider]  Fluticasone-Salmeterol (ADVAIR) 100-50 MCG/DOSE AEPB Inhale 1 puff into the lungs 2 (two) times daily.    [provider]  guaiFENesin (MUCINEX) 600 MG 12 hr tablet Take 1 tablet (600 mg total) by mouth 2 (two) times daily. Patient taking differently: Take 600 mg by mouth daily as needed for cough or to loosen phlegm.  01/18/17   Ghimire, Henreitta Leber, MD  irbesartan (AVAPRO) 150 MG tablet Take 150 mg by mouth daily.    [provider]  niacin 500 MG tablet Take 500 mg by mouth daily with breakfast.    [provider]  Potassium 99 MG TABS Take 99 mg by mouth daily.    [provider]  sodium chloride (OCEAN) 0.65 % SOLN nasal spray Place 1 spray into both nostrils as needed for congestion.    [provider]  traMADol (ULTRAM) 50 MG tablet Take 50 mg by mouth every 6 (six) hours as needed for moderate pain or severe pain.     [provider]  vitamin C (ASCORBIC ACID) 500 MG tablet Take 500 mg by mouth daily.    [provider]  vitamin E 400 UNIT capsule Take 400 Units by mouth daily.    [provider]    Family History Family History  Problem Relation Age of Onset  . Varicose Veins Mother   . Diabetes Brother   . Heart disease Brother   . Hypertension Brother   . Varicose Veins Brother   . Hypertension Other   . Heart disease Other   . Glaucoma Other   . Diabetes Other   . Varicose Veins Sister   . Varicose Veins Daughter     Social History Social History   Tobacco Use  . Smoking status: Never Smoker  . Smokeless tobacco: Never Used  Substance Use Topics  . Alcohol use: No  . Drug use: No     Allergies   Amoxicillin; Macrodantin [nitrofurantoin macrocrystal]; and Sulfa antibiotics   Review of Systems Review of Systems I have reviewed the patient's medical history in detail and updated the  computerized patient record.  Physical Exam Updated Vital Signs BP (!) 132/51   Pulse 87   Temp 99.5 F (37.5 C) (Oral)   Resp 17   SpO2 98%   Physical Exam  Constitutional: She appears well-developed and well-nourished. No distress.  HENT:  Head: Normocephalic and atraumatic.  Eyes: Conjunctivae are normal. Right eye exhibits no discharge. Left eye exhibits no discharge.  Neck: Neck supple.  Cardiovascular: Normal rate, regular rhythm and normal  heart sounds. Exam reveals no gallop and no friction rub.  No murmur heard. Pulmonary/Chest: Effort normal and breath sounds normal. No respiratory distress.  Abdominal: Soft. She exhibits no distension. There is no tenderness.  Musculoskeletal: She exhibits no edema or tenderness.  Neurological: She is alert.  Skin: Skin is warm and dry.  Psychiatric: She has a normal mood and affect. Her behavior is normal. Thought content normal.  Nursing note and vitals reviewed.    ED Treatments / Results  Labs (all labs ordered are listed, but only abnormal results are displayed) Labs Reviewed  COMPREHENSIVE METABOLIC PANEL - Abnormal; Notable for the following components:      Result Value   Sodium 130 (*)    Chloride 97 (*)    Glucose, Bld 106 (*)    GFR calc non Af Amer 51 (*)    GFR calc Af Amer 59 (*)    All other components within normal limits  CBC - Abnormal; Notable for the following components:   RBC 3.46 (*)    Hemoglobin 9.0 (*)    HCT 28.1 (*)    All other components within normal limits  LIPASE, BLOOD    EKG None  Radiology No results found.  Procedures Procedures (including critical care time)  Medications Ordered in ED Medications - No data to display   Initial Impression / Assessment and Plan / ED Course  I have reviewed the triage vital signs and the nursing notes.  Pertinent labs & imaging results that were available during my care of the patient were reviewed by me and considered in my medical  decision making (see chart for details).     84 year old female with diarrhea.  She reports that her symptoms of actually improved now her chief complaint is primarily rectal pain.  Likely irritation from frequent stooling.  Discussed trying a barrier cream.  Continue probiotics.  Her abdominal exam is benign.  Final Clinical Impressions(s) / ED Diagnoses   Final diagnoses:  Diarrhea, unspecified type    ED Discharge Orders    None       Virgel Manifold, MD 04/03/18 [phone]

## 2018-04-03 NOTE — ED Notes (Addendum)
ED TO INPATIENT HANDOFF REPORT  Name/Age/Gender Beverly Jimenez 84 y.o. female  Code Status Code Status History    Date Active Date Inactive Code Status Order ID Comments User Context   01/16/2017 0913 01/18/2017 1900 Full Code [id number]  Samella Parr, NP Inpatient   07/05/2013 1333 07/06/2013 1302 Full Code [id number]  Charlie Pitter, MD Inpatient      Home/SNF/Other Home  Chief Complaint fever, weakness  Level of Care/Admitting Diagnosis ED Disposition    ED Disposition Condition [facility]: [facility] [[id number]]  Level of Care: Telemetry [5]  Admit to tele based on following criteria: Other see comments  Comments: Colitis  Diagnosis: Colitis [[id number]]  Admitting Physician: Kara Pacer  Attending Physician: Bethena Roys [phone]  Estimated length of stay: past midnight tomorrow  Certification:: I certify this patient will need inpatient services for at least 2 midnights  PT Class (Do Not Modify): Inpatient [101]  PT Acc Code (Do Not Modify): Private [1]       Medical History Past Medical History:  Diagnosis Date  . Anemia   . Anxiety   . Arthritis    shoulder and knee pain  . Asthma   . Asthma   . Constipation   . Depression    Hx; of situational depression  . GERD (gastroesophageal reflux disease)   . Glaucoma    Hx; of  . High cholesterol   . History of hiatal hernia   . Myalgia   . Numbness and tingling    Hx: of B/LLE  . Osteoporosis   . Pneumonia    Hx: of "years ago"  . Shortness of breath     Allergies Allergies  Allergen Reactions  . Amoxicillin Hives, Itching and Rash    Has patient had a PCN reaction causing immediate rash, facial/tongue/throat swelling, SOB or lightheadedness with hypotension: Unknown Has patient had a PCN reaction causing severe rash involving mucus membranes or skin necrosis: Yes  Has patient had a PCN reaction that required hospitalization: No Has patient  had a PCN reaction occurring within the last 10 years: No  If all of the above answers are "NO", then may proceed with Cephalosporin use.   Clancy Gourd [Nitrofurantoin Macrocrystal] Shortness Of Breath  . Sulfa Antibiotics Rash    Shortness of breath    IV Location/Drains/Wounds Patient Lines/Drains/Airways Status   Active Line/Drains/Airways    Name:   Placement date:   Placement time:   Site:   Days:   Peripheral IV 04/03/18 Left Hand   04/03/18    1848    Hand   less than 1   Peripheral IV 04/03/18 Right Antecubital   04/03/18    1848    Antecubital   less than 1   Incision 07/05/13 Back Other (Comment)   07/05/13    1122     1733          Labs/Imaging Results for orders placed or performed during the hospital encounter of 04/03/18 (from the past 48 hour(s))  Lipase, blood     Status: None   Collection Time: 04/03/18  4:54 PM  Result Value Ref Range   Lipase 19 11 - 51 U/L    Comment: Performed at [facility], [place] [address] [place], [place] [address]  Comprehensive metabolic panel     Status: Abnormal   Collection Time: 04/03/18  4:54 PM  Result Value Ref Range   Sodium  126 (L) 135 - 145 mmol/L   Potassium 3.6 3.5 - 5.1 mmol/L   Chloride 94 (L) 101 - 111 mmol/L   CO2 20 (L) 22 - 32 mmol/L   Glucose, Bld 142 (H) 65 - 99 mg/dL   BUN 23 (H) 6 - 20 mg/dL   Creatinine, Ser 1.89 (H) 0.44 - 1.00 mg/dL   Calcium 8.5 (L) 8.9 - 10.3 mg/dL   Total Protein 7.0 6.5 - 8.1 g/dL   Albumin 3.2 (L) 3.5 - 5.0 g/dL   AST 28 15 - 41 U/L   ALT 17 14 - 54 U/L   Alkaline Phosphatase 65 38 - 126 U/L   Total Bilirubin 0.7 0.3 - 1.2 mg/dL   GFR calc non Af Amer 22 (L) >60 mL/min   GFR calc Af Amer 26 (L) >60 mL/min    Comment: (NOTE) The eGFR has been calculated using the CKD EPI equation. This calculation has not been validated in all clinical situations. eGFR's persistently <60 mL/min signify possible Chronic Kidney Disease.    Anion gap 12 5 - 15    Comment:  Performed at [facility], [place] [address] [place], [place] [address]  CBC     Status: Abnormal   Collection Time: 04/03/18  4:54 PM  Result Value Ref Range   WBC 21.7 (H) 4.0 - 10.5 K/uL   RBC 3.71 (L) 3.87 - 5.11 MIL/uL   Hemoglobin 9.7 (L) 12.0 - 15.0 g/dL   HCT 29.4 (L) 36.0 - 46.0 %   MCV 79.2 78.0 - 100.0 fL   MCH 26.1 26.0 - 34.0 pg   MCHC 33.0 30.0 - 36.0 g/dL   RDW 13.8 11.5 - 15.5 %   Platelets 285 150 - 400 K/uL    Comment: Performed at [facility], [place] [address] [place], [place] [address]   Ct Abdomen Pelvis Wo Contrast  Result Date: 04/03/2018 CLINICAL DATA:  Diarrhea for 1 week. Seen in ED a few days ago. Increased weakness and fever. EXAM: CT ABDOMEN AND PELVIS WITHOUT CONTRAST TECHNIQUE: Multidetector CT imaging of the abdomen and pelvis was performed following the standard protocol without IV contrast. COMPARISON:  07/27/2017 FINDINGS: Lower chest: Linear atelectasis in the lung bases. Small air cysts on the right. Coronary artery calcifications. Hepatobiliary: No focal liver abnormality is seen. Status post cholecystectomy. No biliary dilatation. Pancreas: Unremarkable. No pancreatic ductal dilatation or surrounding inflammatory changes. Spleen: Dense calcification in the splenic hilum may represent dystrophic calcification, granuloma, or a calcified splenic artery aneurysm. No change since prior study. Spleen size is normal. No focal lesions identified on noncontrast imaging. Adrenals/Urinary Tract: Adrenal glands are unremarkable. Kidneys are normal, without renal calculi, focal lesion, or hydronephrosis. Bladder is unremarkable. Stomach/Bowel: Stomach, small bowel, and colon are not abnormally distended. There is diffuse wall thickening throughout the colon with pericolonic fat stranding throughout. This is consistent with pancolitis and could represent pseudomembranous colitis or inflammatory bowel disease. Infectious colitis also a  possibility but probably less likely. The appendix is not identified. Vascular/Lymphatic: Aortic atherosclerosis. No enlarged abdominal or pelvic lymph nodes. Reproductive: Uterus and bilateral adnexa are unremarkable. Other: No abdominal wall hernia or abnormality. No abdominopelvic ascites. Musculoskeletal: Laminectomies with posterior fixation and intervertebral fusion from L3-L5. No destructive bone lesions. IMPRESSION: 1. Diffuse thickening of the colon wall with pericolonic fat stranding likely representing pseudomembranous colitis or inflammatory bowel disease. Infectious colitis probably less likely. 2. Linear atelectasis in the lung bases. 3. Aortic atherosclerosis. Electronically Signed  By: Lucienne Capers M.D.   On: 04/03/2018 21:55    Pending Labs Unresulted Labs (From admission, onward)   Start     Ordered   04/03/18 2249  Culture, blood (routine x 2)  BLOOD CULTURE X 2,   R     04/03/18 2248   04/03/18 2231  Magnesium  Add-on,   R     04/03/18 2230   04/03/18 1905  Gastrointestinal Panel by PCR , Stool  (Gastrointestinal Panel by PCR, Stool)  Once,   R     04/03/18 1905   04/03/18 1905  C difficile quick scan w PCR reflex  (C Difficile quick screen w PCR reflex panel)  Once, for 24 hours,   R     04/03/18 1905   04/03/18 1644  Urinalysis, Routine w reflex microscopic  STAT,   STAT     04/03/18 1643   Signed and Held  Basic metabolic panel  Tomorrow morning,   R     Signed and Held   Signed and Held  CBC  Tomorrow morning,   R     Signed and Held      Vitals/Pain Today's Vitals   04/03/18 2100 04/03/18 2148 04/03/18 2200 04/03/18 2230  BP: (!) 108/45 (!) 96/39 (!) 102/43 (!) 106/48  Pulse:  86  93  Resp: _0 (!) 21  Temp:      TempSrc:      SpO2:  100% 99% 98%  PainSc:        Isolation Precautions Enteric precautions (UV disinfection)  Medications Medications  vancomycin (VANCOCIN) 50 mg/mL oral solution 125 mg (has no administration in time range)   sodium chloride 0.9 % bolus 1,000 mL (0 mLs Intravenous Stopped 04/03/18 2138)  iohexol (OMNIPAQUE) 300 MG/ML solution 50 mL (30 mLs Oral Contrast Given 04/03/18 1932)    Mobility Walk with device Kasandra Knudsen)

## 2018-04-03 NOTE — ED Triage Notes (Signed)
Patient sent from PCP to r/o diverticulitis. C/o abdominal pain and diarrhea. Seen x2 days ago for same. Daughter reports PCP stated WBC elevated.

## 2018-04-03 NOTE — ED Notes (Signed)
Bed: WA04 Expected date:  Expected time:  Means of arrival:  Comments: Hold for [facility]

## 2018-04-03 NOTE — ED Notes (Signed)
Per PCP-states diarrhea for a week-states was seen in ED a few days ago-states increased weakness, fever-R/O diverticulitis

## 2018-04-03 NOTE — ED Notes (Signed)
Daughter Hoyle Sauer would like to be updated on pt.

## 2018-04-03 NOTE — H&P (Addendum)
History and Physical    Beverly Jimenez:[id number] DOB: 1925/03/30 DOA: 04/03/2018  PCP: Marton Redwood, MD   Patient coming from: Home   Chief Complaint: Diarrhea, Abdominal Pain  HPI: Beverly Jimenez is a 84 y.o. female with medical history significant for asthma, HTN.  Patient presented to the ED with complaints of multiple episodes of nonbloody loose stools and lower abdominal pain of one-week duration.  She reports fevers up to 101 at home today. Patient was seen 04/01/18, 2 days ago for similar symptoms but at that time blood work was unrevealing, and patient was discharged to home.  Patient subsequently followed up with her primary care provider , blood work revealed elevated WBC, ED evaluation was recommended for CT imaging.   Patient had dental work-tooth extraction, 3 weeks ago, for which she was prescribed 2 antibiotics, 1 of which was azithromycin, ?name of 2nd one.  ED Course: Systolic blood pressure 50D to 129.  Pulse 80s to 90s, O2 sats greater than 94% on room air temperature 99.7.  WBC elevated 21, creatinine elevated 1.89 from baseline 0.9-1.  Lipase- 21. CT abdomen and pelvis-diffuse thickening of the colon with pericolonic fat stranding likely representing pseudomembranous colitis or IBD.  Infectious colitis is probably less likely. 1L bolus given in ED. to CD5 GI pathogen panel ordered . hospitalist called to admit for colitis and AKI.  Review of Systems: As per HPI otherwise 10 point review of systems negative.   Past Medical History:  Diagnosis Date  . Anemia   . Anxiety   . Arthritis    shoulder and knee pain  . Asthma   . Asthma   . Constipation   . Depression    Hx; of situational depression  . GERD (gastroesophageal reflux disease)   . Glaucoma    Hx; of  . High cholesterol   . History of hiatal hernia   . Myalgia   . Numbness and tingling    Hx: of B/LLE  . Osteoporosis   . Pneumonia    Hx: of "years ago"  . Shortness of breath     Past  Surgical History:  Procedure Laterality Date  . APPENDECTOMY    . BACK SURGERY    . CHOLECYSTECTOMY    . COLONOSCOPY     Hx: of  . COLONOSCOPY WITH PROPOFOL N/A 09/18/2017   Procedure: COLONOSCOPY WITH PROPOFOL;  Surgeon: Wilford Corner, MD;  Location: [facility];  Service: Endoscopy;  Laterality: N/A;  . EYE SURGERY     catarac  . 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 HERNIA REPAIR    . LUMBAR LAMINECTOMY/DECOMPRESSION MICRODISCECTOMY Left 07/05/2013   Procedure: LUMBAR LAMINECTOMY/DECOMPRESSION MICRODISCECTOMY 1 LEVEL Left Lumbar five-Sacral one;  Surgeon: Charlie Pitter, MD;  Location: [facility];  Service: Neurosurgery;  Laterality: Left;  Left Lumbar Five-Sacral One Microdiskectomy  . VASCULAR SURGERY     varicose vein surgery     reports that she has never smoked. She has never used smokeless tobacco. She reports that she does not drink alcohol or use drugs.  Allergies  Allergen Reactions  . Amoxicillin Hives, Itching and Rash    Has patient had a PCN reaction causing immediate rash, facial/tongue/throat swelling, SOB or lightheadedness with hypotension: Unknown Has patient had a PCN reaction causing severe rash involving mucus membranes or skin necrosis: Yes  Has patient had a PCN reaction that required hospitalization: No Has patient had a PCN reaction occurring within the last  10 years: No  If all of the above answers are "NO", then may proceed with Cephalosporin use.   Clancy Gourd [Nitrofurantoin Macrocrystal] Shortness Of Breath  . Sulfa Antibiotics Rash    Shortness of breath    Family History  Problem Relation Age of Onset  . Varicose Veins Mother   . Diabetes Brother   . Heart disease Brother   . Hypertension Brother   . Varicose Veins Brother   . Hypertension Other   . Heart disease Other   . Glaucoma Other   . Diabetes Other   . Varicose Veins Sister   . Varicose Veins Daughter     Prior to Admission medications   Medication Sig Start  Date End Date Taking? Authorizing Provider  olmesartan (BENICAR) 20 MG tablet Take 20 mg by mouth daily.   Yes [provider]  acetaminophen (TYLENOL) 500 MG tablet Take 1-2 tablets (500-1,000 mg total) by mouth every 8 (eight) hours as needed for mild pain, moderate pain, fever or headache. 09/18/17   Wilford Corner, MD  albuterol (PROVENTIL HFA;VENTOLIN HFA) 108 (90 Base) MCG/ACT inhaler Inhale 2 puffs into the lungs every 4 (four) hours as needed for wheezing or shortness of breath. 01/18/17   GhimireHenreitta Leber, MD  aspirin EC 81 MG tablet Take 81 mg by mouth daily.    [provider]  bimatoprost (LUMIGAN) 0.01 % SOLN Place 1 drop into both eyes at bedtime.    [provider]  Calcium Carbonate-Vit D-Min (CALTRATE PLUS PO) Take 2 tablets by mouth 2 (two) times daily.     [provider]  Cyanocobalamin (VITAMIN B-12 PO) Place 1 tablet under the tongue daily.    [provider]  dexlansoprazole (DEXILANT) 60 MG capsule Take 60 mg by mouth daily.    [provider]  fish oil-omega-3 fatty acids 1000 MG capsule Take 1 g by mouth 2 (two) times daily.     [provider]  fluticasone (FLONASE) 50 MCG/ACT nasal spray Place 2 sprays into the nose at bedtime.     [provider]  Fluticasone-Salmeterol (ADVAIR) 100-50 MCG/DOSE AEPB Inhale 1 puff into the lungs 2 (two) times daily.    [provider]  guaiFENesin (MUCINEX) 600 MG 12 hr tablet Take 1 tablet (600 mg total) by mouth 2 (two) times daily. Patient taking differently: Take 600 mg by mouth daily as needed for cough or to loosen phlegm.  01/18/17   Ghimire, Henreitta Leber, MD  irbesartan (AVAPRO) 150 MG tablet Take 150 mg by mouth daily.    [provider]  niacin 500 MG tablet Take 500 mg by mouth daily with breakfast.    [provider]  Potassium 99 MG TABS Take 99 mg by mouth daily.    [provider]  sodium chloride (OCEAN) 0.65 % SOLN  nasal spray Place 1 spray into both nostrils as needed for congestion.    [provider]  traMADol (ULTRAM) 50 MG tablet Take 50 mg by mouth every 6 (six) hours as needed for moderate pain or severe pain.     [provider]  vitamin C (ASCORBIC ACID) 500 MG tablet Take 500 mg by mouth daily.    [provider]  vitamin E 400 UNIT capsule Take 400 Units by mouth daily.    [provider]    Physical Exam: Vitals:   04/03/18 2034 04/03/18 2100 04/03/18 2148 04/03/18 2200  BP: (!) 129/52 (!) 108/45 (!) 96/39 (!) 102/43  Pulse: 90  86   Resp: 18 18 16 18   Temp:      TempSrc:      SpO2: 100%  100% 99%    Constitutional: NAD, calm, comfortable Vitals:   04/03/18 2034 04/03/18 2100 04/03/18 2148 04/03/18 2200  BP: (!) 129/52 (!) 108/45 (!) 96/39 (!) 102/43  Pulse: 90  86   Resp: 18 18 16 18   Temp:      TempSrc:      SpO2: 100%  100% 99%   Eyes: PERRL, lids and conjunctivae normal ENMT: Mucous membranes are moist. Posterior pharynx clear of any exudate or lesions.Normal dentition.  Neck: normal, supple, no masses, no thyromegaly Respiratory: clear to auscultation bilaterally, no wheezing, no crackles. Normal respiratory effort. No accessory muscle use.  Cardiovascular: Regular rate and rhythm, no murmurs / rubs / gallops. No extremity edema. 2+ pedal pulses. No carotid bruits.  Abdomen: soft, without guarding, lower abdominal tenderness, no masses palpated. No hepatosplenomegaly. Bowel sounds positive.  Musculoskeletal: no clubbing / cyanosis. No joint deformity upper and lower extremities. Good ROM, no contractures. Normal muscle tone.  Skin: no rashes, lesions, ulcers. No induration Neurologic: CN 2-12 grossly intact.  Strength 5/5 in all 4.  Psychiatric: Normal judgment and insight. Alert and oriented x 3. Normal mood.   Labs on Admission: I have personally reviewed following labs and imaging studies  CBC: Recent Labs  Lab 04/01/18 1707  04/03/18 1654  WBC 6.3 21.7*  HGB 9.0* 9.7*  HCT 28.1* 29.4*  MCV 81.2 79.2  PLT 265 161   Basic Metabolic Panel: Recent Labs  Lab 04/01/18 1707 04/03/18 1654  NA 130* 126*  K 3.9 3.6  CL 97* 94*  CO2 23 20*  GLUCOSE 106* 142*  BUN 10 23*  CREATININE 0.95 1.89*  CALCIUM 9.6 8.5*   GFR: CrCl cannot be calculated (Unknown ideal weight.). Liver Function Tests: Recent Labs  Lab 04/01/18 1707 04/03/18 1654  AST 23 28  ALT 16 17  ALKPHOS 54 65  BILITOT 0.6 0.7  PROT 7.3 7.0  ALBUMIN 3.7 3.2*   Recent Labs  Lab 04/01/18 1707 04/03/18 1654  LIPASE 21 19   Urine analysis:    Component Value Date/Time   COLORURINE YELLOW 07/27/2017 1259   APPEARANCEUR CLEAR 07/27/2017 1259   LABSPEC 1.008 07/27/2017 1259   PHURINE 7.0 07/27/2017 1259   GLUCOSEU NEGATIVE 07/27/2017 1259   HGBUR NEGATIVE 07/27/2017 1259   BILIRUBINUR NEGATIVE 07/27/2017 1259   KETONESUR NEGATIVE 07/27/2017 1259   PROTEINUR NEGATIVE 07/27/2017 1259   NITRITE NEGATIVE 07/27/2017 1259   LEUKOCYTESUR NEGATIVE 07/27/2017 1259    Radiological Exams on Admission: Ct Abdomen Pelvis Wo Contrast  Result Date: 04/03/2018 CLINICAL DATA:  Diarrhea for 1 week. Seen in ED a few days ago. Increased weakness and fever. EXAM: CT ABDOMEN AND PELVIS WITHOUT CONTRAST TECHNIQUE: Multidetector CT imaging of the abdomen and pelvis was performed following the standard protocol without IV contrast. COMPARISON:  07/27/2017 FINDINGS: Lower chest: Linear atelectasis in the lung bases. Small air cysts on the right. Coronary artery calcifications. Hepatobiliary: No focal liver abnormality is seen. Status post cholecystectomy. No biliary dilatation. Pancreas: Unremarkable. No pancreatic ductal dilatation or surrounding inflammatory changes. Spleen: Dense calcification in the splenic hilum may represent dystrophic calcification, granuloma, or a calcified splenic artery aneurysm. No change since prior study. Spleen size is normal. No  focal lesions identified on noncontrast imaging. Adrenals/Urinary Tract: Adrenal glands are unremarkable. Kidneys are normal, without renal calculi, focal lesion, or  hydronephrosis. Bladder is unremarkable. Stomach/Bowel: Stomach, small bowel, and colon are not abnormally distended. There is diffuse wall thickening throughout the colon with pericolonic fat stranding throughout. This is consistent with pancolitis and could represent pseudomembranous colitis or inflammatory bowel disease. Infectious colitis also a possibility but probably less likely. The appendix is not identified. Vascular/Lymphatic: Aortic atherosclerosis. No enlarged abdominal or pelvic lymph nodes. Reproductive: Uterus and bilateral adnexa are unremarkable. Other: No abdominal wall hernia or abnormality. No abdominopelvic ascites. Musculoskeletal: Laminectomies with posterior fixation and intervertebral fusion from L3-L5. No destructive bone lesions. IMPRESSION: 1. Diffuse thickening of the colon wall with pericolonic fat stranding likely representing pseudomembranous colitis or inflammatory bowel disease. Infectious colitis probably less likely. 2. Linear atelectasis in the lung bases. 3. Aortic atherosclerosis. Electronically Signed   By: Lucienne Capers M.D.   On: 04/03/2018 21:55    EKG: None.  Assessment/Plan Principal Problem:   AKI (acute kidney injury) ([place]) Active Problems:   HTN (hypertension)   Asthma   Pseudomembranous colitis   Colitis- diarrhea, lower abdominal pain.  Pseudomembranous colitis versus IBD suggested on CT abd. WBC- 21, with AKI. Fever at home. Recent antibiotic exposure- azithromycin and ?name. -Stool C. difficile antigen positive, reflex PCR pending -GI pathogen panel ordered pending -Start p.o. Vancomycin -Will start IV levaquin per pharm for colitis, discont/narrow antibiotics pending C. difficile PCR ( penicillin allergy noted, hence zosyn not used) - blood cultures x 2  - CBC a.m - Tramadol -  Hold home PPi  Acute kidney injury-Cr- 1.89, baseline 0.9-1. Likely prerenal from diarrhea, with on-going ARB use.  Hypotension. 1L bolus given in ED. - Hydrate Ns + 20Kcl 100cc/hr X 12 hrs - BMp a.m  HTN- hypotensive blood pressure systolic 62B to 762. -Hold home medications ARB,   Asthma- stable - cont home bronchodils   DVT prophylaxis: Lovenox Code Status: Assume full for now, patient doesnt seem to understand what it means.  Daughter- Hoyle Sauer is Air traffic controller. Family Communication: None at bedside. Disposition Plan: Per rounding team Consults called: None Admission status: inpt, tele   Bethena Roys MD [facility] Pager [phone] From 6PM-2AM.  Otherwise please contact night-coverage [email or web address] Password [facility]  04/03/2018, 10:31 PM

## 2018-04-03 NOTE — ED Provider Notes (Signed)
[facility] DEPT Provider Note   CSN: [id number] Arrival date & time: 04/03/18  1632     History   Chief Complaint Chief Complaint  Patient presents with  . Diarrhea  . Abdominal Pain    HPI Beverly Jimenez is a 84 y.o. female.  He has been having diarrhea since a week ago.  The been daily nonbloody.  She was here on the 28th and no specific diagnosis was given no treatment was given.  She called her primary care doctor yesterday and he prescribed her Flagyl and saw her in the office today did some blood work and sent her to the ED for evaluation and probable admission.  His concern was diverticulitis.  She has had a fever since yesterday to T-max of 101.  She still had a pretty good appetite but has been feeling generally fatigued.  Here in triage when standing her blood pressure dropped to 79/44 so she was brought into an acute care bed.  She denies any chest pain shortness of breath.  There is some vague diffuse abdominal pain.  There is no urinary symptoms.  She did have dental work done last month and was on 2 courses of antibiotics.  Currently she is still on Flagyl that started yesterday.  The history is provided by the patient and a relative.  Diarrhea   This is a new problem. The current episode started more than 1 week ago. The problem occurs 5 to 10 times per day. The problem has been gradually improving. The stool consistency is described as watery. The maximum temperature recorded prior to her arrival was 101 to 101.9 F. Associated symptoms include abdominal pain and vomiting. Pertinent negatives include no chills, no sweats, no arthralgias, no myalgias, no URI and no cough. She has tried nothing for the symptoms. The treatment provided no relief.  Abdominal Pain   This is a new problem. The current episode started more than 1 week ago. The problem occurs constantly. The problem has been gradually improving. The pain is associated with eating. The  pain is located in the generalized abdominal region. The quality of the pain is cramping. The pain is moderate. Associated symptoms include fever, diarrhea, nausea and vomiting. Pertinent negatives include dysuria, hematuria, arthralgias and myalgias.    Past Medical History:  Diagnosis Date  . Anemia   . Anxiety   . Arthritis    shoulder and knee pain  . Asthma   . Asthma   . Constipation   . Depression    Hx; of situational depression  . GERD (gastroesophageal reflux disease)   . Glaucoma    Hx; of  . High cholesterol   . History of hiatal hernia   . Myalgia   . Numbness and tingling    Hx: of B/LLE  . Osteoporosis   . Pneumonia    Hx: of "years ago"  . Shortness of breath     Patient Active Problem List   Diagnosis Date Noted  . Acute pain of left knee 10/25/2017  . Hemorrhage of rectum and anus 09/18/2017  . Chronic left-sided low back pain with left-sided sciatica 03/27/2017  . Influenza 01/17/2017  . Influenza A 01/16/2017  . HTN (hypertension) 01/16/2017  . Microcytic anemia 01/16/2017  . Acute hyponatremia 01/16/2017  . Asthma 01/16/2017  . UTI (urinary tract infection) 01/16/2017  . Anemia   . Respiratory distress   . Neck pain 01/11/2017  . Acute pain of left shoulder 01/11/2017  .  Left leg pain 01/06/2015  . Varicose veins of both lower extremities with complications 95/28/4132  . HNP (herniated nucleus pulposus), lumbar 07/05/2013    Past Surgical History:  Procedure Laterality Date  . APPENDECTOMY    . BACK SURGERY    . CHOLECYSTECTOMY    . COLONOSCOPY     Hx: of  . COLONOSCOPY WITH PROPOFOL N/A 09/18/2017   Procedure: COLONOSCOPY WITH PROPOFOL;  Surgeon: Wilford Corner, MD;  Location: [facility];  Service: Endoscopy;  Laterality: N/A;  . EYE SURGERY     catarac  . 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 HERNIA REPAIR    . LUMBAR LAMINECTOMY/DECOMPRESSION MICRODISCECTOMY Left 07/05/2013   Procedure: LUMBAR LAMINECTOMY/DECOMPRESSION  MICRODISCECTOMY 1 LEVEL Left Lumbar five-Sacral one;  Surgeon: Charlie Pitter, MD;  Location: [facility];  Service: Neurosurgery;  Laterality: Left;  Left Lumbar Five-Sacral One Microdiskectomy  . VASCULAR SURGERY     varicose vein surgery     OB History   None      Home Medications    Prior to Admission medications   Medication Sig Start Date End Date Taking? Authorizing Provider  acetaminophen (TYLENOL) 500 MG tablet Take 1-2 tablets (500-1,000 mg total) by mouth every 8 (eight) hours as needed for mild pain, moderate pain, fever or headache. 09/18/17   Wilford Corner, MD  albuterol (PROVENTIL HFA;VENTOLIN HFA) 108 (90 Base) MCG/ACT inhaler Inhale 2 puffs into the lungs every 4 (four) hours as needed for wheezing or shortness of breath. 01/18/17   GhimireHenreitta Leber, MD  aspirin EC 81 MG tablet Take 81 mg by mouth daily.    [provider]  bimatoprost (LUMIGAN) 0.01 % SOLN Place 1 drop into both eyes at bedtime.    [provider]  Calcium Carbonate-Vit D-Min (CALTRATE PLUS PO) Take 2 tablets by mouth 2 (two) times daily.     [provider]  Cyanocobalamin (VITAMIN B-12 PO) Place 1 tablet under the tongue daily.    [provider]  dexlansoprazole (DEXILANT) 60 MG capsule Take 60 mg by mouth daily.    [provider]  fish oil-omega-3 fatty acids 1000 MG capsule Take 1 g by mouth 2 (two) times daily.     [provider]  fluticasone (FLONASE) 50 MCG/ACT nasal spray Place 2 sprays into the nose at bedtime.     [provider]  Fluticasone-Salmeterol (ADVAIR) 100-50 MCG/DOSE AEPB Inhale 1 puff into the lungs 2 (two) times daily.    [provider]  guaiFENesin (MUCINEX) 600 MG 12 hr tablet Take 1 tablet (600 mg total) by mouth 2 (two) times daily. Patient taking differently: Take 600 mg by mouth daily as needed for cough or to loosen phlegm.  01/18/17   Ghimire, Henreitta Leber, MD  irbesartan (AVAPRO) 150 MG tablet Take  150 mg by mouth daily.    [provider]  niacin 500 MG tablet Take 500 mg by mouth daily with breakfast.    [provider]  Potassium 99 MG TABS Take 99 mg by mouth daily.    [provider]  sodium chloride (OCEAN) 0.65 % SOLN nasal spray Place 1 spray into both nostrils as needed for congestion.    [provider]  traMADol (ULTRAM) 50 MG tablet Take 50 mg by mouth every 6 (six) hours as needed for moderate pain or severe pain.     [provider]  vitamin C (ASCORBIC ACID) 500 MG tablet Take 500  mg by mouth daily.    [provider]  vitamin E 400 UNIT capsule Take 400 Units by mouth daily.    [provider]    Family History Family History  Problem Relation Age of Onset  . Varicose Veins Mother   . Diabetes Brother   . Heart disease Brother   . Hypertension Brother   . Varicose Veins Brother   . Hypertension Other   . Heart disease Other   . Glaucoma Other   . Diabetes Other   . Varicose Veins Sister   . Varicose Veins Daughter     Social History Social History   Tobacco Use  . Smoking status: Never Smoker  . Smokeless tobacco: Never Used  Substance Use Topics  . Alcohol use: No  . Drug use: No     Allergies   Amoxicillin; Macrodantin [nitrofurantoin macrocrystal]; and Sulfa antibiotics   Review of Systems Review of Systems  Constitutional: Positive for fever. Negative for chills.  HENT: Negative for ear pain and sore throat.   Eyes: Negative for pain and visual disturbance.  Respiratory: Negative for cough and shortness of breath.   Cardiovascular: Negative for chest pain and palpitations.  Gastrointestinal: Positive for abdominal pain, diarrhea, nausea and vomiting. Negative for anal bleeding and blood in stool.  Genitourinary: Negative for dysuria and hematuria.  Musculoskeletal: Negative for arthralgias, back pain and myalgias.  Skin: Negative for color change and rash.  Neurological:  Negative for seizures and syncope.  All other systems reviewed and are negative.    Physical Exam Updated Vital Signs BP (!) 100/46   Pulse 89   Temp 98.9 F (37.2 C) (Oral)   Resp 20   SpO2 97%   Physical Exam  Constitutional: She appears well-developed and well-nourished. No distress.  HENT:  Head: Normocephalic and atraumatic.  Eyes: Conjunctivae are normal.  Neck: Neck supple.  Cardiovascular: Normal rate and regular rhythm.  No murmur heard. Pulmonary/Chest: Effort normal and breath sounds normal. No respiratory distress.  Abdominal: Soft. Normal appearance. She exhibits no mass. There is generalized tenderness (mild). There is no rigidity and no guarding.  Musculoskeletal: She exhibits no edema, tenderness or deformity.  Neurological: She is alert. She exhibits normal muscle tone.  Skin: Skin is warm and dry. Capillary refill takes less than 2 seconds.  Psychiatric: She has a normal mood and affect.  Nursing note and vitals reviewed.    ED Treatments / Results  Labs (all labs ordered are listed, but only abnormal results are displayed) Labs Reviewed  C DIFFICILE QUICK SCREEN W PCR REFLEX - Abnormal; Notable for the following components:      Result Value   C Diff antigen POSITIVE (*)    All other components within normal limits  CLOSTRIDIUM DIFFICILE BY PCR, REFLEXED - Abnormal; Notable for the following components:   Toxigenic C. Difficile by PCR POSITIVE (*)    All other components within normal limits  COMPREHENSIVE METABOLIC PANEL - Abnormal; Notable for the following components:   Sodium 126 (*)    Chloride 94 (*)    CO2 20 (*)    Glucose, Bld 142 (*)    BUN 23 (*)    Creatinine, Ser 1.89 (*)    Calcium 8.5 (*)    Albumin 3.2 (*)    GFR calc non Af Amer 22 (*)    GFR calc Af Amer 26 (*)    All other components within normal limits  CBC - Abnormal; Notable for the following  components:   WBC 21.7 (*)    RBC 3.71 (*)    Hemoglobin 9.7 (*)    HCT  29.4 (*)    All other components within normal limits  MAGNESIUM - Abnormal; Notable for the following components:   Magnesium 1.5 (*)    All other components within normal limits  BASIC METABOLIC PANEL - Abnormal; Notable for the following components:   Sodium 126 (*)    Potassium 3.2 (*)    Chloride 99 (*)    CO2 18 (*)    Glucose, Bld 107 (*)    BUN 23 (*)    Creatinine, Ser 1.32 (*)    Calcium 7.1 (*)    GFR calc non Af Amer 34 (*)    GFR calc Af Amer 40 (*)    All other components within normal limits  CBC - Abnormal; Notable for the following components:   WBC 19.4 (*)    RBC 2.87 (*)    Hemoglobin 7.7 (*)    HCT 22.7 (*)    All other components within normal limits  CBC - Abnormal; Notable for the following components:   WBC 24.9 (*)    RBC 3.82 (*)    Hemoglobin 10.3 (*)    HCT 30.5 (*)    All other components within normal limits  GASTROINTESTINAL PANEL BY PCR, STOOL (REPLACES STOOL CULTURE)  CULTURE, BLOOD (ROUTINE X 2)  CULTURE, BLOOD (ROUTINE X 2)  LIPASE, BLOOD  TYPE AND SCREEN  PREPARE RBC (CROSSMATCH)  ABO/RH    EKG None  Radiology Ct Abdomen Pelvis Wo Contrast  Result Date: 04/03/2018 CLINICAL DATA:  Diarrhea for 1 week. Seen in ED a few days ago. Increased weakness and fever. EXAM: CT ABDOMEN AND PELVIS WITHOUT CONTRAST TECHNIQUE: Multidetector CT imaging of the abdomen and pelvis was performed following the standard protocol without IV contrast. COMPARISON:  07/27/2017 FINDINGS: Lower chest: Linear atelectasis in the lung bases. Small air cysts on the right. Coronary artery calcifications. Hepatobiliary: No focal liver abnormality is seen. Status post cholecystectomy. No biliary dilatation. Pancreas: Unremarkable. No pancreatic ductal dilatation or surrounding inflammatory changes. Spleen: Dense calcification in the splenic hilum may represent dystrophic calcification, granuloma, or a calcified splenic artery aneurysm. No change since prior study. Spleen  size is normal. No focal lesions identified on noncontrast imaging. Adrenals/Urinary Tract: Adrenal glands are unremarkable. Kidneys are normal, without renal calculi, focal lesion, or hydronephrosis. Bladder is unremarkable. Stomach/Bowel: Stomach, small bowel, and colon are not abnormally distended. There is diffuse wall thickening throughout the colon with pericolonic fat stranding throughout. This is consistent with pancolitis and could represent pseudomembranous colitis or inflammatory bowel disease. Infectious colitis also a possibility but probably less likely. The appendix is not identified. Vascular/Lymphatic: Aortic atherosclerosis. No enlarged abdominal or pelvic lymph nodes. Reproductive: Uterus and bilateral adnexa are unremarkable. Other: No abdominal wall hernia or abnormality. No abdominopelvic ascites. Musculoskeletal: Laminectomies with posterior fixation and intervertebral fusion from L3-L5. No destructive bone lesions. IMPRESSION: 1. Diffuse thickening of the colon wall with pericolonic fat stranding likely representing pseudomembranous colitis or inflammatory bowel disease. Infectious colitis probably less likely. 2. Linear atelectasis in the lung bases. 3. Aortic atherosclerosis. Electronically Signed   By: Lucienne Capers M.D.   On: 04/03/2018 21:55    Procedures Procedures (including critical care time)  Medications Ordered in ED Medications  sodium chloride 0.9 % bolus 1,000 mL (has no administration in time range)     Initial Impression / Assessment and Plan / ED  Course  I have reviewed the triage vital signs and the nursing notes.  Pertinent labs & imaging results that were available during my care of the patient were reviewed by me and considered in my medical decision making (see chart for details).  Clinical Course as of Apr 04 1724  Tue Apr 03, 2018  2227 discussed with the medicine hospitalist Dr. Denton Brick will evaluate the patient in the ED for admission.   [MB]      Clinical Course User Index [MB] Hayden Rasmussen, MD     Final Clinical Impressions(s) / ED Diagnoses   Final diagnoses:  Colitis, acute  AKI (acute kidney injury) [facility])  Hyponatremia    ED Discharge Orders    None       Hayden Rasmussen, MD 04/04/18 1726

## 2018-04-04 ENCOUNTER — Other Ambulatory Visit: Payer: Self-pay

## 2018-04-04 DIAGNOSIS — I959 Hypotension, unspecified: Secondary | ICD-10-CM | POA: Diagnosis present

## 2018-04-04 DIAGNOSIS — N179 Acute kidney failure, unspecified: Secondary | ICD-10-CM

## 2018-04-04 DIAGNOSIS — D72829 Elevated white blood cell count, unspecified: Secondary | ICD-10-CM | POA: Diagnosis present

## 2018-04-04 DIAGNOSIS — E876 Hypokalemia: Secondary | ICD-10-CM | POA: Diagnosis present

## 2018-04-04 DIAGNOSIS — A0472 Enterocolitis due to Clostridium difficile, not specified as recurrent: Secondary | ICD-10-CM | POA: Diagnosis present

## 2018-04-04 LAB — C DIFFICILE QUICK SCREEN W PCR REFLEX
C Diff antigen: POSITIVE — AB
C Diff toxin: NEGATIVE

## 2018-04-04 LAB — CBC
HCT: 22.7 % — ABNORMAL LOW (ref 36.0–46.0)
HEMATOCRIT: 30.5 % — AB (ref 36.0–46.0)
HEMOGLOBIN: 10.3 g/dL — AB (ref 12.0–15.0)
Hemoglobin: 7.7 g/dL — ABNORMAL LOW (ref 12.0–15.0)
MCH: 26.8 pg (ref 26.0–34.0)
MCH: 27 pg (ref 26.0–34.0)
MCHC: 33.9 g/dL (ref 30.0–36.0)
MCHC: 33.8 g/dL (ref 30.0–36.0)
MCV: 79.1 fL (ref 78.0–100.0)
MCV: 79.8 fL (ref 78.0–100.0)
PLATELETS: 214 10*3/uL (ref 150–400)
Platelets: 220 10*3/uL (ref 150–400)
RBC: 2.87 MIL/uL — ABNORMAL LOW (ref 3.87–5.11)
RBC: 3.82 MIL/uL — AB (ref 3.87–5.11)
RDW: 13.7 % (ref 11.5–15.5)
RDW: 14.1 % (ref 11.5–15.5)
WBC: 19.4 10*3/uL — ABNORMAL HIGH (ref 4.0–10.5)
WBC: 24.9 10*3/uL — AB (ref 4.0–10.5)

## 2018-04-04 LAB — GASTROINTESTINAL PANEL BY PCR, STOOL (REPLACES STOOL CULTURE)
ADENOVIRUS F40/41: NOT DETECTED
ASTROVIRUS: NOT DETECTED
CYCLOSPORA CAYETANENSIS: NOT DETECTED
Campylobacter species: NOT DETECTED
Cryptosporidium: NOT DETECTED
ENTEROPATHOGENIC E COLI (EPEC): NOT DETECTED
ENTEROTOXIGENIC E COLI (ETEC): NOT DETECTED
Entamoeba histolytica: NOT DETECTED
Enteroaggregative E coli (EAEC): NOT DETECTED
Giardia lamblia: NOT DETECTED
NOROVIRUS GI/GII: NOT DETECTED
PLESIMONAS SHIGELLOIDES: NOT DETECTED
ROTAVIRUS A: NOT DETECTED
Salmonella species: NOT DETECTED
Sapovirus (I, II, IV, and V): NOT DETECTED
Shiga like toxin producing E coli (STEC): NOT DETECTED
Shigella/Enteroinvasive E coli (EIEC): NOT DETECTED
VIBRIO SPECIES: NOT DETECTED
Vibrio cholerae: NOT DETECTED
Yersinia enterocolitica: NOT DETECTED

## 2018-04-04 LAB — BASIC METABOLIC PANEL
Anion gap: 9 (ref 5–15)
BUN: 23 mg/dL — AB (ref 6–20)
CHLORIDE: 99 mmol/L — AB (ref 101–111)
CO2: 18 mmol/L — AB (ref 22–32)
CREATININE: 1.32 mg/dL — AB (ref 0.44–1.00)
Calcium: 7.1 mg/dL — ABNORMAL LOW (ref 8.9–10.3)
GFR calc Af Amer: 40 mL/min — ABNORMAL LOW (ref >60)
GFR calc non Af Amer: 34 mL/min — ABNORMAL LOW (ref >60)
Glucose, Bld: 107 mg/dL — ABNORMAL HIGH (ref 65–99)
Potassium: 3.2 mmol/L — ABNORMAL LOW (ref 3.5–5.1)
SODIUM: 126 mmol/L — AB (ref 135–145)

## 2018-04-04 LAB — ABO/RH: ABO/RH(D): A POS

## 2018-04-04 LAB — CLOSTRIDIUM DIFFICILE BY PCR, REFLEXED: Toxigenic C. Difficile by PCR: POSITIVE — AB

## 2018-04-04 LAB — PREPARE RBC (CROSSMATCH)

## 2018-04-04 LAB — MAGNESIUM: Magnesium: 1.5 mg/dL — ABNORMAL LOW (ref 1.7–2.4)

## 2018-04-04 MED ORDER — POTASSIUM CHLORIDE IN NACL 20-0.9 MEQ/L-% IV SOLN
INTRAVENOUS | Status: AC
  Administered 2018-04-04 (×2): via INTRAVENOUS
  Filled 2018-04-04 (×2): qty 1000

## 2018-04-04 MED ORDER — LEVOFLOXACIN IN D5W 500 MG/100ML IV SOLN: 500.0000 mg | INTRAVENOUS | Status: DC

## 2018-04-04 MED ORDER — SODIUM CHLORIDE 0.9 % IV SOLN
Freq: Once | INTRAVENOUS | Status: AC
Start: 2018-04-04 — End: 2018-04-04
  Administered 2018-04-04: 10:00:00 via INTRAVENOUS

## 2018-04-04 MED ORDER — SODIUM CHLORIDE 0.9 % IV BOLUS
1000.0000 mL | Freq: Once | INTRAVENOUS | Status: AC
  Administered 2018-04-04: 1000 mL via INTRAVENOUS

## 2018-04-04 NOTE — Progress Notes (Signed)
Pharmacy Antibiotic Note  Beverly Jimenez is a 84 y.o. female admitted on 04/03/2018 with intra-abdominal infection.  Pharmacy has been consulted for levaquin dosing.  Plan: Levaquin 750 mg x1 then 500 mg IV q48h F/u scr/cultures  Weight: 175 lb (79.4 kg)  Temp (24hrs), Avg:99.2 F (37.3 C), Min:98.9 F (37.2 C), Max:99.7 F (37.6 C)  Recent Labs  Lab 04/01/18 1707 04/03/18 1654  WBC 6.3 21.7*  CREATININE 0.95 1.89*    Estimated Creatinine Clearance: 20.2 mL/min (A) (by C-G formula based on SCr of 1.89 mg/dL (H)).    Allergies  Allergen Reactions  . Amoxicillin Hives, Itching and Rash    Has patient had a PCN reaction causing immediate rash, facial/tongue/throat swelling, SOB or lightheadedness with hypotension: Unknown Has patient had a PCN reaction causing severe rash involving mucus membranes or skin necrosis: Yes  Has patient had a PCN reaction that required hospitalization: No Has patient had a PCN reaction occurring within the last 10 years: No  If all of the above answers are "NO", then may proceed with Cephalosporin use.   Clancy Gourd [Nitrofurantoin Macrocrystal] Shortness Of Breath  . Sulfa Antibiotics Rash    Shortness of breath    Antimicrobials this admission: 4/30 levaquin >>    >>   Dose adjustments this admission:   Microbiology results:  BCx:   UCx:    Sputum:    MRSA PCR:   Thank you for allowing pharmacy to be a part of this patient's care.  Dorrene German 04/04/2018 12:38 AM

## 2018-04-04 NOTE — Progress Notes (Signed)
Patient ID: Beverly Jimenez, female   DOB: 10/29/1925, 84 y.o.   MRN: [id number]  PROGRESS NOTE    SWEDEN LESURE  NWG:[id number] DOB: October 17, 1925 DOA: 04/03/2018 PCP: Marton Redwood, MD   Outpatient Specialists: None   Brief Narrative: Beverly Jimenez is a 84 y.o. female with medical history significant for asthma, HTN.  Patient presented to the ED with complaints of multiple episodes of nonbloody loose stools and lower abdominal pain of one-week duration.  She reports fevers up to 101 at home today. Patient was seen 04/01/18, 2 days ago for similar symptoms but at that time blood work was unrevealing, and patient was discharged to home.  Patient subsequently followed up with her primary care provider , blood work revealed elevated WBC, ED evaluation was recommended for CT imaging.   Patient had dental work-tooth extraction, 3 weeks ago, for which she was prescribed 2 antibiotics, 1 of which was azithromycin, ?name of 2nd one. She has now had a workup showing CDiff positive.  Assessment & Plan:   Principal Problem:   AKI (acute kidney injury) ([place]) Active Problems:   HTN (hypertension)   Acute hyponatremia   Asthma   Pseudomembranous colitis   Hypokalemia   Hypotension   Leucocytosis   #1 C. Difficile colitis: This seems to be the main cause of patient's symptoms. She is on oral vancomycin at the moment. We're continuing with treatment as well as supportive care. Discontinue Levaquin  #2 acute kidney injury: Secondary to prerenal causes from excessive diarrhea. Monitor renal function and continue hydration  #3 hypotension: Patient is hypotensive today. Most likely due to dehydration. Increase IV fluids to 1 25 mL an hour and monitor.  #4 acute hyponatremia: Most likely related to dehydration. Continue monitoring  #5 history of asthma: No acute exacerbation. Continue to monitor  #6 leukocytosis: Most likely secondary to C. Difficile colitis. Monitor white count   DVT prophylaxis:  lovenox  Code Status: Full Family Communication: None around Disposition Plan: To be determined  Consultants:   None  Procedures: None   Antimicrobials:   Oral vancomycin day 1/10    Subjective: Patient is still having significant stool with abdominal pain. She is tolerating clear liquid diet. No bleeding.  Objective: Vitals:   04/03/18 2352 04/04/18 0037 04/04/18 0459 04/04/18 0619  BP: (!) 99/51  (!) 82/42 (!) 88/40  Pulse: 76  92 88  Resp: 20  18   Temp: 98.9 F (37.2 C)  99.6 F (37.6 C)   TempSrc:      SpO2: 99%  98%   Weight:  79.4 kg (175 lb)      Intake/Output Summary (Last 24 hours) at 04/04/2018 0824 Last data filed at 04/04/2018 0622 Gross per 24 hour  Intake 2446.33 ml  Output -  Net 2446.33 ml   Filed Weights   04/04/18 0037  Weight: 79.4 kg (175 lb)    Examination:  General exam: Appears calm and comfortable  Respiratory system: Clear to auscultation. Respiratory effort normal. Cardiovascular system: S1 & S2 heard, RRR. No JVD, murmurs, rubs, gallops or clicks. No pedal edema. Gastrointestinal system: Abdomen is nondistended, soft and nontender. No organomegaly or masses felt. Normal bowel sounds heard. Central nervous system: Alert and oriented. No focal neurological deficits. Extremities: Symmetric 5 x 5 power. Skin: No rashes, lesions or ulcers Psychiatry: Judgement and insight appear normal. Mood & affect appropriate.     Data Reviewed: I have personally reviewed following labs and imaging studies  CBC: Recent Labs  Lab 04/01/18 1707 04/03/18 1654 04/04/18 0616  WBC 6.3 21.7* 19.4*  HGB 9.0* 9.7* 7.7*  HCT 28.1* 29.4* 22.7*  MCV 81.2 79.2 79.1  PLT 265 285 702   Basic Metabolic Panel: Recent Labs  Lab 04/01/18 1707 04/03/18 1654 04/04/18 0006 04/04/18 0616  NA 130* 126*  --  126*  K 3.9 3.6  --  3.2*  CL 97* 94*  --  99*  CO2 23 20*  --  18*  GLUCOSE 106* 142*  --  107*  BUN 10 23*  --  23*  CREATININE 0.95 1.89*  --   1.32*  CALCIUM 9.6 8.5*  --  7.1*  MG  --   --  1.5*  --    GFR: Estimated Creatinine Clearance: 28.9 mL/min (A) (by C-G formula based on SCr of 1.32 mg/dL (H)). Liver Function Tests: Recent Labs  Lab 04/01/18 1707 04/03/18 1654  AST 23 28  ALT 16 17  ALKPHOS 54 65  BILITOT 0.6 0.7  PROT 7.3 7.0  ALBUMIN 3.7 3.2*   Recent Labs  Lab 04/01/18 1707 04/03/18 1654  LIPASE 21 19   No results for input(s): AMMONIA in the last 168 hours. Coagulation Profile: No results for input(s): INR, PROTIME in the last 168 hours. Cardiac Enzymes: No results for input(s): CKTOTAL, CKMB, CKMBINDEX, TROPONINI in the last 168 hours. BNP (last 3 results) No results for input(s): PROBNP in the last 8760 hours. HbA1C: No results for input(s): HGBA1C in the last 72 hours. CBG: No results for input(s): GLUCAP in the last 168 hours. Lipid Profile: No results for input(s): CHOL, HDL, LDLCALC, TRIG, CHOLHDL, LDLDIRECT in the last 72 hours. Thyroid Function Tests: No results for input(s): TSH, T4TOTAL, FREET4, T3FREE, THYROIDAB in the last 72 hours. Anemia Panel: No results for input(s): VITAMINB12, FOLATE, FERRITIN, TIBC, IRON, RETICCTPCT in the last 72 hours. Urine analysis:    Component Value Date/Time   COLORURINE YELLOW 07/27/2017 [place] 07/27/2017 1259   LABSPEC 1.008 07/27/2017 1259   PHURINE 7.0 07/27/2017 1259   GLUCOSEU NEGATIVE 07/27/2017 1259   HGBUR NEGATIVE 07/27/2017 1259   BILIRUBINUR NEGATIVE 07/27/2017 1259   KETONESUR NEGATIVE 07/27/2017 1259   PROTEINUR NEGATIVE 07/27/2017 1259   NITRITE NEGATIVE 07/27/2017 1259   LEUKOCYTESUR NEGATIVE 07/27/2017 1259   Sepsis Labs: @LABRCNTIP (procalcitonin:4,lacticidven:4)  ) Recent Results (from the past 240 hour(s))  C difficile quick scan w PCR reflex     Status: Abnormal   Collection Time: 04/03/18 11:05 PM  Result Value Ref Range Status   C Diff antigen POSITIVE (A) NEGATIVE Final   C Diff toxin NEGATIVE  NEGATIVE Final   C Diff interpretation Results are indeterminate. See PCR results.  Final    Comment: Performed at [facility], [place] [address], [place], [place] [address]  C. Diff by PCR, Reflexed     Status: Abnormal   Collection Time: 04/03/18 11:05 PM  Result Value Ref Range Status   Toxigenic C. Difficile by PCR POSITIVE (A) NEGATIVE Final    Comment: Positive for toxigenic C. difficile with little to no toxin production. Only treat if clinical presentation suggests symptomatic illness. Performed at [facility], [place] [address], [place],  [address]          Radiology Studies: Ct Abdomen Pelvis Wo Contrast  Result Date: 04/03/2018 CLINICAL DATA:  Diarrhea for 1 week. Seen in ED a few days ago. Increased weakness and fever. EXAM: CT ABDOMEN AND PELVIS WITHOUT  CONTRAST TECHNIQUE: Multidetector CT imaging of the abdomen and pelvis was performed following the standard protocol without IV contrast. COMPARISON:  07/27/2017 FINDINGS: Lower chest: Linear atelectasis in the lung bases. Small air cysts on the right. Coronary artery calcifications. Hepatobiliary: No focal liver abnormality is seen. Status post cholecystectomy. No biliary dilatation. Pancreas: Unremarkable. No pancreatic ductal dilatation or surrounding inflammatory changes. Spleen: Dense calcification in the splenic hilum may represent dystrophic calcification, granuloma, or a calcified splenic artery aneurysm. No change since prior study. Spleen size is normal. No focal lesions identified on noncontrast imaging. Adrenals/Urinary Tract: Adrenal glands are unremarkable. Kidneys are normal, without renal calculi, focal lesion, or hydronephrosis. Bladder is unremarkable. Stomach/Bowel: Stomach, small bowel, and colon are not abnormally distended. There is diffuse wall thickening throughout the colon with pericolonic fat stranding throughout. This is consistent with pancolitis and could represent  pseudomembranous colitis or inflammatory bowel disease. Infectious colitis also a possibility but probably less likely. The appendix is not identified. Vascular/Lymphatic: Aortic atherosclerosis. No enlarged abdominal or pelvic lymph nodes. Reproductive: Uterus and bilateral adnexa are unremarkable. Other: No abdominal wall hernia or abnormality. No abdominopelvic ascites. Musculoskeletal: Laminectomies with posterior fixation and intervertebral fusion from L3-L5. No destructive bone lesions. IMPRESSION: 1. Diffuse thickening of the colon wall with pericolonic fat stranding likely representing pseudomembranous colitis or inflammatory bowel disease. Infectious colitis probably less likely. 2. Linear atelectasis in the lung bases. 3. Aortic atherosclerosis. Electronically Signed   By: Lucienne Capers M.D.   On: 04/03/2018 21:55        Scheduled Meds: . aspirin EC  81 mg Oral Daily  . enoxaparin (LOVENOX) injection  30 mg Subcutaneous Q24H  . fluticasone  2 spray Each Nare QHS  . vancomycin  125 mg Oral QID   Continuous Infusions: . 0.9 % NaCl with KCl 20 mEq / L 100 mL/hr at 04/04/18 0144  . [START ON 04/05/2018] levofloxacin (LEVAQUIN) IV       LOS: 1 day    Time spent: 25 minutes    Marcas Bowsher,LAWAL, MD [facility] Pager [phone]  If 7PM-7AM, please contact night-coverage [email or web address] Password [facility] 04/04/2018, 8:24 AM

## 2018-04-05 LAB — BPAM RBC
Blood Product Expiration Date: 201905202359
Blood Product Expiration Date: 201905202359
ISSUE DATE / TIME: 201905011326
ISSUE DATE / TIME: 201905011037
Unit Type and Rh: 6200
Unit Type and Rh: 6200

## 2018-04-05 LAB — TYPE AND SCREEN
ABO/RH(D): A POS
Antibody Screen: NEGATIVE
Unit division: 0
Unit division: 0

## 2018-04-05 LAB — CBC WITH DIFFERENTIAL/PLATELET
BASOS ABS: 0 10*3/uL (ref 0.0–0.1)
BASOS PCT: 0 %
Eosinophils Absolute: 0.1 10*3/uL (ref 0.0–0.7)
Eosinophils Relative: 1 %
HEMATOCRIT: 29.2 % — AB (ref 36.0–46.0)
Hemoglobin: 9.8 g/dL — ABNORMAL LOW (ref 12.0–15.0)
LYMPHS PCT: 6 %
Lymphs Abs: 1.2 10*3/uL (ref 0.7–4.0)
MCH: 26.5 pg (ref 26.0–34.0)
MCHC: 33.6 g/dL (ref 30.0–36.0)
MCV: 78.9 fL (ref 78.0–100.0)
MONO ABS: 0.9 10*3/uL (ref 0.1–1.0)
Monocytes Relative: 4 %
NEUTROS ABS: 17.8 10*3/uL — AB (ref 1.7–7.7)
Neutrophils Relative %: 89 %
PLATELETS: 212 10*3/uL (ref 150–400)
RBC: 3.7 MIL/uL — AB (ref 3.87–5.11)
RDW: 14.4 % (ref 11.5–15.5)
WBC: 19.9 10*3/uL — ABNORMAL HIGH (ref 4.0–10.5)

## 2018-04-05 LAB — COMPREHENSIVE METABOLIC PANEL
ALBUMIN: 2.3 g/dL — AB (ref 3.5–5.0)
ALT: 15 U/L (ref 14–54)
AST: 22 U/L (ref 15–41)
Alkaline Phosphatase: 53 U/L (ref 38–126)
Anion gap: 7 (ref 5–15)
BILIRUBIN TOTAL: 0.7 mg/dL (ref 0.3–1.2)
BUN: 15 mg/dL (ref 6–20)
CHLORIDE: 107 mmol/L (ref 101–111)
CO2: 18 mmol/L — ABNORMAL LOW (ref 22–32)
CREATININE: 0.85 mg/dL (ref 0.44–1.00)
Calcium: 7.4 mg/dL — ABNORMAL LOW (ref 8.9–10.3)
GFR calc Af Amer: >60 mL/min (ref >60)
GFR calc non Af Amer: 59 mL/min — ABNORMAL LOW (ref >60)
GLUCOSE: 107 mg/dL — AB (ref 65–99)
POTASSIUM: 3.9 mmol/L (ref 3.5–5.1)
Sodium: 132 mmol/L — ABNORMAL LOW (ref 135–145)
Total Protein: 5.4 g/dL — ABNORMAL LOW (ref 6.5–8.1)

## 2018-04-05 MED ORDER — MOMETASONE FURO-FORMOTEROL FUM 100-5 MCG/ACT IN AERO
2.0000 | INHALATION_SPRAY | Freq: Two times a day (BID) | RESPIRATORY_TRACT | Status: DC
  Administered 2018-04-05 – 2018-04-09 (×8): 2 via RESPIRATORY_TRACT
  Filled 2018-04-05: qty 8.8

## 2018-04-05 MED ORDER — ENOXAPARIN SODIUM 40 MG/0.4ML ~~LOC~~ SOLN
40.0000 mg | SUBCUTANEOUS | Status: DC
  Administered 2018-04-05 – 2018-04-09 (×5): 40 mg via SUBCUTANEOUS
  Filled 2018-04-05 (×5): qty 0.4

## 2018-04-05 MED ORDER — LATANOPROST 0.005 % OP SOLN
1.0000 [drp] | Freq: Every day | OPHTHALMIC | Status: DC
  Administered 2018-04-05 – 2018-04-09 (×5): 1 [drp] via OPHTHALMIC
  Filled 2018-04-05: qty 2.5

## 2018-04-05 NOTE — Progress Notes (Signed)
Patient ID: Beverly Jimenez, female   DOB: August 20, 1925, 84 y.o.   MRN: [id number]  PROGRESS NOTE    Beverly Jimenez  IRJ:[id number] DOB: 07-13-1925 DOA: 04/03/2018 PCP: Marton Redwood, MD   Outpatient Specialists: None   Brief Narrative: Beverly Jimenez is a 84 y.o. female with medical history significant for asthma, HTN.  Patient presented to the ED with complaints of multiple episodes of nonbloody loose stools and lower abdominal pain of one-week duration.  She reports fevers up to 101 at home today. Patient was seen 04/01/18, 2 days ago for similar symptoms but at that time blood work was unrevealing, and patient was discharged to home.  Patient subsequently followed up with her primary care provider , blood work revealed elevated WBC, ED evaluation was recommended for CT imaging.   Patient had dental work-tooth extraction, 3 weeks ago, for which she was prescribed 2 antibiotics, 1 of which was azithromycin, ?name of 2nd one. She has now had a workup showing CDiff positive.  Assessment & Plan:   Principal Problem:   AKI (acute kidney injury) ([place]) Active Problems:   HTN (hypertension)   Acute hyponatremia   Asthma   Pseudomembranous colitis   Hypokalemia   Hypotension   Leucocytosis   #1 C. Difficile colitis: Patient still has Diarrhea up to 5 x this am but slowing down. Continue oral Vancomycin and IVF.   #2 acute kidney injury: Resolved. Secondary to prerenal causes from excessive diarrhea. Continue hydration  #3 hypotension: Resolved. May decrease IVF  #4 acute hyponatremia: Most likely related to dehydration. Recheck BMP Continue monitoring  #5 history of asthma: No acute exacerbation. Continue to monitor  #6 leukocytosis: Most likely secondary to C. Difficile colitis. Monitor white count   DVT prophylaxis: lovenox  Code Status: Full Family Communication: None around Disposition Plan: To be determined  Consultants:   None  Procedures: None   Antimicrobials:    Oral vancomycin day 1/10    Subjective: Patient is still having significant stool with abdominal pain. She is tolerating clear liquid diet. She has not been out of bed today.  Objective: Vitals:   04/04/18 1532 04/04/18 2233 04/05/18 0421 04/05/18 0600  BP: (!) 113/47 (!) 119/47 (!) 114/54   Pulse: 96 91 79   Resp: 18 18 18    Temp: 98.2 F (36.8 C) 98.9 F (37.2 C) 98.7 F (37.1 C)   TempSrc: Oral Oral Oral   SpO2: 98% 98% 99%   Weight:      Height:    5\' 4"  (1.626 m)    Intake/Output Summary (Last 24 hours) at 04/05/2018 0939 Last data filed at 04/04/2018 2330 Gross per 24 hour  Intake 949.17 ml  Output -  Net 949.17 ml   Filed Weights   04/04/18 0037  Weight: 79.4 kg (175 lb)    Examination:  General exam: Appears calm and comfortable  Respiratory system: Clear to auscultation. Respiratory effort normal. Cardiovascular system: S1 & S2 heard, RRR. No JVD, murmurs, rubs, gallops or clicks. No pedal edema. Gastrointestinal system: Abdomen is nondistended, soft and nontender. No organomegaly or masses felt. Normal bowel sounds heard. Central nervous system: Alert and oriented. No focal neurological deficits. Extremities: Symmetric 5 x 5 power. Skin: No rashes, lesions or ulcers Psychiatry: Judgement and insight appear normal. Mood & affect appropriate.     Data Reviewed: I have personally reviewed following labs and imaging studies  CBC: Recent Labs  Lab 04/01/18 1707 04/03/18 1654 04/04/18 3016 04/04/18 1636 04/05/18 0109  WBC 6.3 21.7* 19.4* 24.9* 19.9*  NEUTROABS  --   --   --   --  17.8*  HGB 9.0* 9.7* 7.7* 10.3* 9.8*  HCT 28.1* 29.4* 22.7* 30.5* 29.2*  MCV 81.2 79.2 79.1 79.8 78.9  PLT 265 285 214 220 025   Basic Metabolic Panel: Recent Labs  Lab 04/01/18 1707 04/03/18 1654 04/04/18 0006 04/04/18 0616 04/05/18 0620  NA 130* 126*  --  126* 132*  K 3.9 3.6  --  3.2* 3.9  CL 97* 94*  --  99* 107  CO2 23 20*  --  18* 18*  GLUCOSE 106* 142*   --  107* 107*  BUN 10 23*  --  23* 15  CREATININE 0.95 1.89*  --  1.32* 0.85  CALCIUM 9.6 8.5*  --  7.1* 7.4*  MG  --   --  1.5*  --   --    GFR: Estimated Creatinine Clearance: 44.9 mL/min (by C-G formula based on SCr of 0.85 mg/dL). Liver Function Tests: Recent Labs  Lab 04/01/18 1707 04/03/18 1654 04/05/18 0620  AST 23 28 22   ALT 16 17 15   ALKPHOS 54 65 53  BILITOT 0.6 0.7 0.7  PROT 7.3 7.0 5.4*  ALBUMIN 3.7 3.2* 2.3*   Recent Labs  Lab 04/01/18 1707 04/03/18 1654  LIPASE 21 19   No results for input(s): AMMONIA in the last 168 hours. Coagulation Profile: No results for input(s): INR, PROTIME in the last 168 hours. Cardiac Enzymes: No results for input(s): CKTOTAL, CKMB, CKMBINDEX, TROPONINI in the last 168 hours. BNP (last 3 results) No results for input(s): PROBNP in the last 8760 hours. HbA1C: No results for input(s): HGBA1C in the last 72 hours. CBG: No results for input(s): GLUCAP in the last 168 hours. Lipid Profile: No results for input(s): CHOL, HDL, LDLCALC, TRIG, CHOLHDL, LDLDIRECT in the last 72 hours. Thyroid Function Tests: No results for input(s): TSH, T4TOTAL, FREET4, T3FREE, THYROIDAB in the last 72 hours. Anemia Panel: No results for input(s): VITAMINB12, FOLATE, FERRITIN, TIBC, IRON, RETICCTPCT in the last 72 hours. Urine analysis:    Component Value Date/Time   COLORURINE YELLOW 07/27/2017 [place] 07/27/2017 1259   LABSPEC 1.008 07/27/2017 1259   PHURINE 7.0 07/27/2017 1259   GLUCOSEU NEGATIVE 07/27/2017 1259   HGBUR NEGATIVE 07/27/2017 1259   BILIRUBINUR NEGATIVE 07/27/2017 1259   [place] 07/27/2017 1259   PROTEINUR NEGATIVE 07/27/2017 1259   NITRITE NEGATIVE 07/27/2017 1259   LEUKOCYTESUR NEGATIVE 07/27/2017 1259   Sepsis Labs: @LABRCNTIP (procalcitonin:4,lacticidven:4)  ) Recent Results (from the past 240 hour(s))  Gastrointestinal Panel by PCR , Stool     Status: None   Collection Time: 04/03/18  11:05 PM  Result Value Ref Range Status   Campylobacter species NOT DETECTED NOT DETECTED Final   Plesimonas shigelloides NOT DETECTED NOT DETECTED Final   Salmonella species NOT DETECTED NOT DETECTED Final   Yersinia enterocolitica NOT DETECTED NOT DETECTED Final   Vibrio species NOT DETECTED NOT DETECTED Final   Vibrio cholerae NOT DETECTED NOT DETECTED Final   Enteroaggregative E coli (EAEC) NOT DETECTED NOT DETECTED Final   Enteropathogenic E coli (EPEC) NOT DETECTED NOT DETECTED Final   Enterotoxigenic E coli (ETEC) NOT DETECTED NOT DETECTED Final   Shiga like toxin producing E coli (STEC) NOT DETECTED NOT DETECTED Final   Shigella/Enteroinvasive E coli (EIEC) NOT DETECTED NOT DETECTED Final   Cryptosporidium NOT DETECTED NOT DETECTED Final   Cyclospora cayetanensis NOT DETECTED NOT DETECTED  Final   Entamoeba histolytica NOT DETECTED NOT DETECTED Final   Giardia lamblia NOT DETECTED NOT DETECTED Final   Adenovirus F40/41 NOT DETECTED NOT DETECTED Final   Astrovirus NOT DETECTED NOT DETECTED Final   Norovirus GI/GII NOT DETECTED NOT DETECTED Final   Rotavirus A NOT DETECTED NOT DETECTED Final   Sapovirus (I, II, IV, and V) NOT DETECTED NOT DETECTED Final    Comment: Performed at [facility], [address] [place], [place] [address]  C difficile quick scan w PCR reflex     Status: Abnormal   Collection Time: 04/03/18 11:05 PM  Result Value Ref Range Status   C Diff antigen POSITIVE (A) NEGATIVE Final   C Diff toxin NEGATIVE NEGATIVE Final   C Diff interpretation Results are indeterminate. See PCR results.  Final    Comment: Performed at [facility], [place] [address] [place], [place] [address]  C. Diff by PCR, Reflexed     Status: Abnormal   Collection Time: 04/03/18 11:05 PM  Result Value Ref Range Status   Toxigenic C. Difficile by PCR POSITIVE (A) NEGATIVE Final    Comment: Positive for toxigenic C. difficile with little to no toxin production.  Only treat if clinical presentation suggests symptomatic illness. Performed at [facility], [place] [address], [place], [place] [address]          Radiology Studies: Ct Abdomen Pelvis Wo Contrast  Result Date: 04/03/2018 CLINICAL DATA:  Diarrhea for 1 week. Seen in ED a few days ago. Increased weakness and fever. EXAM: CT ABDOMEN AND PELVIS WITHOUT CONTRAST TECHNIQUE: Multidetector CT imaging of the abdomen and pelvis was performed following the standard protocol without IV contrast. COMPARISON:  07/27/2017 FINDINGS: Lower chest: Linear atelectasis in the lung bases. Small air cysts on the right. Coronary artery calcifications. Hepatobiliary: No focal liver abnormality is seen. Status post cholecystectomy. No biliary dilatation. Pancreas: Unremarkable. No pancreatic ductal dilatation or surrounding inflammatory changes. Spleen: Dense calcification in the splenic hilum may represent dystrophic calcification, granuloma, or a calcified splenic artery aneurysm. No change since prior study. Spleen size is normal. No focal lesions identified on noncontrast imaging. Adrenals/Urinary Tract: Adrenal glands are unremarkable. Kidneys are normal, without renal calculi, focal lesion, or hydronephrosis. Bladder is unremarkable. Stomach/Bowel: Stomach, small bowel, and colon are not abnormally distended. There is diffuse wall thickening throughout the colon with pericolonic fat stranding throughout. This is consistent with pancolitis and could represent pseudomembranous colitis or inflammatory bowel disease. Infectious colitis also a possibility but probably less likely. The appendix is not identified. Vascular/Lymphatic: Aortic atherosclerosis. No enlarged abdominal or pelvic lymph nodes. Reproductive: Uterus and bilateral adnexa are unremarkable. Other: No abdominal wall hernia or abnormality. No abdominopelvic ascites. Musculoskeletal: Laminectomies with posterior fixation and intervertebral fusion from L3-L5.  No destructive bone lesions. IMPRESSION: 1. Diffuse thickening of the colon wall with pericolonic fat stranding likely representing pseudomembranous colitis or inflammatory bowel disease. Infectious colitis probably less likely. 2. Linear atelectasis in the lung bases. 3. Aortic atherosclerosis. Electronically Signed   By: Lucienne Capers M.D.   On: 04/03/2018 21:55        Scheduled Meds: . aspirin EC  81 mg Oral Daily  . enoxaparin (LOVENOX) injection  40 mg Subcutaneous Q24H  . fluticasone  2 spray Each Nare QHS  . vancomycin  125 mg Oral QID   Continuous Infusions:    LOS: 2 days    Time spent: 36 minutes    Beverly Jimenez,LAWAL, MD [facility]  Pager [phone]  If 7PM-7AM, please contact night-coverage [email or web address] Password [facility] 04/05/2018, 9:39 AM

## 2018-04-06 DIAGNOSIS — E871 Hypo-osmolality and hyponatremia: Secondary | ICD-10-CM

## 2018-04-06 DIAGNOSIS — A0472 Enterocolitis due to Clostridium difficile, not specified as recurrent: Secondary | ICD-10-CM

## 2018-04-06 DIAGNOSIS — N179 Acute kidney failure, unspecified: Secondary | ICD-10-CM

## 2018-04-06 LAB — BASIC METABOLIC PANEL
Anion gap: 7 (ref 5–15)
BUN: 8 mg/dL (ref 6–20)
CALCIUM: 7.9 mg/dL — AB (ref 8.9–10.3)
CO2: 20 mmol/L — ABNORMAL LOW (ref 22–32)
CREATININE: 0.7 mg/dL (ref 0.44–1.00)
Chloride: 105 mmol/L (ref 101–111)
GFR calc Af Amer: >60 mL/min (ref >60)
Glucose, Bld: 105 mg/dL — ABNORMAL HIGH (ref 65–99)
Potassium: 3.7 mmol/L (ref 3.5–5.1)
SODIUM: 132 mmol/L — AB (ref 135–145)

## 2018-04-06 LAB — CBC WITH DIFFERENTIAL/PLATELET
Basophils Absolute: 0 10*3/uL (ref 0.0–0.1)
Basophils Relative: 0 %
EOS ABS: 0.2 10*3/uL (ref 0.0–0.7)
EOS PCT: 1 %
HCT: 29.1 % — ABNORMAL LOW (ref 36.0–46.0)
Hemoglobin: 9.9 g/dL — ABNORMAL LOW (ref 12.0–15.0)
Lymphocytes Relative: 12 %
Lymphs Abs: 1.4 10*3/uL (ref 0.7–4.0)
MCH: 26.8 pg (ref 26.0–34.0)
MCHC: 34 g/dL (ref 30.0–36.0)
MCV: 78.9 fL (ref 78.0–100.0)
MONO ABS: 0.6 10*3/uL (ref 0.1–1.0)
Monocytes Relative: 5 %
Neutro Abs: 9.4 10*3/uL — ABNORMAL HIGH (ref 1.7–7.7)
Neutrophils Relative %: 82 %
PLATELETS: 244 10*3/uL (ref 150–400)
RBC: 3.69 MIL/uL — ABNORMAL LOW (ref 3.87–5.11)
RDW: 14.8 % (ref 11.5–15.5)
WBC: 11.6 10*3/uL — ABNORMAL HIGH (ref 4.0–10.5)

## 2018-04-06 NOTE — Progress Notes (Signed)
PROGRESS NOTE  Beverly Jimenez CXK:[id number] DOB: Jan 26, 1925 DOA: 04/03/2018 PCP: Marton Redwood, MD  HPI/Recap of past 24 hours: Beverly Jimenez a 84 y.o.femalewith medical history significantfor asthma, HTN.Patient presented to the ED with complaints of multiple episodes of nonbloody loose stools and lower abdominal pain of one-week duration and fevers up to101, PTA. Patient was seen4/28/19, for similar symptoms but at that time blood work was unrevealing, andpatient was discharged to home.Patient subsequently followed up with her primary care provider ,blood work revealedelevated WBC, EDevaluation was recommendedfor CT imaging. Of note, patient had dentalwork-tooth extraction, 3 weeks ago PTA forwhich she was prescribed 2 antibiotics (unknown names). Workup showed positive C.Diff. Pt admitted for further management.  Today, pt reported feeling better, still with diarrhea, although fewer episodes so far, about 5 times yesterday. Denies worsening abdominal pain, N/V, fever/chills, chest pain, SOB.    Assessment/Plan: Principal Problem:   AKI (acute kidney injury) ([place]) Active Problems:   HTN (hypertension)   Acute hyponatremia   Asthma   Pseudomembranous colitis   Hypokalemia   Hypotension   Leucocytosis  Sepsis 2/2 C. Difficile colitis Afebrile, resolving leukocytosis, with fewer episodes of loose stools BC X 2: NGTD CT abdomen and pelvis-diffuse thickening of the colon with pericolonic fat stranding likely representing pseudomembranous colitis Stool C.diff positive Continue oral Vancomycin for 10 days  Acute kidney injury Resolved s/p IVF  Hyponatremia Improving Likely 2/2 dehydration  Normocytic anemia Baseline around 9.7, s/p 1 PRBC on 04/04/18 Daily CBC  History of asthma Stable Dulera, Albuterol nebs prn     Code Status: Full  Family Communication: None at bedside  Disposition Plan: Possibly home once  stable   Consultants:  None  Procedures:  None  Antimicrobials:  P.o. vancomycin  DVT prophylaxis: Lovenox   Objective: Vitals:   04/05/18 1555 04/05/18 1923 04/05/18 2125 04/06/18 0516  BP: (!) 131/52  (!) 119/53 131/61  Pulse: 87  81 71  Resp: (!) 24  18 20   Temp: 98 F (36.7 C)  99 F (37.2 C) 98.5 F (36.9 C)  TempSrc: Oral  Oral Oral  SpO2: 100% 94% 100% 100%  Weight:      Height:        Intake/Output Summary (Last 24 hours) at 04/06/2018 1426 Last data filed at 04/06/2018 0900 Gross per 24 hour  Intake 240 ml  Output 850 ml  Net -610 ml   Filed Weights   04/04/18 0037  Weight: 79.4 kg (175 lb)    Exam:   General: NAD  Cardiovascular: S1, S2 present  Respiratory: CTA B  Abdomen: Soft, nontender, nondistended, bowel sounds present  Musculoskeletal: No pedal edema bilaterally  Skin: Normal  Psychiatry: Normal mood   Data Reviewed: CBC: Recent Labs  Lab 04/03/18 1654 04/04/18 0616 04/04/18 1636 04/05/18 0620 04/06/18 0742  WBC 21.7* 19.4* 24.9* 19.9* 11.6*  NEUTROABS  --   --   --  17.8* 9.4*  HGB 9.7* 7.7* 10.3* 9.8* 9.9*  HCT 29.4* 22.7* 30.5* 29.2* 29.1*  MCV 79.2 79.1 79.8 78.9 78.9  PLT 285 214 220 212 970   Basic Metabolic Panel: Recent Labs  Lab 04/01/18 1707 04/03/18 1654 04/04/18 0006 04/04/18 0616 04/05/18 0620 04/06/18 0742  NA 130* 126*  --  126* 132* 132*  K 3.9 3.6  --  3.2* 3.9 3.7  CL 97* 94*  --  99* 107 105  CO2 23 20*  --  18* 18* 20*  GLUCOSE 106* 142*  --  107*  107* 105*  BUN 10 23*  --  23* 15 8  CREATININE 0.95 1.89*  --  1.32* 0.85 0.70  CALCIUM 9.6 8.5*  --  7.1* 7.4* 7.9*  MG  --   --  1.5*  --   --   --    GFR: Estimated Creatinine Clearance: 47.7 mL/min (by C-G formula based on SCr of 0.7 mg/dL). Liver Function Tests: Recent Labs  Lab 04/01/18 1707 04/03/18 1654 04/05/18 0620  AST 23 28 22   ALT 16 17 15   ALKPHOS 54 65 53  BILITOT 0.6 0.7 0.7  PROT 7.3 7.0 5.4*  ALBUMIN 3.7 3.2*  2.3*   Recent Labs  Lab 04/01/18 1707 04/03/18 1654  LIPASE 21 19   No results for input(s): AMMONIA in the last 168 hours. Coagulation Profile: No results for input(s): INR, PROTIME in the last 168 hours. Cardiac Enzymes: No results for input(s): CKTOTAL, CKMB, CKMBINDEX, TROPONINI in the last 168 hours. BNP (last 3 results) No results for input(s): PROBNP in the last 8760 hours. HbA1C: No results for input(s): HGBA1C in the last 72 hours. CBG: No results for input(s): GLUCAP in the last 168 hours. Lipid Profile: No results for input(s): CHOL, HDL, LDLCALC, TRIG, CHOLHDL, LDLDIRECT in the last 72 hours. Thyroid Function Tests: No results for input(s): TSH, T4TOTAL, FREET4, T3FREE, THYROIDAB in the last 72 hours. Anemia Panel: No results for input(s): VITAMINB12, FOLATE, FERRITIN, TIBC, IRON, RETICCTPCT in the last 72 hours. Urine analysis:    Component Value Date/Time   COLORURINE YELLOW 07/27/2017 1259   APPEARANCEUR CLEAR 07/27/2017 1259   LABSPEC 1.008 07/27/2017 1259   PHURINE 7.0 07/27/2017 1259   GLUCOSEU NEGATIVE 07/27/2017 1259   HGBUR NEGATIVE 07/27/2017 1259   [place] 07/27/2017 1259   KETONESUR NEGATIVE 07/27/2017 1259   PROTEINUR NEGATIVE 07/27/2017 1259   NITRITE NEGATIVE 07/27/2017 1259   LEUKOCYTESUR NEGATIVE 07/27/2017 1259   Sepsis Labs: @LABRCNTIP (procalcitonin:4,lacticidven:4)  ) Recent Results (from the past 240 hour(s))  Culture, blood (routine x 2)     Status: None (Preliminary result)   Collection Time: 04/03/18 10:49 PM  Result Value Ref Range Status   Specimen Description   Final    BLOOD RIGHT ANTECUBITAL Performed at [facility], [place] [address] [place], [place] [address]    Special Requests   Final    BOTTLES DRAWN AEROBIC AND ANAEROBIC Blood Culture adequate volume Performed at [place] [address], [place], [place] [address]    Culture   Final    NO GROWTH 2  DAYS Performed at [place] [address], [place], [place] [address]    Report Status PENDING  Incomplete  Culture, blood (routine x 2)     Status: None (Preliminary result)   Collection Time: 04/03/18 10:54 PM  Result Value Ref Range Status   Specimen Description   Final    BLOOD RIGHT HAND Performed at [place] [address], [place], [place] [address]    Special Requests   Final    BOTTLES DRAWN AEROBIC AND ANAEROBIC Blood Culture adequate volume Performed at [place] [address] [place], [place] [address]    Culture   Final    NO GROWTH 2 DAYS Performed at [place] [address] [place], [place] [address]    Report Status PENDING  Incomplete  Gastrointestinal Panel by PCR , Stool     Status: None   Collection Time:  04/03/18 11:05 PM  Result Value Ref Range Status   Campylobacter species NOT DETECTED NOT DETECTED Final   Plesimonas shigelloides NOT DETECTED NOT DETECTED Final   Salmonella species NOT DETECTED NOT DETECTED Final   Yersinia enterocolitica NOT DETECTED NOT DETECTED Final   Vibrio species NOT DETECTED NOT DETECTED Final   Vibrio cholerae NOT DETECTED NOT DETECTED Final   Enteroaggregative E coli (EAEC) NOT DETECTED NOT DETECTED Final   Enteropathogenic E coli (EPEC) NOT DETECTED NOT DETECTED Final   Enterotoxigenic E coli (ETEC) NOT DETECTED NOT DETECTED Final   Shiga like toxin producing E coli (STEC) NOT DETECTED NOT DETECTED Final   Shigella/Enteroinvasive E coli (EIEC) NOT DETECTED NOT DETECTED Final   Cryptosporidium NOT DETECTED NOT DETECTED Final   Cyclospora cayetanensis NOT DETECTED NOT DETECTED Final   Entamoeba histolytica NOT DETECTED NOT DETECTED Final   Giardia lamblia NOT DETECTED NOT DETECTED Final   Adenovirus F40/41 NOT DETECTED NOT DETECTED Final   Astrovirus NOT DETECTED NOT DETECTED Final   Norovirus GI/GII NOT DETECTED NOT DETECTED Final   Rotavirus A NOT  DETECTED NOT DETECTED Final   Sapovirus (I, II, IV, and V) NOT DETECTED NOT DETECTED Final    Comment: Performed at [facility], [place] [place], [place] [address]  C difficile quick scan w PCR reflex     Status: Abnormal   Collection Time: 04/03/18 11:05 PM  Result Value Ref Range Status   C Diff antigen POSITIVE (A) NEGATIVE Final   C Diff toxin NEGATIVE NEGATIVE Final   C Diff interpretation Results are indeterminate. See PCR results.  Final    Comment: Performed at [facility], [place] [address], [place], [place] [address]  C. Diff by PCR, Reflexed     Status: Abnormal   Collection Time: 04/03/18 11:05 PM  Result Value Ref Range Status   Toxigenic C. Difficile by PCR POSITIVE (A) NEGATIVE Final    Comment: Positive for toxigenic C. difficile with little to no toxin production. Only treat if clinical presentation suggests symptomatic illness. Performed at [facility], [place] [address] [place], [place] [address]       Studies: No results found.  Scheduled Meds: . aspirin EC  81 mg Oral Daily  . enoxaparin (LOVENOX) injection  40 mg Subcutaneous Q24H  . fluticasone  2 spray Each Nare QHS  . latanoprost  1 drop Both Eyes QHS  . mometasone-formoterol  2 puff Inhalation BID  . vancomycin  125 mg Oral QID    Continuous Infusions:   LOS: 3 days     Alma Friendly, MD [facility]   If 7PM-7AM, please contact night-coverage [email or web address] Password [email or web address] 04/06/2018, 2:26 PM

## 2018-04-06 NOTE — Care Management Note (Signed)
Case Management Note  Patient Details  Name: Beverly Jimenez MRN: [id number] Date of Birth: [birth year]  Subjective/Objective:                  Colitis by ct scan  Action/Plan: Date: Apr 06, 2018 Velva Harman, BSN, [place], [place] Chart and notes review for patient progress and needs. Will follow for case management and discharge needs. No cm or discharge needs present at time of this review. Next review date: [id number]  Expected Discharge Date:                  Expected Discharge Plan:  Home/Self Care  In-House Referral:     Discharge planning Services  CM Consult  Post Acute Care Choice:    Choice offered to:     DME Arranged:    DME Agency:     HH Arranged:    HH Agency:     Status of Service:  In process, will continue to follow  If discussed at Long Length of Stay Meetings, dates discussed:    Additional Comments:  Leeroy Cha, RN 04/06/2018, 9:31 AM

## 2018-04-06 NOTE — Evaluation (Signed)
Physical Therapy Evaluation Patient Details Name: Beverly Jimenez MRN: [id number] DOB: [birth year] Today's Date: 04/06/2018   History of Present Illness  Beverly Jimenez is a 84 y.o. female with medical history significant for asthma, HTN.  Patient presented to the ED with complaints of multiple episodes of nonbloody loose stools and lower abdominal pain of one-week duration.  She reports fevers up to 101 at home today. Patient was seen 04/01/18, 2 days ago for similar symptoms but at that time blood work was unrevealing, and patient was discharged to home.  Patient subsequently followed up with her primary care provider , blood work revealed elevated WBC, ED evaluation was recommended for CT imaging. C. diff positive.   Clinical Impression  Patient able to transfer with min guard and ambulate 40 feet using RW with min guard. Patient weak with decrease endurance and was living alone with intermittent support from family to retrieve her mail and take her to doctor's appointments. Patient's SPC is broken and she reports having a difficult time performing IADLs at home due to low endurance for standing activities. Pt admitted with above diagnosis. Pt currently with functional limitations due to the deficits listed below (see PT Problem List). Pt will benefit from skilled PT to increase their independence and safety with mobility to allow discharge to the venue listed below.       Follow Up Recommendations Home health PT;Supervision for mobility/OOB(vs SNF)    Equipment Recommendations  Other (comment)(none at this time. Patient may benefit from a replacement SPC and also a Rollator to improve her IADL safety and performance.)    Recommendations for Other Services OT consult     Precautions / Restrictions Precautions Precautions: Fall Restrictions Weight Bearing Restrictions: No      Mobility  Bed Mobility               General bed mobility comments: patient sitting in recliner at  beginning and end of session  Transfers Overall transfer level: Needs assistance   Transfers: Sit to/from Stand;Stand Pivot Transfers Sit to Stand: Min guard Stand pivot transfers: Min guard          Ambulation/Gait Ambulation/Gait assistance: Min guard Ambulation Distance (Feet): 40 Feet Assistive device: Rolling walker (2 wheeled) Gait Pattern/deviations: Step-through pattern;Decreased stride length;Decreased step length - right;Decreased step length - left Gait velocity: decreased   General Gait Details: slow, steady Tour manager Rankin (Stroke Patients Only)       Balance Overall balance assessment: Mild deficits observed, not formally tested                                           Pertinent Vitals/Pain Pain Assessment: No/denies pain    Home Living Family/patient expects to be discharged to:: Private residence Living Arrangements: Alone Available Help at Discharge: Available PRN/intermittently Type of Home: House Home Access: Level entry     Home Layout: One level Home Equipment: Grab bars - tub/shower Additional Comments: patient prefers baths    Prior Function Level of Independence: Independent with assistive device(s)         Comments: uses a RW of SPC; her SPC is broken     Hand Dominance        Extremity/Trunk Assessment   Upper Extremity Assessment Upper Extremity Assessment: Generalized  weakness    Lower Extremity Assessment Lower Extremity Assessment: Generalized weakness    Cervical / Trunk Assessment Cervical / Trunk Assessment: Kyphotic  Communication   Communication: No difficulties  Cognition Arousal/Alertness: Awake/alert Behavior During Therapy: WFL for tasks assessed/performed Overall Cognitive Status: Within Functional Limits for tasks assessed                                        General Comments      Exercises General  Exercises - Upper Extremity Shoulder Flexion: Seated;AROM;Strengthening;Both;10 reps General Exercises - Lower Extremity Gluteal Sets: Seated;Strengthening;Both;10 reps Long Arc Quad: Seated;Strengthening;Both;10 reps Hip Flexion/Marching: Seated;Strengthening;Both;10 reps Toe Raises: Seated;AROM;Strengthening;Both;10 reps Heel Raises: Seated;AROM;Strengthening;Both;10 reps   Assessment/Plan    PT Assessment Patient needs continued PT services  PT Problem List Decreased strength;Decreased activity tolerance;Decreased balance;Decreased mobility       PT Treatment Interventions Gait training;Therapeutic exercise;Therapeutic activities;Patient/family education;Functional mobility training    PT Goals (Current goals can be found in the Care Plan section)  Acute Rehab PT Goals Patient Stated Goal: get stronger to go home. PT Goal Formulation: With patient Time For Goal Achievement: 04/13/18 Potential to Achieve Goals: Good    Frequency Min 3X/week   Barriers to discharge        Co-evaluation               AM-PAC PT "6 Clicks" Daily Activity  Outcome Measure Difficulty turning over in bed (including adjusting bedclothes, sheets and blankets)?: A Little Difficulty moving from lying on back to sitting on the side of the bed? : A Little Difficulty sitting down on and standing up from a chair with arms (e.g., wheelchair, bedside commode, etc,.)?: A Little Help needed moving to and from a bed to chair (including a wheelchair)?: A Little Help needed walking in hospital room?: A Little Help needed climbing 3-5 steps with a railing? : A Little 6 Click Score: 18    End of Session Equipment Utilized During Treatment: Gait belt Activity Tolerance: Patient tolerated treatment well;Patient limited by fatigue Patient left: in chair;with chair alarm set;with call bell/phone within reach Nurse Communication: Mobility status PT Visit Diagnosis: Unsteadiness on feet (R26.81);Difficulty  in walking, not elsewhere classified (R26.2);Muscle weakness (generalized) (M62.81)    Time: 4825-0037 PT Time Calculation (min) (ACUTE ONLY): 31 min   Charges:   PT Evaluation $PT Eval Moderate Complexity: 1 Mod PT Treatments $Gait Training: 8-22 mins   PT G Codes:        Elford Evilsizer D. Hartnett-Rands, MS, PT Per [place] [phone] 04/06/2018, 2:27 PM

## 2018-04-07 LAB — BASIC METABOLIC PANEL
ANION GAP: 8 (ref 5–15)
BUN: 9 mg/dL (ref 6–20)
CALCIUM: 8.7 mg/dL — AB (ref 8.9–10.3)
CO2: 22 mmol/L (ref 22–32)
CREATININE: 0.69 mg/dL (ref 0.44–1.00)
Chloride: 103 mmol/L (ref 101–111)
GFR calc Af Amer: >60 mL/min (ref >60)
GFR calc non Af Amer: >60 mL/min (ref >60)
GLUCOSE: 102 mg/dL — AB (ref 65–99)
Potassium: 3.7 mmol/L (ref 3.5–5.1)
Sodium: 133 mmol/L — ABNORMAL LOW (ref 135–145)

## 2018-04-07 LAB — CBC WITH DIFFERENTIAL/PLATELET
BASOS PCT: 0 %
Basophils Absolute: 0 10*3/uL (ref 0.0–0.1)
EOS PCT: 2 %
Eosinophils Absolute: 0.2 10*3/uL (ref 0.0–0.7)
HEMATOCRIT: 30.1 % — AB (ref 36.0–46.0)
Hemoglobin: 10 g/dL — ABNORMAL LOW (ref 12.0–15.0)
LYMPHS ABS: 2 10*3/uL (ref 0.7–4.0)
Lymphocytes Relative: 19 %
MCH: 26.3 pg (ref 26.0–34.0)
MCHC: 33.2 g/dL (ref 30.0–36.0)
MCV: 79.2 fL (ref 78.0–100.0)
MONOS PCT: 7 %
Monocytes Absolute: 0.7 10*3/uL (ref 0.1–1.0)
NEUTROS ABS: 7.5 10*3/uL (ref 1.7–7.7)
Neutrophils Relative %: 72 %
Platelets: 300 10*3/uL (ref 150–400)
RBC: 3.8 MIL/uL — ABNORMAL LOW (ref 3.87–5.11)
RDW: 15.1 % (ref 11.5–15.5)
WBC: 10.4 10*3/uL (ref 4.0–10.5)

## 2018-04-07 MED ORDER — FAMOTIDINE 20 MG PO TABS
20.0000 mg | ORAL_TABLET | Freq: Two times a day (BID) | ORAL | Status: DC
  Administered 2018-04-07 – 2018-04-09 (×6): 20 mg via ORAL
  Filled 2018-04-07 (×6): qty 1

## 2018-04-07 NOTE — Progress Notes (Signed)
PROGRESS NOTE  DESPINA BOAN GQQ:[id number] DOB: Apr 01, 1925 DOA: 04/03/2018 PCP: Marton Redwood, MD  HPI/Recap of past 24 hours: Ornella P Pearceis a 84 y.o.femalewith medical history significantfor asthma, HTN.Patient presented to the ED with complaints of multiple episodes of nonbloody loose stools and lower abdominal pain of one-week duration and fevers up to101, PTA. Patient was seen4/28/19, for similar symptoms but at that time blood work was unrevealing, andpatient was discharged to home.Patient subsequently followed up with her primary care provider ,blood work revealedelevated WBC, EDevaluation was recommendedfor CT imaging. Of note, patient had dentalwork-tooth extraction, 3 weeks ago PTA forwhich she was prescribed 2 antibiotics (unknown names). Workup showed positive C.Diff. Pt admitted for further management.  Today, pt reported feeling better, last episode of diarrhea early this am, not watery. Denies worsening abdominal pain, N/V, fever/chills, chest pain, SOB.   Assessment/Plan: Principal Problem:   AKI (acute kidney injury) ([place]) Active Problems:   HTN (hypertension)   Acute hyponatremia   Asthma   Pseudomembranous colitis   Hypokalemia   Hypotension   Leucocytosis  Sepsis 2/2 C. Difficile colitis Afebrile, resolved leukocytosis BC X 2: NGTD CT abdomen and pelvis-diffuse thickening of the colon with pericolonic fat stranding likely representing pseudomembranous colitis Stool C.diff positive Continue oral Vancomycin for 10 days  Acute kidney injury Resolved s/p IVF  Hyponatremia Improving Likely 2/2 dehydration  Normocytic anemia Baseline around 9.7, s/p PRBC on 04/04/18 Daily CBC  History of asthma Stable Dulera, Albuterol nebs prn     Code Status: Full  Family Communication: None at bedside  Disposition Plan: Possibly home once stable   Consultants:  None  Procedures:  None  Antimicrobials:  P.o. vancomycin  DVT  prophylaxis: Lovenox   Objective: Vitals:   04/06/18 2145 04/07/18 0451 04/07/18 1341 04/07/18 1446  BP: 132/63 126/61 (!) 148/68   Pulse: 77 64 69   Resp: (!) 22 20 20    Temp: 98.7 F (37.1 C) 98.7 F (37.1 C) 99 F (37.2 C)   TempSrc: Oral  Oral   SpO2: 96% 99% 100% 99%  Weight:      Height:        Intake/Output Summary (Last 24 hours) at 04/07/2018 1536 Last data filed at 04/07/2018 1342 Gross per 24 hour  Intake -  Output 850 ml  Net -850 ml   Filed Weights   04/04/18 0037  Weight: 79.4 kg (175 lb)    Exam:   General: NAD  Cardiovascular: S1, S2 present  Respiratory: CTA B  Abdomen: Soft, nontender, nondistended, bowel sounds present  Musculoskeletal: No pedal edema bilaterally  Skin: Normal  Psychiatry: Normal mood   Data Reviewed: CBC: Recent Labs  Lab 04/04/18 0616 04/04/18 1636 04/05/18 0620 04/06/18 0742 04/07/18 0547  WBC 19.4* 24.9* 19.9* 11.6* 10.4  NEUTROABS  --   --  17.8* 9.4* 7.5  HGB 7.7* 10.3* 9.8* 9.9* 10.0*  HCT 22.7* 30.5* 29.2* 29.1* 30.1*  MCV 79.1 79.8 78.9 78.9 79.2  PLT 214 220 212 244 671   Basic Metabolic Panel: Recent Labs  Lab 04/03/18 1654 04/04/18 0006 04/04/18 0616 04/05/18 0620 04/06/18 0742 04/07/18 0547  NA 126*  --  126* 132* 132* 133*  K 3.6  --  3.2* 3.9 3.7 3.7  CL 94*  --  99* 107 105 103  CO2 20*  --  18* 18* 20* 22  GLUCOSE 142*  --  107* 107* 105* 102*  BUN 23*  --  23* 15 8 9   CREATININE 1.89*  --  1.32* 0.85 0.70 0.69  CALCIUM 8.5*  --  7.1* 7.4* 7.9* 8.7*  MG  --  1.5*  --   --   --   --    GFR: Estimated Creatinine Clearance: 47.7 mL/min (by C-G formula based on SCr of 0.69 mg/dL). Liver Function Tests: Recent Labs  Lab 04/01/18 1707 04/03/18 1654 04/05/18 0620  AST 23 28 22   ALT 16 17 15   ALKPHOS 54 65 53  BILITOT 0.6 0.7 0.7  PROT 7.3 7.0 5.4*  ALBUMIN 3.7 3.2* 2.3*   Recent Labs  Lab 04/01/18 1707 04/03/18 1654  LIPASE 21 19   No results for input(s): AMMONIA in the  last 168 hours. Coagulation Profile: No results for input(s): INR, PROTIME in the last 168 hours. Cardiac Enzymes: No results for input(s): CKTOTAL, CKMB, CKMBINDEX, TROPONINI in the last 168 hours. BNP (last 3 results) No results for input(s): PROBNP in the last 8760 hours. HbA1C: No results for input(s): HGBA1C in the last 72 hours. CBG: No results for input(s): GLUCAP in the last 168 hours. Lipid Profile: No results for input(s): CHOL, HDL, LDLCALC, TRIG, CHOLHDL, LDLDIRECT in the last 72 hours. Thyroid Function Tests: No results for input(s): TSH, T4TOTAL, FREET4, T3FREE, THYROIDAB in the last 72 hours. Anemia Panel: No results for input(s): VITAMINB12, FOLATE, FERRITIN, TIBC, IRON, RETICCTPCT in the last 72 hours. Urine analysis:    Component Value Date/Time   COLORURINE YELLOW 07/27/2017 1259   APPEARANCEUR CLEAR 07/27/2017 1259   LABSPEC 1.008 07/27/2017 1259   PHURINE 7.0 07/27/2017 1259   GLUCOSEU NEGATIVE 07/27/2017 1259   HGBUR NEGATIVE 07/27/2017 1259   [place] 07/27/2017 1259   KETONESUR NEGATIVE 07/27/2017 1259   PROTEINUR NEGATIVE 07/27/2017 1259   NITRITE NEGATIVE 07/27/2017 1259   LEUKOCYTESUR NEGATIVE 07/27/2017 1259   Sepsis Labs: @LABRCNTIP (procalcitonin:4,lacticidven:4)  ) Recent Results (from the past 240 hour(s))  Culture, blood (routine x 2)     Status: None (Preliminary result)   Collection Time: 04/03/18 10:49 PM  Result Value Ref Range Status   Specimen Description   Final    BLOOD RIGHT ANTECUBITAL Performed at [facility], [place] [address], [place], [place] [address]    Special Requests   Final    BOTTLES DRAWN AEROBIC AND ANAEROBIC Blood Culture adequate volume Performed at [place] [address], [place], [place] [address]    Culture   Final    NO GROWTH 3 DAYS Performed at [facility], [place] [address], [place], [place] [address]    Report Status PENDING  Incomplete    Culture, blood (routine x 2)     Status: None (Preliminary result)   Collection Time: 04/03/18 10:54 PM  Result Value Ref Range Status   Specimen Description   Final    BLOOD RIGHT HAND Performed at [place] [address], [place], [place] [address]    Special Requests   Final    BOTTLES DRAWN AEROBIC AND ANAEROBIC Blood Culture adequate volume Performed at [place] [address] [place], [place] [address]    Culture   Final    NO GROWTH 3 DAYS Performed at [facility], [place] [address] [place], [place] [address]    Report Status PENDING  Incomplete  Gastrointestinal Panel by PCR , Stool     Status: None   Collection Time: 04/03/18 11:05 PM  Result Value Ref Range Status   Campylobacter species NOT DETECTED NOT DETECTED  Final   Plesimonas shigelloides NOT DETECTED NOT DETECTED Final   Salmonella species NOT DETECTED NOT DETECTED Final   Yersinia enterocolitica NOT DETECTED NOT DETECTED Final   Vibrio species NOT DETECTED NOT DETECTED Final   Vibrio cholerae NOT DETECTED NOT DETECTED Final   Enteroaggregative E coli (EAEC) NOT DETECTED NOT DETECTED Final   Enteropathogenic E coli (EPEC) NOT DETECTED NOT DETECTED Final   Enterotoxigenic E coli (ETEC) NOT DETECTED NOT DETECTED Final   Shiga like toxin producing E coli (STEC) NOT DETECTED NOT DETECTED Final   Shigella/Enteroinvasive E coli (EIEC) NOT DETECTED NOT DETECTED Final   Cryptosporidium NOT DETECTED NOT DETECTED Final   Cyclospora cayetanensis NOT DETECTED NOT DETECTED Final   Entamoeba histolytica NOT DETECTED NOT DETECTED Final   Giardia lamblia NOT DETECTED NOT DETECTED Final   Adenovirus F40/41 NOT DETECTED NOT DETECTED Final   Astrovirus NOT DETECTED NOT DETECTED Final   Norovirus GI/GII NOT DETECTED NOT DETECTED Final   Rotavirus A NOT DETECTED NOT DETECTED Final   Sapovirus (I, II, IV, and V) NOT DETECTED NOT DETECTED Final    Comment: Performed at [facility], [place] [place], [place] [address]  C difficile quick scan w PCR reflex     Status: Abnormal   Collection Time: 04/03/18 11:05 PM  Result Value Ref Range Status   C Diff antigen POSITIVE (A) NEGATIVE Final   C Diff toxin NEGATIVE NEGATIVE Final   C Diff interpretation Results are indeterminate. See PCR results.  Final    Comment: Performed at [facility], [place] [address] [place], [place] [address]  C. Diff by PCR, Reflexed     Status: Abnormal   Collection Time: 04/03/18 11:05 PM  Result Value Ref Range Status   Toxigenic C. Difficile by PCR POSITIVE (A) NEGATIVE Final    Comment: Positive for toxigenic C. difficile with little to no toxin production. Only treat if clinical presentation suggests symptomatic illness. Performed at [facility], [place] [address], [place], [place] [address]       Studies: No results found.  Scheduled Meds: . aspirin EC  81 mg Oral Daily  . enoxaparin (LOVENOX) injection  40 mg Subcutaneous Q24H  . famotidine  20 mg Oral BID  . fluticasone  2 spray Each Nare QHS  . latanoprost  1 drop Both Eyes QHS  . mometasone-formoterol  2 puff Inhalation BID  . vancomycin  125 mg Oral QID    Continuous Infusions:   LOS: 4 days     Alma Friendly, MD [facility]   If 7PM-7AM, please contact night-coverage [email or web address] Password [facility] 04/07/2018, 3:36 PM

## 2018-04-08 LAB — BASIC METABOLIC PANEL
Anion gap: 8 (ref 5–15)
BUN: 12 mg/dL (ref 6–20)
CHLORIDE: 107 mmol/L (ref 101–111)
CO2: 23 mmol/L (ref 22–32)
CREATININE: 0.83 mg/dL (ref 0.44–1.00)
Calcium: 9.4 mg/dL (ref 8.9–10.3)
Glucose, Bld: 103 mg/dL — ABNORMAL HIGH (ref 65–99)
POTASSIUM: 4 mmol/L (ref 3.5–5.1)
SODIUM: 138 mmol/L (ref 135–145)

## 2018-04-08 LAB — CBC WITH DIFFERENTIAL/PLATELET
BASOS ABS: 0 10*3/uL (ref 0.0–0.1)
BASOS PCT: 0 %
EOS ABS: 0.3 10*3/uL (ref 0.0–0.7)
Eosinophils Relative: 3 %
HCT: 31 % — ABNORMAL LOW (ref 36.0–46.0)
HEMOGLOBIN: 10.3 g/dL — AB (ref 12.0–15.0)
LYMPHS PCT: 22 %
Lymphs Abs: 2.2 10*3/uL (ref 0.7–4.0)
MCH: 26.4 pg (ref 26.0–34.0)
MCHC: 33.2 g/dL (ref 30.0–36.0)
MCV: 79.5 fL (ref 78.0–100.0)
MONO ABS: 0.7 10*3/uL (ref 0.1–1.0)
Monocytes Relative: 7 %
NEUTROS PCT: 68 %
Neutro Abs: 7 10*3/uL (ref 1.7–7.7)
PLATELETS: 326 10*3/uL (ref 150–400)
RBC: 3.9 MIL/uL (ref 3.87–5.11)
RDW: 15 % (ref 11.5–15.5)
WBC: 10.2 10*3/uL (ref 4.0–10.5)

## 2018-04-08 NOTE — Progress Notes (Signed)
PROGRESS NOTE  Beverly Jimenez WVP:[id number] DOB: Jun 16, 1925 DOA: 04/03/2018 PCP: Marton Redwood, MD  HPI/Recap of past 24 hours: Beverly P Pearceis a 84 y.o.femalewith medical history significantfor asthma, HTN.Patient presented to the ED with complaints of multiple episodes of nonbloody loose stools and lower abdominal pain of one-week duration and fevers up to101, PTA. Patient was seen4/28/19, for similar symptoms but at that time blood work was unrevealing, andpatient was discharged to home.Patient subsequently followed up with her primary care provider ,blood work revealedelevated WBC, EDevaluation was recommendedfor CT imaging. Of note, patient had dentalwork-tooth extraction, 3 weeks ago PTA forwhich she was prescribed 2 antibiotics (unknown names). Workup showed positive C.Diff. Pt admitted for further management.  Today, pt reported feeling better, no more diarrhea noted. Denies worsening abdominal pain, N/V, fever/chills, chest pain, SOB. Lives alone, not sure if she would be able to take care of herself.   Assessment/Plan: Principal Problem:   AKI (acute kidney injury) ([place]) Active Problems:   HTN (hypertension)   Acute hyponatremia   Asthma   Pseudomembranous colitis   Hypokalemia   Hypotension   Leucocytosis  Sepsis 2/2 C. Difficile colitis Afebrile, resolved leukocytosis BC X 2: NGTD CT abdomen and pelvis-diffuse thickening of the colon with pericolonic fat stranding likely representing pseudomembranous colitis Stool C.diff positive Continue oral Vancomycin, last dose on 04/13/18  Acute kidney injury Resolved s/p IVF  Hyponatremia Improving Likely 2/2 dehydration  Normocytic anemia Baseline around 9.7, s/p PRBC on 04/04/18  History of asthma Stable Dulera, Albuterol nebs prn     Code Status: Full  Family Communication: None at bedside  Disposition Plan: PT to reassess on 5/6 for possible SNF Vs HH. Pt lives alone     Consultants:  None  Procedures:  None  Antimicrobials:  P.o. vancomycin  DVT prophylaxis: Lovenox   Objective: Vitals:   04/07/18 2134 04/08/18 0507 04/08/18 0911 04/08/18 1438  BP: 140/62 (!) 136/58  (!) 148/69  Pulse: 71 64  73  Resp: 18 19    Temp: 98.5 F (36.9 C) 98.3 F (36.8 C)  99.1 F (37.3 C)  TempSrc: Oral Oral  Oral  SpO2: 100% 99% 96% 100%  Weight:      Height:        Intake/Output Summary (Last 24 hours) at 04/08/2018 1535 Last data filed at 04/08/2018 1205 Gross per 24 hour  Intake 720 ml  Output 300 ml  Net 420 ml   Filed Weights   04/04/18 0037  Weight: 79.4 kg (175 lb)    Exam:   General: NAD  Cardiovascular: S1, S2 present  Respiratory: CTA B  Abdomen: Soft, nontender, nondistended, bowel sounds present  Musculoskeletal: No pedal edema bilaterally  Skin: Normal  Psychiatry: Normal mood   Data Reviewed: CBC: Recent Labs  Lab 04/04/18 1636 04/05/18 0620 04/06/18 0742 04/07/18 0547 04/08/18 0531  WBC 24.9* 19.9* 11.6* 10.4 10.2  NEUTROABS  --  17.8* 9.4* 7.5 7.0  HGB 10.3* 9.8* 9.9* 10.0* 10.3*  HCT 30.5* 29.2* 29.1* 30.1* 31.0*  MCV 79.8 78.9 78.9 79.2 79.5  PLT 220 212 244 300 546   Basic Metabolic Panel: Recent Labs  Lab 04/04/18 0006 04/04/18 0616 04/05/18 0620 04/06/18 0742 04/07/18 0547 04/08/18 0531  NA  --  126* 132* 132* 133* 138  K  --  3.2* 3.9 3.7 3.7 4.0  CL  --  99* 107 105 103 107  CO2  --  18* 18* 20* 22 23  GLUCOSE  --  107*  107* 105* 102* 103*  BUN  --  23* 15 8 9 12   CREATININE  --  1.32* 0.85 0.70 0.69 0.83  CALCIUM  --  7.1* 7.4* 7.9* 8.7* 9.4  MG 1.5*  --   --   --   --   --    GFR: Estimated Creatinine Clearance: 45.9 mL/min (by C-G formula based on SCr of 0.83 mg/dL). Liver Function Tests: Recent Labs  Lab 04/01/18 1707 04/03/18 1654 04/05/18 0620  AST 23 28 22   ALT 16 17 15   ALKPHOS 54 65 53  BILITOT 0.6 0.7 0.7  PROT 7.3 7.0 5.4*  ALBUMIN 3.7 3.2* 2.3*   Recent  Labs  Lab 04/01/18 1707 04/03/18 1654  LIPASE 21 19   No results for input(s): AMMONIA in the last 168 hours. Coagulation Profile: No results for input(s): INR, PROTIME in the last 168 hours. Cardiac Enzymes: No results for input(s): CKTOTAL, CKMB, CKMBINDEX, TROPONINI in the last 168 hours. BNP (last 3 results) No results for input(s): PROBNP in the last 8760 hours. HbA1C: No results for input(s): HGBA1C in the last 72 hours. CBG: No results for input(s): GLUCAP in the last 168 hours. Lipid Profile: No results for input(s): CHOL, HDL, LDLCALC, TRIG, CHOLHDL, LDLDIRECT in the last 72 hours. Thyroid Function Tests: No results for input(s): TSH, T4TOTAL, FREET4, T3FREE, THYROIDAB in the last 72 hours. Anemia Panel: No results for input(s): VITAMINB12, FOLATE, FERRITIN, TIBC, IRON, RETICCTPCT in the last 72 hours. Urine analysis:    Component Value Date/Time   COLORURINE YELLOW 07/27/2017 1259   APPEARANCEUR CLEAR 07/27/2017 1259   LABSPEC 1.008 07/27/2017 1259   PHURINE 7.0 07/27/2017 1259   GLUCOSEU NEGATIVE 07/27/2017 1259   HGBUR NEGATIVE 07/27/2017 1259   [place] 07/27/2017 1259   KETONESUR NEGATIVE 07/27/2017 1259   PROTEINUR NEGATIVE 07/27/2017 1259   NITRITE NEGATIVE 07/27/2017 1259   LEUKOCYTESUR NEGATIVE 07/27/2017 1259   Sepsis Labs: @LABRCNTIP (procalcitonin:4,lacticidven:4)  ) Recent Results (from the past 240 hour(s))  Culture, blood (routine x 2)     Status: None (Preliminary result)   Collection Time: 04/03/18 10:49 PM  Result Value Ref Range Status   Specimen Description   Final    BLOOD RIGHT ANTECUBITAL Performed at [facility], [place] [address], [place], [place] [address]    Special Requests   Final    BOTTLES DRAWN AEROBIC AND ANAEROBIC Blood Culture adequate volume Performed at [place] [address] [place], [place] [address]    Culture   Final    NO GROWTH 4 DAYS Performed at [facility], [place] [address], [place], [place] [address]    Report Status PENDING  Incomplete  Culture, blood (routine x 2)     Status: None (Preliminary result)   Collection Time: 04/03/18 10:54 PM  Result Value Ref Range Status   Specimen Description   Final    BLOOD RIGHT HAND Performed at [place] [address] [place], [place] [address]    Special Requests   Final    BOTTLES DRAWN AEROBIC AND ANAEROBIC Blood Culture adequate volume Performed at [place] [address] [place], [place] [address]    Culture   Final    NO GROWTH 4 DAYS Performed at [facility], [place] [address], [place], [place] [address]    Report Status PENDING  Incomplete  Gastrointestinal Panel by PCR , Stool     Status: None  Collection Time: 04/03/18 11:05 PM  Result Value Ref Range Status   Campylobacter species NOT DETECTED NOT DETECTED Final   Plesimonas shigelloides NOT DETECTED NOT DETECTED Final   Salmonella species NOT DETECTED NOT DETECTED Final   Yersinia enterocolitica NOT DETECTED NOT DETECTED Final   Vibrio species NOT DETECTED NOT DETECTED Final   Vibrio cholerae NOT DETECTED NOT DETECTED Final   Enteroaggregative E coli (EAEC) NOT DETECTED NOT DETECTED Final   Enteropathogenic E coli (EPEC) NOT DETECTED NOT DETECTED Final   Enterotoxigenic E coli (ETEC) NOT DETECTED NOT DETECTED Final   Shiga like toxin producing E coli (STEC) NOT DETECTED NOT DETECTED Final   Shigella/Enteroinvasive E coli (EIEC) NOT DETECTED NOT DETECTED Final   Cryptosporidium NOT DETECTED NOT DETECTED Final   Cyclospora cayetanensis NOT DETECTED NOT DETECTED Final   Entamoeba histolytica NOT DETECTED NOT DETECTED Final   Giardia lamblia NOT DETECTED NOT DETECTED Final   Adenovirus F40/41 NOT DETECTED NOT DETECTED Final   Astrovirus NOT DETECTED NOT DETECTED Final   Norovirus GI/GII NOT DETECTED NOT DETECTED Final   Rotavirus A NOT DETECTED NOT DETECTED Final    Sapovirus (I, II, IV, and V) NOT DETECTED NOT DETECTED Final    Comment: Performed at [facility], [place] [place], [place] [address]  C difficile quick scan w PCR reflex     Status: Abnormal   Collection Time: 04/03/18 11:05 PM  Result Value Ref Range Status   C Diff antigen POSITIVE (A) NEGATIVE Final   C Diff toxin NEGATIVE NEGATIVE Final   C Diff interpretation Results are indeterminate. See PCR results.  Final    Comment: Performed at [facility], [place] [address], [place], [place] [address]  C. Diff by PCR, Reflexed     Status: Abnormal   Collection Time: 04/03/18 11:05 PM  Result Value Ref Range Status   Toxigenic C. Difficile by PCR POSITIVE (A) NEGATIVE Final    Comment: Positive for toxigenic C. difficile with little to no toxin production. Only treat if clinical presentation suggests symptomatic illness. Performed at [facility], [place] [address] [place], [place] [address]       Studies: No results found.  Scheduled Meds: . aspirin EC  81 mg Oral Daily  . enoxaparin (LOVENOX) injection  40 mg Subcutaneous Q24H  . famotidine  20 mg Oral BID  . fluticasone  2 spray Each Nare QHS  . latanoprost  1 drop Both Eyes QHS  . mometasone-formoterol  2 puff Inhalation BID  . vancomycin  125 mg Oral QID    Continuous Infusions:   LOS: 5 days     Alma Friendly, MD [facility]   If 7PM-7AM, please contact night-coverage [email or web address] Password [email or web address] 04/08/2018, 3:35 PM

## 2018-04-09 DIAGNOSIS — R488 Other symbolic dysfunctions: Secondary | ICD-10-CM | POA: Diagnosis not present

## 2018-04-09 DIAGNOSIS — R5381 Other malaise: Secondary | ICD-10-CM | POA: Diagnosis not present

## 2018-04-09 DIAGNOSIS — A048 Other specified bacterial intestinal infections: Secondary | ICD-10-CM | POA: Diagnosis not present

## 2018-04-09 DIAGNOSIS — Z7189 Other specified counseling: Secondary | ICD-10-CM | POA: Diagnosis not present

## 2018-04-09 DIAGNOSIS — E871 Hypo-osmolality and hyponatremia: Secondary | ICD-10-CM | POA: Diagnosis not present

## 2018-04-09 DIAGNOSIS — I10 Essential (primary) hypertension: Secondary | ICD-10-CM | POA: Diagnosis not present

## 2018-04-09 DIAGNOSIS — A0472 Enterocolitis due to Clostridium difficile, not specified as recurrent: Secondary | ICD-10-CM | POA: Diagnosis not present

## 2018-04-09 DIAGNOSIS — N179 Acute kidney failure, unspecified: Secondary | ICD-10-CM | POA: Diagnosis not present

## 2018-04-09 DIAGNOSIS — A419 Sepsis, unspecified organism: Secondary | ICD-10-CM | POA: Diagnosis not present

## 2018-04-09 DIAGNOSIS — M6281 Muscle weakness (generalized): Secondary | ICD-10-CM | POA: Diagnosis not present

## 2018-04-09 DIAGNOSIS — I959 Hypotension, unspecified: Secondary | ICD-10-CM | POA: Diagnosis not present

## 2018-04-09 DIAGNOSIS — Z7401 Bed confinement status: Secondary | ICD-10-CM | POA: Diagnosis not present

## 2018-04-09 DIAGNOSIS — E7849 Other hyperlipidemia: Secondary | ICD-10-CM | POA: Diagnosis not present

## 2018-04-09 DIAGNOSIS — M2550 Pain in unspecified joint: Secondary | ICD-10-CM | POA: Diagnosis not present

## 2018-04-09 DIAGNOSIS — J45998 Other asthma: Secondary | ICD-10-CM | POA: Diagnosis not present

## 2018-04-09 DIAGNOSIS — J45901 Unspecified asthma with (acute) exacerbation: Secondary | ICD-10-CM | POA: Diagnosis not present

## 2018-04-09 DIAGNOSIS — E785 Hyperlipidemia, unspecified: Secondary | ICD-10-CM | POA: Diagnosis not present

## 2018-04-09 LAB — CULTURE, BLOOD (ROUTINE X 2)
Culture: NO GROWTH
Culture: NO GROWTH
SPECIAL REQUESTS: ADEQUATE
Special Requests: ADEQUATE

## 2018-04-09 MED ORDER — VANCOMYCIN 50 MG/ML ORAL SOLUTION: 125.0000 mg | Freq: Four times a day (QID) | ORAL | 0 refills | Status: AC

## 2018-04-09 NOTE — Clinical Social Work Note (Signed)
Clinical Social Work Assessment  Patient Details  Name: Beverly Jimenez MRN: [id number] Date of Birth: 02-09-1925  Date of referral:  04/09/18               Reason for consult:  Facility Placement                Permission sought to share information with:  Case Manager, Customer service manager, Family Supports Permission granted to share information::  Yes, Verbal Permission Granted  Name::     Nature conservation officer::  SNF  Relationship::  daughter  Contact Information:     Housing/Transportation Living arrangements for the past 2 months:  Single Family Home Source of Information:  Patient, Adult Children Patient Interpreter Needed:  None Criminal Activity/Legal Involvement Pertinent to Current Situation/Hospitalization:  No - Comment as needed Significant Relationships:  Adult Children Lives with:  Self Do you feel safe going back to the place where you live?    Need for family participation in patient care:  Yes (Comment)  Care giving concerns:  No care giving concerns at the time of assessment.    Social Worker assessment / plan:  LCSW consulted for SNF placement.   Patient admitted for Diarrhea, Abdominal Pain  LCSW met met bedside with patient. Patient's daughter Hoyle Sauer present.   Patient's daughter reports that patient is independent in ADLs. Daughter, Hoyle Sauer, reports that patient has a cane and walker for ambulation. Patient lives alone.   Patient and family agreeable to SNF at dc.   PLAN: SNF at dc.   Employment status:  Retired Forensic scientist:  Medicare PT Recommendations:  [place] / Referral to community resources:     Patient/Family's Response to care: Family is thankful for LCSW visit.   Patient/Family's Understanding of and Emotional Response to Diagnosis, Current Treatment, and Prognosis:   Patient and family states that patient will benefit from rehab prior to going home. Daughter, Hoyle Sauer expressed concerns about  patient needing assistance after rehab. LCSW explained that facility can set up any home health needs when patient dc's from rehab.   Emotional Assessment Appearance:  Appears stated age Attitude/Demeanor/Rapport:    Affect (typically observed):  Pleasant Orientation:  Oriented to Self, Oriented to Place, Oriented to  Time, Oriented to Situation Alcohol / Substance use:  Not Applicable Psych involvement (Current and /or in the community):  No (Comment)  Discharge Needs  Concerns to be addressed:  No discharge needs identified Readmission within the last 30 days:  No Current discharge risk:  None Barriers to Discharge:  No Barriers Identified   Servando Snare, LCSW 04/09/2018, 10:54 AM

## 2018-04-09 NOTE — Clinical Social Work Placement (Addendum)
    3:56 PM Patient and family chose bed at [facility].  LCSW confirmed bed with facility.  Patient will transport by [facility]. [facility] set up for 6:30pm.   LCSW faxed dc docs to facility.   RN report number: [phone]. Please notify family when patient has been picked up by [facility].  BKJ    CLINICAL SOCIAL WORK PLACEMENT  NOTE  Date:  04/09/2018  Patient Details  Name: Beverly Jimenez MRN: [id number] Date of Birth: 07-31-1925  Clinical Social Work is seeking post-discharge placement for this patient at the [place] level of care (*CSW will initial, date and re-position this form in  chart as items are completed):  Yes   Patient/family provided with [facility] Work [facility]'s list of facilities offering this level of care within the geographic area requested by the patient (or if unable, by the patient's family).  Yes   Patient/family informed of their freedom to choose among providers that offer the needed level of care, that participate in Medicare, Medicaid or managed care program needed by the patient, have an available bed and are willing to accept the patient.  Yes   Patient/family informed of [facility]'s ownership interest in [facility] and [facility], as well as of the fact that they are under no obligation to receive care at these facilities.  PASRR submitted to EDS on       PASRR number received on 04/09/18     Existing PASRR number confirmed on       FL2 transmitted to all facilities in geographic area requested by pt/family on 04/09/18     FL2 transmitted to all facilities within larger geographic area on       Patient informed that his/her managed care company has contracts with or will negotiate with certain facilities, including the following:        Yes   Patient/family informed of bed offers received.  Patient chooses bed at [facility]     Physician recommends and patient chooses bed at      Patient to be  transferred to [facility] on 04/09/18.  Patient to be transferred to facility by EMS     Patient family notified on 04/09/18 of transfer.  Name of family member notified:  Hoyle Sauer     PHYSICIAN Please prepare priority discharge summary, including medications     Additional Comment:    _______________________________________________ Servando Snare, LCSW 04/09/2018, 3:55 PM

## 2018-04-09 NOTE — NC FL2 (Signed)
[place] MEDICAID FL2 LEVEL OF CARE SCREENING TOOL     IDENTIFICATION  Patient Name: Beverly Jimenez Birthdate: Sep 30, 1925 Sex: female Admission Date (Current Location): 04/03/2018  [facility] and [place] Number:  Herbalist and Address:  [facility],  [place], [place]      Provider Number: [id number]  Attending Physician Name and Address:  Alma Friendly, MD  Relative Name and Phone Number:       Current Level of Care: Hospital Recommended Level of Care: [place] Prior Approval Number:    Date Approved/Denied: 04/09/18 PASRR Number: [id number]  Discharge Plan: SNF    Current Diagnoses: Patient Active Problem List   Diagnosis Date Noted  . AKI (acute kidney injury) ([place]) 04/04/2018  . Pseudomembranous colitis 04/04/2018  . Hypokalemia 04/04/2018  . Hypotension 04/04/2018  . Leucocytosis 04/04/2018  . Acute pain of left knee 10/25/2017  . Hemorrhage of rectum and anus 09/18/2017  . Chronic left-sided low back pain with left-sided sciatica 03/27/2017  . Influenza 01/17/2017  . Influenza A 01/16/2017  . HTN (hypertension) 01/16/2017  . Microcytic anemia 01/16/2017  . Acute hyponatremia 01/16/2017  . Asthma 01/16/2017  . UTI (urinary tract infection) 01/16/2017  . Anemia   . Respiratory distress   . Neck pain 01/11/2017  . Acute pain of left shoulder 01/11/2017  . Left leg pain 01/06/2015  . Varicose veins of both lower extremities with complications 79/39/0300  . HNP (herniated nucleus pulposus), lumbar 07/05/2013    Orientation RESPIRATION BLADDER Height & Weight     Self, Time, Situation, Place  Normal Continent Weight: 175 lb (79.4 kg) Height:  5\' 4"  (162.6 cm)  BEHAVIORAL SYMPTOMS/MOOD NEUROLOGICAL BOWEL NUTRITION STATUS      Continent Diet(See DC Summary)  AMBULATORY STATUS COMMUNICATION OF NEEDS Skin   Extensive Assist Verbally Normal                       Personal Care  Assistance Level of Assistance  Bathing, Feeding, Dressing Bathing Assistance: Limited assistance Feeding assistance: Independent Dressing Assistance: Limited assistance     Functional Limitations Info  Sight, Hearing, Speech Sight Info: Impaired Hearing Info: Impaired(Right, hearing aid) Speech Info: Adequate    SPECIAL CARE FACTORS FREQUENCY  PT (By licensed PT), OT (By licensed OT)     PT Frequency: 5x/week OT Frequency: 5x/week            Contractures Contractures Info: Not present    Additional Factors Info  Code Status, Allergies, Isolation Precautions Code Status Info: Full Allergies Info: Amoxicillin, Macrodantin Nitrofurantoin Macrocrystal, Sulfa Antibiotics     Isolation Precautions Info: enteric precautions     Current Medications (04/09/2018):  This is the current hospital active medication list Current Facility-Administered Medications  Medication Dose Route Frequency Provider Last Rate Last Dose  . albuterol (PROVENTIL) (2.5 MG/3ML) 0.083% nebulizer solution 3 mL  3 mL Inhalation Q4H PRN Emokpae, Ejiroghene E, MD      . aspirin EC tablet 81 mg  81 mg Oral Daily Emokpae, Ejiroghene E, MD   81 mg at 04/09/18 0844  . enoxaparin (LOVENOX) injection 40 mg  40 mg Subcutaneous Q24H Pham, Anh P, RPH   40 mg at 04/09/18 0845  . famotidine (PEPCID) tablet 20 mg  20 mg Oral BID Alma Friendly, MD   20 mg at 04/09/18 0845  . fluticasone (FLONASE) 50 MCG/ACT nasal spray 2 spray  2 spray Each Nare QHS Emokpae,  Ejiroghene E, MD   2 spray at 04/08/18 2141  . latanoprost (XALATAN) 0.005 % ophthalmic solution 1 drop  1 drop Both Eyes QHS Elwyn Reach, MD   1 drop at 04/08/18 2140  . mometasone-formoterol (DULERA) 100-5 MCG/ACT inhaler 2 puff  2 puff Inhalation BID Elwyn Reach, MD   2 puff at 04/08/18 2058  . ondansetron (ZOFRAN) tablet 4 mg  4 mg Oral Q6H PRN Emokpae, Ejiroghene E, MD       Or  . ondansetron (ZOFRAN) injection 4 mg  4 mg Intravenous Q6H PRN  Emokpae, Ejiroghene E, MD      . traMADol (ULTRAM) tablet 50 mg  50 mg Oral Q6H PRN Emokpae, Ejiroghene E, MD   50 mg at 04/09/18 0035  . vancomycin (VANCOCIN) 50 mg/mL oral solution 125 mg  125 mg Oral QID Emokpae, Ejiroghene E, MD   125 mg at 04/09/18 0845     Discharge Medications: Please see discharge summary for a list of discharge medications.  Relevant Imaging Results:  Relevant Lab Results:   Additional Information [id number]  Servando Snare, LCSW

## 2018-04-09 NOTE — Discharge Summary (Signed)
Discharge Summary  Beverly Jimenez VHQ:[id number] DOB: Feb 14, 1925  PCP: Marton Redwood, MD  Admit date: 04/03/2018 Discharge date: 04/09/2018  Time spent: 40 mins  Recommendations for Outpatient Follow-up:  1. PCP  Discharge Diagnoses:  Active Hospital Problems   Diagnosis Date Noted  . AKI (acute kidney injury) ([place]) 04/04/2018  . Pseudomembranous colitis 04/04/2018  . Hypokalemia 04/04/2018  . Hypotension 04/04/2018  . Leucocytosis 04/04/2018  . HTN (hypertension) 01/16/2017  . Asthma 01/16/2017  . Acute hyponatremia 01/16/2017    Resolved Hospital Problems  No resolved problems to display.    Discharge Condition: Stable  Diet recommendation: Heart healthy  Vitals:   04/09/18 0539 04/09/18 1155  BP: 132/63   Pulse: 62   Resp: 18   Temp: 98 F (36.7 C)   SpO2: 98% 98%    History of present illness:  Beverly Jimenez a 84 y.o.femalewith medical history significantfor asthma, HTN.Patient presented to the ED with complaints of multiple episodes of nonbloody loose stools and lower abdominal pain of one-week duration and fevers up to101, PTA. Patient was seen4/28/19, for similar symptoms but at that time blood work was unrevealing, andpatient was discharged to home.Patient subsequently followed up with her primary care provider ,blood work revealedelevated WBC, EDevaluation was recommendedfor CT imaging. Of note, patient had dentalwork-tooth extraction, 3 weeks ago PTA forwhich she was prescribed 2 antibiotics (unknown names). Workup showed positive C.Diff. Pt admitted for further management.  Today, pt reported diarrhea resolved. Daughter at bedside. Denies worsening abdominal pain, N/V, fever/chills, chest pain, SOB. Pt stable for discharge at SNF.   Hospital Course:  Principal Problem:   AKI (acute kidney injury) ([place]) Active Problems:   HTN (hypertension)   Acute hyponatremia   Asthma   Pseudomembranous colitis   Hypokalemia   Hypotension  Leucocytosis  Sepsis 2/2 C. Difficile colitis Afebrile, resolved leukocytosis BC X 2: NGTD CT abdomen and pelvis-diffuse thickening of the colon with pericolonic fat stranding likely representing pseudomembranous colitis Stool C.diff positive Continue oral Vancomycin, last dose on 04/13/18  Acute kidney injury Resolved s/p IVF  Hyponatremia Resolved Likely 2/2 dehydration  Normocytic anemia Baseline around 9.7, s/p PRBC on 04/04/18  History of asthma Stable Dulera, Albuterol nebs prn     Procedures:  None  Consultations:  None  Discharge Exam: BP 132/63 (BP Location: Right Arm)   Pulse 62   Temp 98 F (36.7 C) (Oral)   Resp 18   Ht 5\' 4"  (1.626 m)   Wt 79.4 kg (175 lb)   SpO2 98%   BMI 30.04 kg/m   General: NAD  Cardiovascular: S1, S2 present Respiratory: CTAB   Discharge Instructions You were cared for by a hospitalist during your hospital stay. If you have any questions about your discharge medications or the care you received while you were in the hospital after you are discharged, you can call the unit and asked to speak with the hospitalist on call if the hospitalist that took care of you is not available. Once you are discharged, your primary care physician will handle any further medical issues. Please note that NO REFILLS for any discharge medications will be authorized once you are discharged, as it is imperative that you return to your primary care physician (or establish a relationship with a primary care physician if you do not have one) for your aftercare needs so that they can reassess your need for medications and monitor your lab values.  Discharge Instructions    Diet - low sodium heart  healthy   Complete by:  As directed    Increase activity slowly   Complete by:  As directed      Allergies as of 04/09/2018      Reactions   Amoxicillin Hives, Itching, Rash   Has patient had a PCN reaction causing immediate rash, facial/tongue/throat  swelling, SOB or lightheadedness with hypotension: Unknown Has patient had a PCN reaction causing severe rash involving mucus membranes or skin necrosis: Yes  Has patient had a PCN reaction that required hospitalization: No Has patient had a PCN reaction occurring within the last 10 years: No  If all of the above answers are "NO", then may proceed with Cephalosporin use.   Macrodantin [nitrofurantoin Macrocrystal] Shortness Of Breath   Sulfa Antibiotics Rash   Shortness of breath      Medication List    STOP taking these medications   guaiFENesin 600 MG 12 hr tablet Commonly known as:  MUCINEX     TAKE these medications   acetaminophen 500 MG tablet Commonly known as:  TYLENOL Take 1-2 tablets (500-1,000 mg total) by mouth every 8 (eight) hours as needed for mild pain, moderate pain, fever or headache.   albuterol 108 (90 Base) MCG/ACT inhaler Commonly known as:  PROVENTIL HFA;VENTOLIN HFA Inhale 2 puffs into the lungs every 4 (four) hours as needed for wheezing or shortness of breath.   aspirin EC 81 MG tablet Take 81 mg by mouth daily.   bimatoprost 0.01 % Soln Commonly known as:  LUMIGAN Place 1 drop into both eyes at bedtime.   CALTRATE PLUS PO Take 2 tablets by mouth 2 (two) times daily.   esomeprazole 40 MG capsule Commonly known as:  NEXIUM Take 40 mg by mouth daily at 12 noon.   fish oil-omega-3 fatty acids 1000 MG capsule Take 1 g by mouth 2 (two) times daily.   fluticasone 50 MCG/ACT nasal spray Commonly known as:  FLONASE Place 2 sprays into the nose at bedtime.   Fluticasone-Salmeterol 100-50 MCG/DOSE Aepb Commonly known as:  ADVAIR Inhale 1 puff into the lungs 2 (two) times daily.   niacin 500 MG tablet Take 500 mg by mouth daily with breakfast.   olmesartan 20 MG tablet Commonly known as:  BENICAR Take 20 mg by mouth daily.   Potassium 99 MG Tabs Take 99 mg by mouth daily.   sodium chloride 0.65 % Soln nasal spray Commonly known as:   OCEAN Place 1 spray into both nostrils as needed for congestion.   traMADol 50 MG tablet Commonly known as:  ULTRAM Take 50 mg by mouth every 6 (six) hours as needed for moderate pain or severe pain.   vancomycin 50 mg/mL oral solution Commonly known as:  VANCOCIN Take 2.5 mLs (125 mg total) by mouth 4 (four) times daily for 17 doses.   VITAMIN B-12 PO Place 1 tablet under the tongue daily.   vitamin C 500 MG tablet Commonly known as:  ASCORBIC ACID Take 500 mg by mouth daily.   vitamin E 400 UNIT capsule Take 400 Units by mouth daily.      Allergies  Allergen Reactions  . Amoxicillin Hives, Itching and Rash    Has patient had a PCN reaction causing immediate rash, facial/tongue/throat swelling, SOB or lightheadedness with hypotension: Unknown Has patient had a PCN reaction causing severe rash involving mucus membranes or skin necrosis: Yes  Has patient had a PCN reaction that required hospitalization: No Has patient had a PCN reaction occurring within the last  10 years: No  If all of the above answers are "NO", then may proceed with Cephalosporin use.   Clancy Gourd [Nitrofurantoin Macrocrystal] Shortness Of Breath  . Sulfa Antibiotics Rash    Shortness of breath    Contact information for follow-up providers    Marton Redwood, MD. Schedule an appointment as soon as possible for a visit in 1 week(s).   Specialty:  Internal Medicine Contact information: [place] [address] [phone]            Contact information for after-discharge care    Destination    [facility] .   Service:  Skilled Nursing Contact information: [address] [place] [phone]                   The results of significant diagnostics from this hospitalization (including imaging, microbiology, ancillary and laboratory) are listed below for reference.    Significant Diagnostic Studies: Ct Abdomen Pelvis Wo  Contrast  Result Date: 04/03/2018 CLINICAL DATA:  Diarrhea for 1 week. Seen in ED a few days ago. Increased weakness and fever. EXAM: CT ABDOMEN AND PELVIS WITHOUT CONTRAST TECHNIQUE: Multidetector CT imaging of the abdomen and pelvis was performed following the standard protocol without IV contrast. COMPARISON:  07/27/2017 FINDINGS: Lower chest: Linear atelectasis in the lung bases. Small air cysts on the right. Coronary artery calcifications. Hepatobiliary: No focal liver abnormality is seen. Status post cholecystectomy. No biliary dilatation. Pancreas: Unremarkable. No pancreatic ductal dilatation or surrounding inflammatory changes. Spleen: Dense calcification in the splenic hilum may represent dystrophic calcification, granuloma, or a calcified splenic artery aneurysm. No change since prior study. Spleen size is normal. No focal lesions identified on noncontrast imaging. Adrenals/Urinary Tract: Adrenal glands are unremarkable. Kidneys are normal, without renal calculi, focal lesion, or hydronephrosis. Bladder is unremarkable. Stomach/Bowel: Stomach, small bowel, and colon are not abnormally distended. There is diffuse wall thickening throughout the colon with pericolonic fat stranding throughout. This is consistent with pancolitis and could represent pseudomembranous colitis or inflammatory bowel disease. Infectious colitis also a possibility but probably less likely. The appendix is not identified. Vascular/Lymphatic: Aortic atherosclerosis. No enlarged abdominal or pelvic lymph nodes. Reproductive: Uterus and bilateral adnexa are unremarkable. Other: No abdominal wall hernia or abnormality. No abdominopelvic ascites. Musculoskeletal: Laminectomies with posterior fixation and intervertebral fusion from L3-L5. No destructive bone lesions. IMPRESSION: 1. Diffuse thickening of the colon wall with pericolonic fat stranding likely representing pseudomembranous colitis or inflammatory bowel disease. Infectious  colitis probably less likely. 2. Linear atelectasis in the lung bases. 3. Aortic atherosclerosis. Electronically Signed   By: Lucienne Capers M.D.   On: 04/03/2018 21:55    Microbiology: Recent Results (from the past 240 hour(s))  Culture, blood (routine x 2)     Status: None   Collection Time: 04/03/18 10:49 PM  Result Value Ref Range Status   Specimen Description   Final    BLOOD RIGHT ANTECUBITAL Performed at [facility] [address] [place], [place] [address]    Special Requests   Final    BOTTLES DRAWN AEROBIC AND ANAEROBIC Blood Culture adequate volume Performed at [facility] [address], [place], [place] [address]    Culture   Final    NO GROWTH 5 DAYS Performed at [facility], [place] [address] [place], [place] [address]    Report Status 04/09/2018 FINAL  Final  Culture, blood (routine x 2)     Status: None  Collection Time: 04/03/18 10:54 PM  Result Value Ref Range Status   Specimen Description   Final    BLOOD RIGHT HAND Performed at [facility], [place] [address], [place], [place] [address]    Special Requests   Final    BOTTLES DRAWN AEROBIC AND ANAEROBIC Blood Culture adequate volume Performed at [facility] [address], [place], [place] [address]    Culture   Final    NO GROWTH 5 DAYS Performed at [facility], [place] [address] [place], [place] [address]    Report Status 04/09/2018 FINAL  Final  Gastrointestinal Panel by PCR , Stool     Status: None   Collection Time: 04/03/18 11:05 PM  Result Value Ref Range Status   Campylobacter species NOT DETECTED NOT DETECTED Final   Plesimonas shigelloides NOT DETECTED NOT DETECTED Final   Salmonella species NOT DETECTED NOT DETECTED Final   Yersinia enterocolitica NOT DETECTED NOT DETECTED Final   Vibrio species NOT DETECTED NOT DETECTED Final   Vibrio cholerae NOT DETECTED NOT DETECTED Final   Enteroaggregative E  coli (EAEC) NOT DETECTED NOT DETECTED Final   Enteropathogenic E coli (EPEC) NOT DETECTED NOT DETECTED Final   Enterotoxigenic E coli (ETEC) NOT DETECTED NOT DETECTED Final   Shiga like toxin producing E coli (STEC) NOT DETECTED NOT DETECTED Final   Shigella/Enteroinvasive E coli (EIEC) NOT DETECTED NOT DETECTED Final   Cryptosporidium NOT DETECTED NOT DETECTED Final   Cyclospora cayetanensis NOT DETECTED NOT DETECTED Final   Entamoeba histolytica NOT DETECTED NOT DETECTED Final   Giardia lamblia NOT DETECTED NOT DETECTED Final   Adenovirus F40/41 NOT DETECTED NOT DETECTED Final   Astrovirus NOT DETECTED NOT DETECTED Final   Norovirus GI/GII NOT DETECTED NOT DETECTED Final   Rotavirus A NOT DETECTED NOT DETECTED Final   Sapovirus (I, II, IV, and V) NOT DETECTED NOT DETECTED Final    Comment: Performed at [facility], [address] [place], [place] [address]  C difficile quick scan w PCR reflex     Status: Abnormal   Collection Time: 04/03/18 11:05 PM  Result Value Ref Range Status   C Diff antigen POSITIVE (A) NEGATIVE Final   C Diff toxin NEGATIVE NEGATIVE Final   C Diff interpretation Results are indeterminate. See PCR results.  Final    Comment: Performed at [facility], [place] [address], [place], [place] [address]  C. Diff by PCR, Reflexed     Status: Abnormal   Collection Time: 04/03/18 11:05 PM  Result Value Ref Range Status   Toxigenic C. Difficile by PCR POSITIVE (A) NEGATIVE Final    Comment: Positive for toxigenic C. difficile with little to no toxin production. Only treat if clinical presentation suggests symptomatic illness. Performed at [facility], [place] [address] [place],  [address]      Labs: Basic Metabolic Panel: Recent Labs  Lab 04/04/18 0006 04/04/18 0102 04/05/18 0620 04/06/18 0742 04/07/18 0547 04/08/18 0531  NA  --  126* 132* 132* 133* 138  K  --  3.2* 3.9 3.7 3.7 4.0  CL  --  99* 107 105 103 107  CO2  --   18* 18* 20* 22 23  GLUCOSE  --  107* 107* 105* 102* 103*  BUN  --  23* 15 8 9 12   CREATININE  --  1.32* 0.85 0.70 0.69 0.83  CALCIUM  --  7.1* 7.4* 7.9* 8.7* 9.4  MG 1.5*  --   --   --   --   --  Liver Function Tests: Recent Labs  Lab 04/03/18 1654 04/05/18 0620  AST 28 22  ALT 17 15  ALKPHOS 65 53  BILITOT 0.7 0.7  PROT 7.0 5.4*  ALBUMIN 3.2* 2.3*   Recent Labs  Lab 04/03/18 1654  LIPASE 19   No results for input(s): AMMONIA in the last 168 hours. CBC: Recent Labs  Lab 04/04/18 1636 04/05/18 0620 04/06/18 0742 04/07/18 0547 04/08/18 0531  WBC 24.9* 19.9* 11.6* 10.4 10.2  NEUTROABS  --  17.8* 9.4* 7.5 7.0  HGB 10.3* 9.8* 9.9* 10.0* 10.3*  HCT 30.5* 29.2* 29.1* 30.1* 31.0*  MCV 79.8 78.9 78.9 79.2 79.5  PLT 220 212 244 300 326   Cardiac Enzymes: No results for input(s): CKTOTAL, CKMB, CKMBINDEX, TROPONINI in the last 168 hours. BNP: BNP (last 3 results) No results for input(s): BNP in the last 8760 hours.  ProBNP (last 3 results) No results for input(s): PROBNP in the last 8760 hours.  CBG: No results for input(s): GLUCAP in the last 168 hours.     Signed:  Alma Friendly, MD [facility] 04/09/2018, 3:33 PM

## 2018-04-09 NOTE — Progress Notes (Signed)
Report given to [place], LPN at [facility]. No questions or concerns at this time. Patient will be transferred via [facility].

## 2018-04-09 NOTE — Progress Notes (Signed)
Physical Therapy Treatment Patient Details Name: Beverly Jimenez MRN: [id number] DOB: August 27, 1925 Today's Date: 04/09/2018    History of Present Illness Beverly Jimenez is a 84 y.o. female with medical history significant for asthma, HTN.  Patient presented to the ED with complaints of multiple episodes of nonbloody loose stools and lower abdominal pain of one-week duration.  She reports fevers up to 101 at home today. Patient was seen 04/01/18, 2 days ago for similar symptoms but at that time blood work was unrevealing, and patient was discharged to home.  Patient subsequently followed up with her primary care provider , blood work revealed elevated WBC, ED evaluation was recommended for CT imaging. C. diff positive    PT Comments    Patient verbalizes difficulties with ADLs and IADLs taking additional time for rest breaks as she is unable to stand to make a meal or do her dishes. Today, patient ambulated 200 feet using RW with supervision/min guard for safety. Concern for patient's safety if returning directly to home. Patient continues to be unsafe to discharge home alone due to safety concerns with OOB activities and will benefit from continued post-acute therapy. Currently recommending skilled nursing facility stay unless caregivers can provide assistance for OOB activities. Pt admitted with above diagnosis. Pt currently with functional limitations due to the deficits listed below (see PT Problem List).  Pt will benefit from skilled PT to increase their independence and safety with mobility to allow discharge to the venue listed below.      Follow Up Recommendations  Supervision for mobility/OOB;SNF     Equipment Recommendations  Other (comment)(none at this time. Patient may benefit from a replacement SPC and also a Rollator to improve her IADL safety and performance.)    Recommendations for Other Services OT consult     Precautions / Restrictions Precautions Precautions:  Fall Restrictions Weight Bearing Restrictions: No    Mobility  Bed Mobility Overal bed mobility: Modified Independent                Transfers Overall transfer level: Needs assistance Equipment used: Rolling walker (2 wheeled) Transfers: Sit to/from [facility] Sit to Stand: Min guard Stand pivot transfers: Min guard          Ambulation/Gait Ambulation/Gait assistance: Min guard Ambulation Distance (Feet): 200 Feet Assistive device: Rolling walker (2 wheeled) Gait Pattern/deviations: Step-through pattern;Decreased stride length;Decreased step length - right;Decreased step length - left Gait velocity: decreased   General Gait Details: slow, steady pace   Chief Strategy Officer    Modified Rankin (Stroke Patients Only)       Balance Overall balance assessment: Needs assistance Sitting-balance support: Feet supported;No upper extremity supported Sitting balance-Leahy Scale: Good     Standing balance support: Bilateral upper extremity supported;During functional activity Standing balance-Leahy Scale: Fair                              Cognition Arousal/Alertness: Awake/alert Behavior During Therapy: WFL for tasks assessed/performed Overall Cognitive Status: Within Functional Limits for tasks assessed                                        Exercises General Exercises - Upper Extremity Shoulder Flexion: Seated;AROM;Strengthening;Both;10 reps General Exercises - Lower Extremity Long Arc Quad: Seated;Strengthening;Both;10 reps  Hip Flexion/Marching: Seated;Strengthening;Both;10 reps Toe Raises: Seated;AROM;Strengthening;Both;10 reps Heel Raises: Seated;AROM;Strengthening;Both;10 reps    General Comments        Pertinent Vitals/Pain Pain Assessment: No/denies pain    Home Living                      Prior Function            PT Goals (current goals can now be found in  the care plan section) Acute Rehab PT Goals Patient Stated Goal: rehab to get stronger to go home. PT Goal Formulation: With patient/family Time For Goal Achievement: 04/16/18 Potential to Achieve Goals: Good Progress towards PT goals: Progressing toward goals    Frequency    Min 2X/week      PT Plan Discharge plan needs to be updated    Co-evaluation              AM-PAC PT "6 Clicks" Daily Activity  Outcome Measure  Difficulty turning over in bed (including adjusting bedclothes, sheets and blankets)?: A Little Difficulty moving from lying on back to sitting on the side of the bed? : A Little Difficulty sitting down on and standing up from a chair with arms (e.g., wheelchair, bedside commode, etc,.)?: A Little Help needed moving to and from a bed to chair (including a wheelchair)?: A Little Help needed walking in hospital room?: A Little Help needed climbing 3-5 steps with a railing? : A Little 6 Click Score: 18    End of Session Equipment Utilized During Treatment: Gait belt Activity Tolerance: Patient tolerated treatment well Patient left: in chair;with call bell/phone within reach;with family/visitor present Nurse Communication: Mobility status PT Visit Diagnosis: Unsteadiness on feet (R26.81);Difficulty in walking, not elsewhere classified (R26.2);Muscle weakness (generalized) (M62.81)     Time: 1696-7893 PT Time Calculation (min) (ACUTE ONLY): 29 min  Charges:  $Gait Training: 8-22 mins $Therapeutic Exercise: 8-22 mins                    G Codes:       [name], MS, PT Per [place] [phone] 04/09/2018, 10:52 AM

## 2018-04-09 NOTE — Progress Notes (Signed)
Pt discharged to [facility] in stable condition. Transferred via [place]. All HS meds given prior to discharge. Family notified.

## 2018-04-11 DIAGNOSIS — I10 Essential (primary) hypertension: Secondary | ICD-10-CM | POA: Diagnosis not present

## 2018-04-11 DIAGNOSIS — A048 Other specified bacterial intestinal infections: Secondary | ICD-10-CM | POA: Diagnosis not present

## 2018-04-11 DIAGNOSIS — E7849 Other hyperlipidemia: Secondary | ICD-10-CM | POA: Diagnosis not present

## 2018-04-11 DIAGNOSIS — J45998 Other asthma: Secondary | ICD-10-CM | POA: Diagnosis not present

## 2018-04-13 DIAGNOSIS — J45998 Other asthma: Secondary | ICD-10-CM | POA: Diagnosis not present

## 2018-04-13 DIAGNOSIS — R5381 Other malaise: Secondary | ICD-10-CM | POA: Diagnosis not present

## 2018-04-13 DIAGNOSIS — I10 Essential (primary) hypertension: Secondary | ICD-10-CM | POA: Diagnosis not present

## 2018-04-13 DIAGNOSIS — A048 Other specified bacterial intestinal infections: Secondary | ICD-10-CM | POA: Diagnosis not present

## 2018-04-16 DIAGNOSIS — M545 Low back pain: Secondary | ICD-10-CM | POA: Diagnosis not present

## 2018-04-16 DIAGNOSIS — D6489 Other specified anemias: Secondary | ICD-10-CM | POA: Diagnosis not present

## 2018-04-16 DIAGNOSIS — R600 Localized edema: Secondary | ICD-10-CM | POA: Diagnosis not present

## 2018-04-16 DIAGNOSIS — R5383 Other fatigue: Secondary | ICD-10-CM | POA: Diagnosis not present

## 2018-04-16 DIAGNOSIS — Z8619 Personal history of other infectious and parasitic diseases: Secondary | ICD-10-CM | POA: Diagnosis not present

## 2018-04-16 DIAGNOSIS — Z6830 Body mass index (BMI) 30.0-30.9, adult: Secondary | ICD-10-CM | POA: Diagnosis not present

## 2018-04-16 DIAGNOSIS — M25562 Pain in left knee: Secondary | ICD-10-CM | POA: Diagnosis not present

## 2018-04-16 DIAGNOSIS — J45909 Unspecified asthma, uncomplicated: Secondary | ICD-10-CM | POA: Diagnosis not present

## 2018-04-16 DIAGNOSIS — I10 Essential (primary) hypertension: Secondary | ICD-10-CM | POA: Diagnosis not present

## 2018-04-16 DIAGNOSIS — A0472 Enterocolitis due to Clostridium difficile, not specified as recurrent: Secondary | ICD-10-CM | POA: Diagnosis not present

## 2018-04-16 DIAGNOSIS — M6281 Muscle weakness (generalized): Secondary | ICD-10-CM | POA: Diagnosis not present

## 2018-04-16 DIAGNOSIS — J45998 Other asthma: Secondary | ICD-10-CM | POA: Diagnosis not present

## 2018-04-18 DIAGNOSIS — J45909 Unspecified asthma, uncomplicated: Secondary | ICD-10-CM | POA: Diagnosis not present

## 2018-04-18 DIAGNOSIS — Z8619 Personal history of other infectious and parasitic diseases: Secondary | ICD-10-CM | POA: Diagnosis not present

## 2018-04-18 DIAGNOSIS — I10 Essential (primary) hypertension: Secondary | ICD-10-CM | POA: Diagnosis not present

## 2018-04-18 DIAGNOSIS — M6281 Muscle weakness (generalized): Secondary | ICD-10-CM | POA: Diagnosis not present

## 2018-04-24 DIAGNOSIS — M6281 Muscle weakness (generalized): Secondary | ICD-10-CM | POA: Diagnosis not present

## 2018-04-24 DIAGNOSIS — J45909 Unspecified asthma, uncomplicated: Secondary | ICD-10-CM | POA: Diagnosis not present

## 2018-04-24 DIAGNOSIS — Z8619 Personal history of other infectious and parasitic diseases: Secondary | ICD-10-CM | POA: Diagnosis not present

## 2018-04-24 DIAGNOSIS — I10 Essential (primary) hypertension: Secondary | ICD-10-CM | POA: Diagnosis not present

## 2018-04-25 DIAGNOSIS — J45909 Unspecified asthma, uncomplicated: Secondary | ICD-10-CM | POA: Diagnosis not present

## 2018-04-25 DIAGNOSIS — D6489 Other specified anemias: Secondary | ICD-10-CM | POA: Diagnosis not present

## 2018-04-25 DIAGNOSIS — M810 Age-related osteoporosis without current pathological fracture: Secondary | ICD-10-CM | POA: Diagnosis not present

## 2018-04-25 DIAGNOSIS — Z6828 Body mass index (BMI) 28.0-28.9, adult: Secondary | ICD-10-CM | POA: Diagnosis not present

## 2018-04-25 DIAGNOSIS — I10 Essential (primary) hypertension: Secondary | ICD-10-CM | POA: Diagnosis not present

## 2018-04-25 DIAGNOSIS — Z8719 Personal history of other diseases of the digestive system: Secondary | ICD-10-CM | POA: Diagnosis not present

## 2018-04-25 DIAGNOSIS — R7301 Impaired fasting glucose: Secondary | ICD-10-CM | POA: Diagnosis not present

## 2018-04-25 DIAGNOSIS — R1013 Epigastric pain: Secondary | ICD-10-CM | POA: Diagnosis not present

## 2018-04-27 DIAGNOSIS — Z8619 Personal history of other infectious and parasitic diseases: Secondary | ICD-10-CM | POA: Diagnosis not present

## 2018-04-27 DIAGNOSIS — J45909 Unspecified asthma, uncomplicated: Secondary | ICD-10-CM | POA: Diagnosis not present

## 2018-04-27 DIAGNOSIS — I10 Essential (primary) hypertension: Secondary | ICD-10-CM | POA: Diagnosis not present

## 2018-04-27 DIAGNOSIS — M6281 Muscle weakness (generalized): Secondary | ICD-10-CM | POA: Diagnosis not present

## 2018-05-02 ENCOUNTER — Telehealth (INDEPENDENT_AMBULATORY_CARE_PROVIDER_SITE_OTHER): Payer: Self-pay | Admitting: Physician Assistant

## 2018-05-02 ENCOUNTER — Encounter (INDEPENDENT_AMBULATORY_CARE_PROVIDER_SITE_OTHER): Payer: Self-pay | Admitting: Physician Assistant

## 2018-05-02 ENCOUNTER — Ambulatory Visit (INDEPENDENT_AMBULATORY_CARE_PROVIDER_SITE_OTHER): Payer: Medicare Other | Admitting: Physician Assistant

## 2018-05-02 DIAGNOSIS — M7062 Trochanteric bursitis, left hip: Secondary | ICD-10-CM | POA: Diagnosis not present

## 2018-05-02 MED ORDER — METHYLPREDNISOLONE ACETATE 40 MG/ML IJ SUSP
40.0000 mg | INTRAMUSCULAR | Status: AC | PRN
  Administered 2018-05-02: 40 mg via INTRA_ARTICULAR

## 2018-05-02 MED ORDER — LIDOCAINE HCL 1 % IJ SOLN
2.0000 mL | INTRAMUSCULAR | Status: AC | PRN
  Administered 2018-05-02: 2 mL

## 2018-05-02 NOTE — Progress Notes (Signed)
   Procedure Note  Patient: Beverly Jimenez             Date of Birth: [birth year]           MRN: [id number]             Visit Date: 05/02/2018  HPI: Beverly Jimenez is well-known to Dr. Trevor Mace service comes in today complaining knee pain.  She had a cortisone injection with Dr. Lennie Odor on 10/25/2017 and then had a Monovisc injection left knee 12/18/2017.  States she is having swelling in the knee.  Having pain lateral aspect of her hip down to her knee.  She is had no new injury.  Physical exam: Left hip good range of motion.  Tenderness over the trochanteric region tenderness down the IT band.  Left knee no effusion abnormal warmth erythema.  Good range of motion of the knee without pain.   Procedures: Visit Diagnoses: Trochanteric bursitis, left hip  Large Joint Inj: L greater trochanter on 05/02/2018 4:14 PM Indications: pain Details: 22 G 1.5 in needle, lateral approach  Arthrogram: No  Medications: 40 mg methylPREDNISolone acetate 40 MG/ML; 2 mL lidocaine 1 % Outcome: tolerated well, no immediate complications Procedure, treatment alternatives, risks and benefits explained, specific risks discussed. Consent was given by the patient. Immediately prior to procedure a time out was called to verify the correct patient, procedure, equipment, support staff and site/side marked as required. Patient was prepped and draped in the usual sterile fashion.     Plan: She will follow-up with [place] on as-needed basis.  I did give her a prescription for home health/PT to work on her left hip for left hip bursitis.  Questions encouraged and answered with patient and her daughters present today.

## 2018-05-02 NOTE — Telephone Encounter (Signed)
Patient asked if Dr. Brigitte Pulse at [facility] can receive her information from today's visit. A medical release form was completed.  The number to contact patient is needed is [phone]

## 2018-05-03 DIAGNOSIS — Z8619 Personal history of other infectious and parasitic diseases: Secondary | ICD-10-CM | POA: Diagnosis not present

## 2018-05-03 DIAGNOSIS — M6281 Muscle weakness (generalized): Secondary | ICD-10-CM | POA: Diagnosis not present

## 2018-05-03 DIAGNOSIS — J45909 Unspecified asthma, uncomplicated: Secondary | ICD-10-CM | POA: Diagnosis not present

## 2018-05-03 DIAGNOSIS — I10 Essential (primary) hypertension: Secondary | ICD-10-CM | POA: Diagnosis not present

## 2018-05-03 NOTE — Telephone Encounter (Signed)
FAXED

## 2018-05-04 DIAGNOSIS — J45909 Unspecified asthma, uncomplicated: Secondary | ICD-10-CM | POA: Diagnosis not present

## 2018-05-04 DIAGNOSIS — I10 Essential (primary) hypertension: Secondary | ICD-10-CM | POA: Diagnosis not present

## 2018-05-04 DIAGNOSIS — Z8619 Personal history of other infectious and parasitic diseases: Secondary | ICD-10-CM | POA: Diagnosis not present

## 2018-05-04 DIAGNOSIS — M6281 Muscle weakness (generalized): Secondary | ICD-10-CM | POA: Diagnosis not present

## 2018-05-08 DIAGNOSIS — I10 Essential (primary) hypertension: Secondary | ICD-10-CM | POA: Diagnosis not present

## 2018-05-08 DIAGNOSIS — J45909 Unspecified asthma, uncomplicated: Secondary | ICD-10-CM | POA: Diagnosis not present

## 2018-05-08 DIAGNOSIS — Z8619 Personal history of other infectious and parasitic diseases: Secondary | ICD-10-CM | POA: Diagnosis not present

## 2018-05-08 DIAGNOSIS — M6281 Muscle weakness (generalized): Secondary | ICD-10-CM | POA: Diagnosis not present

## 2018-05-10 DIAGNOSIS — I10 Essential (primary) hypertension: Secondary | ICD-10-CM | POA: Diagnosis not present

## 2018-05-10 DIAGNOSIS — Z8619 Personal history of other infectious and parasitic diseases: Secondary | ICD-10-CM | POA: Diagnosis not present

## 2018-05-10 DIAGNOSIS — M6281 Muscle weakness (generalized): Secondary | ICD-10-CM | POA: Diagnosis not present

## 2018-05-10 DIAGNOSIS — J45909 Unspecified asthma, uncomplicated: Secondary | ICD-10-CM | POA: Diagnosis not present

## 2018-05-16 DIAGNOSIS — J45909 Unspecified asthma, uncomplicated: Secondary | ICD-10-CM | POA: Diagnosis not present

## 2018-05-16 DIAGNOSIS — I10 Essential (primary) hypertension: Secondary | ICD-10-CM | POA: Diagnosis not present

## 2018-05-16 DIAGNOSIS — M6281 Muscle weakness (generalized): Secondary | ICD-10-CM | POA: Diagnosis not present

## 2018-05-16 DIAGNOSIS — Z8619 Personal history of other infectious and parasitic diseases: Secondary | ICD-10-CM | POA: Diagnosis not present

## 2018-05-22 DIAGNOSIS — I10 Essential (primary) hypertension: Secondary | ICD-10-CM | POA: Diagnosis not present

## 2018-05-22 DIAGNOSIS — M6281 Muscle weakness (generalized): Secondary | ICD-10-CM | POA: Diagnosis not present

## 2018-05-22 DIAGNOSIS — J45909 Unspecified asthma, uncomplicated: Secondary | ICD-10-CM | POA: Diagnosis not present

## 2018-05-22 DIAGNOSIS — Z8619 Personal history of other infectious and parasitic diseases: Secondary | ICD-10-CM | POA: Diagnosis not present

## 2018-05-23 DIAGNOSIS — I10 Essential (primary) hypertension: Secondary | ICD-10-CM | POA: Diagnosis not present

## 2018-05-23 DIAGNOSIS — Z8619 Personal history of other infectious and parasitic diseases: Secondary | ICD-10-CM | POA: Diagnosis not present

## 2018-05-23 DIAGNOSIS — J45909 Unspecified asthma, uncomplicated: Secondary | ICD-10-CM | POA: Diagnosis not present

## 2018-05-23 DIAGNOSIS — M6281 Muscle weakness (generalized): Secondary | ICD-10-CM | POA: Diagnosis not present

## 2018-05-29 DIAGNOSIS — I10 Essential (primary) hypertension: Secondary | ICD-10-CM | POA: Diagnosis not present

## 2018-05-29 DIAGNOSIS — J45909 Unspecified asthma, uncomplicated: Secondary | ICD-10-CM | POA: Diagnosis not present

## 2018-05-29 DIAGNOSIS — M6281 Muscle weakness (generalized): Secondary | ICD-10-CM | POA: Diagnosis not present

## 2018-05-29 DIAGNOSIS — Z8619 Personal history of other infectious and parasitic diseases: Secondary | ICD-10-CM | POA: Diagnosis not present

## 2018-05-30 DIAGNOSIS — Z8619 Personal history of other infectious and parasitic diseases: Secondary | ICD-10-CM | POA: Diagnosis not present

## 2018-05-30 DIAGNOSIS — I10 Essential (primary) hypertension: Secondary | ICD-10-CM | POA: Diagnosis not present

## 2018-05-30 DIAGNOSIS — J45909 Unspecified asthma, uncomplicated: Secondary | ICD-10-CM | POA: Diagnosis not present

## 2018-05-30 DIAGNOSIS — M6281 Muscle weakness (generalized): Secondary | ICD-10-CM | POA: Diagnosis not present

## 2018-06-05 DIAGNOSIS — I10 Essential (primary) hypertension: Secondary | ICD-10-CM | POA: Diagnosis not present

## 2018-06-05 DIAGNOSIS — Z8619 Personal history of other infectious and parasitic diseases: Secondary | ICD-10-CM | POA: Diagnosis not present

## 2018-06-05 DIAGNOSIS — M6281 Muscle weakness (generalized): Secondary | ICD-10-CM | POA: Diagnosis not present

## 2018-06-05 DIAGNOSIS — J45909 Unspecified asthma, uncomplicated: Secondary | ICD-10-CM | POA: Diagnosis not present

## 2018-07-02 DIAGNOSIS — R195 Other fecal abnormalities: Secondary | ICD-10-CM | POA: Diagnosis not present

## 2018-07-24 DIAGNOSIS — D333 Benign neoplasm of cranial nerves: Secondary | ICD-10-CM | POA: Diagnosis not present

## 2018-07-24 DIAGNOSIS — H903 Sensorineural hearing loss, bilateral: Secondary | ICD-10-CM | POA: Diagnosis not present

## 2018-08-03 DIAGNOSIS — A0472 Enterocolitis due to Clostridium difficile, not specified as recurrent: Secondary | ICD-10-CM | POA: Diagnosis not present

## 2018-08-03 DIAGNOSIS — R197 Diarrhea, unspecified: Secondary | ICD-10-CM | POA: Diagnosis not present

## 2018-08-27 ENCOUNTER — Ambulatory Visit (INDEPENDENT_AMBULATORY_CARE_PROVIDER_SITE_OTHER): Payer: Medicare Other | Admitting: Physician Assistant

## 2018-08-27 ENCOUNTER — Encounter (INDEPENDENT_AMBULATORY_CARE_PROVIDER_SITE_OTHER): Payer: Self-pay | Admitting: Physician Assistant

## 2018-08-27 VITALS — BP 131/64

## 2018-08-27 DIAGNOSIS — M1712 Unilateral primary osteoarthritis, left knee: Secondary | ICD-10-CM

## 2018-08-27 MED ORDER — METHYLPREDNISOLONE ACETATE 40 MG/ML IJ SUSP
40.0000 mg | INTRAMUSCULAR | Status: AC | PRN
  Administered 2018-08-27: 40 mg via INTRA_ARTICULAR

## 2018-08-27 MED ORDER — LIDOCAINE HCL 1 % IJ SOLN
3.0000 mL | INTRAMUSCULAR | Status: AC | PRN
Start: 2018-08-27 — End: 2018-08-27
  Administered 2018-08-27: 3 mL

## 2018-08-27 NOTE — Progress Notes (Signed)
Office Visit Note   Patient: Beverly Jimenez           Date of Birth: [birth year]           MRN: [id number] Visit Date: 08/27/2018              Requested by: Marton Redwood, MD [address] [place], [place] [address] PCP: Marton Redwood, MD   Assessment & Plan: Visit Diagnoses:  1. Primary osteoarthritis of left knee     Plan: Beverly Jimenez is given a handout on supplemental injection.  I think she could benefit from supplemental injection knee as she gets short relief with cortisone injections in the knee.  We will try to gain approval for supplemental injection in the left knee and see her back once this is available.  Follow-Up Instructions: Return for Supplemental injection.   Orders:  Orders Placed This Encounter  Procedures  . Large Joint Inj   No orders of the defined types were placed in this encounter.     Procedures: Large Joint Inj: L knee on 08/27/2018 11:22 AM Indications: pain Details: 22 G 1.5 in needle, anterolateral approach  Arthrogram: No  Medications: 3 mL lidocaine 1 %; 40 mg methylPREDNISolone acetate 40 MG/ML Outcome: tolerated well, no immediate complications Procedure, treatment alternatives, risks and benefits explained, specific risks discussed. Consent was given by the patient. Immediately prior to procedure a time out was called to verify the correct patient, procedure, equipment, support staff and site/side marked as required. Patient was prepped and draped in the usual sterile fashion.       Clinical Data: No additional findings.   Subjective: Chief Complaint  Patient presents with  . Left Knee - Pain  . Left Shoulder - Pain    HPI Beverly Jimenez returns today complaining of left knee pain.  She states she is having trouble with kneeling.  We last saw her in January of this year and she is given a cortisone injection in the knee at that time so for 4 to 6 weeks and then her pain returned.  She has had no new injury to the knee.  Having  no mechanical symptoms in the knee.  Pain is mainly along medial joint line.  She does also note that she is having some left shoulder discomfort but has had no injury to the shoulder we also saw her back in January for this shoulder pain.  States that shoulders to sore she does not want an injection in it.  Using muscle rubs on the knee and Tylenol for pain. Review of Systems See HPI otherwise negative  Objective: Vital Signs: BP 131/64   Physical Exam  Constitutional: She is oriented to person, place, and time. She appears well-developed and well-nourished. No distress.  Pulmonary/Chest: Effort normal.  Neurological: She is alert and oriented to person, place, and time.  Skin: She is not diaphoretic.    Ortho Exam Left shoulder good range of motion without pain.  5 out of 5 strength with external and internal rotation against resistance.  Impingement testing is negative. Left knee good range of motion.  No effusion abnormal warmth erythema.  Slight tenderness along the medial joint line.  Calf supple nontender. Specialty Comments:  No specialty comments available.  Imaging: No results found.   PMFS History: Patient Active Problem List   Diagnosis Date Noted  . AKI (acute kidney injury) ([place]) 04/04/2018  . Pseudomembranous colitis 04/04/2018  . Hypokalemia 04/04/2018  . Hypotension 04/04/2018  . Leucocytosis  04/04/2018  . Acute pain of left knee 10/25/2017  . Hemorrhage of rectum and anus 09/18/2017  . Chronic left-sided low back pain with left-sided sciatica 03/27/2017  . Influenza 01/17/2017  . Influenza A 01/16/2017  . HTN (hypertension) 01/16/2017  . Microcytic anemia 01/16/2017  . Acute hyponatremia 01/16/2017  . Asthma 01/16/2017  . UTI (urinary tract infection) 01/16/2017  . Anemia   . Respiratory distress   . Neck pain 01/11/2017  . Acute pain of left shoulder 01/11/2017  . Left leg pain 01/06/2015  . Varicose veins of both lower extremities with complications  35/32/9924  . HNP (herniated nucleus pulposus), lumbar 07/05/2013   Past Medical History:  Diagnosis Date  . Anemia   . Anxiety   . Arthritis    shoulder and knee pain  . Asthma   . Asthma   . Constipation   . Depression    Hx; of situational depression  . GERD (gastroesophageal reflux disease)   . Glaucoma    Hx; of  . High cholesterol   . History of hiatal hernia   . Myalgia   . Numbness and tingling    Hx: of B/LLE  . Osteoporosis   . Pneumonia    Hx: of "years ago"  . Shortness of breath     Family History  Problem Relation Age of Onset  . Varicose Veins Mother   . Diabetes Brother   . Heart disease Brother   . Hypertension Brother   . Varicose Veins Brother   . Hypertension Other   . Heart disease Other   . Glaucoma Other   . Diabetes Other   . Varicose Veins Sister   . Varicose Veins Daughter     Past Surgical History:  Procedure Laterality Date  . APPENDECTOMY    . BACK SURGERY    . CHOLECYSTECTOMY    . COLONOSCOPY     Hx: of  . COLONOSCOPY WITH PROPOFOL N/A 09/18/2017   Procedure: COLONOSCOPY WITH PROPOFOL;  Surgeon: Wilford Corner, MD;  Location: [facility];  Service: Endoscopy;  Laterality: N/A;  . EYE SURGERY     catarac  . 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 HERNIA REPAIR    . LUMBAR LAMINECTOMY/DECOMPRESSION MICRODISCECTOMY Left 07/05/2013   Procedure: LUMBAR LAMINECTOMY/DECOMPRESSION MICRODISCECTOMY 1 LEVEL Left Lumbar five-Sacral one;  Surgeon: Charlie Pitter, MD;  Location: [facility];  Service: Neurosurgery;  Laterality: Left;  Left Lumbar Five-Sacral One Microdiskectomy  . VASCULAR SURGERY     varicose vein surgery   Social History   Occupational History  . Not on file  Tobacco Use  . Smoking status: Never Smoker  . Smokeless tobacco: Never Used  Substance and Sexual Activity  . Alcohol use: No  . Drug use: No  . Sexual activity: Not on file

## 2018-08-28 ENCOUNTER — Telehealth (INDEPENDENT_AMBULATORY_CARE_PROVIDER_SITE_OTHER): Payer: Self-pay

## 2018-08-28 NOTE — Telephone Encounter (Signed)
Left knee monovisc  

## 2018-08-30 ENCOUNTER — Telehealth (INDEPENDENT_AMBULATORY_CARE_PROVIDER_SITE_OTHER): Payer: Self-pay

## 2018-08-30 NOTE — Telephone Encounter (Signed)
Submitted VOB for Monovisc, left knee. 

## 2018-08-30 NOTE — Telephone Encounter (Signed)
Noted  

## 2018-09-05 ENCOUNTER — Telehealth (INDEPENDENT_AMBULATORY_CARE_PROVIDER_SITE_OTHER): Payer: Self-pay

## 2018-09-05 NOTE — Telephone Encounter (Signed)
Talked with patient and advised her that she is approved to have Monovisc, left knee injection, but patient stated that she would like to hold off at this time and would give [place] a call back when she is ready to proceed.  Monovisc, Left Knee Buy & Bill Covered at 100% through insurance No Co-pay No PA required

## 2018-09-18 DIAGNOSIS — H9212 Otorrhea, left ear: Secondary | ICD-10-CM | POA: Diagnosis not present

## 2018-09-27 ENCOUNTER — Ambulatory Visit (INDEPENDENT_AMBULATORY_CARE_PROVIDER_SITE_OTHER): Payer: Medicare Other | Admitting: Physician Assistant

## 2018-09-27 ENCOUNTER — Encounter (INDEPENDENT_AMBULATORY_CARE_PROVIDER_SITE_OTHER): Payer: Self-pay | Admitting: Physician Assistant

## 2018-09-27 DIAGNOSIS — M7062 Trochanteric bursitis, left hip: Secondary | ICD-10-CM | POA: Diagnosis not present

## 2018-09-27 DIAGNOSIS — M1712 Unilateral primary osteoarthritis, left knee: Secondary | ICD-10-CM | POA: Diagnosis not present

## 2018-09-27 MED ORDER — HYALURONAN 88 MG/4ML IX SOSY
88.0000 mg | PREFILLED_SYRINGE | INTRA_ARTICULAR | Status: AC | PRN
  Administered 2018-09-27: 88 mg via INTRA_ARTICULAR

## 2018-09-27 NOTE — Progress Notes (Signed)
   Procedure Note  Patient: Beverly Jimenez             Date of Birth: May 07, 1925           MRN: [id number]             Visit Date: 09/27/2018  HPI: Mrs. Beverly Jimenez comes in today for Monovisc injection left hip.  She is also complaining of some pain down the lateral aspect of her left leg.  She has known osteoarthritis of the left knee.   Physical exam: Left hip good range of motion without pain.  She has tenderness down the entire IT band.  Significant tenderness over the left trochanteric region.  Left knee she has tenderness along medial lateral joint line no instability valgus varus stressing.  No abnormal warmth erythema positive edema plus minus effusion. Procedures: Visit Diagnoses: Primary osteoarthritis of left knee  Trochanteric bursitis, left hip  Large Joint Inj on 09/27/2018 4:07 PM Indications: pain Details: 22 G 1.5 in needle, anterolateral approach  Arthrogram: No  Medications: 88 mg Hyaluronan 88 MG/4ML Outcome: tolerated well, no immediate complications Procedure, treatment alternatives, risks and benefits explained, specific risks discussed. Consent was given by the patient. Immediately prior to procedure a time out was called to verify the correct patient, procedure, equipment, support staff and site/side marked as required. Patient was prepped and draped in the usual sterile fashion.    Plan: I explained to her her son who is present today that at light for her to do IT band stretching exercises pain persist will consider a trochanteric injection.  In regards to the knee we did try to aspirate the knee and a small amount of synovial fluid.  She is able to get Monovisc injection every 6 months and cortisone injection or more often than every 3 months.  Questions were encouraged and answered at length.  She will follow-up as needed.

## 2018-10-10 DIAGNOSIS — R195 Other fecal abnormalities: Secondary | ICD-10-CM | POA: Diagnosis not present

## 2018-10-17 DIAGNOSIS — H401131 Primary open-angle glaucoma, bilateral, mild stage: Secondary | ICD-10-CM | POA: Diagnosis not present

## 2018-10-17 DIAGNOSIS — Z961 Presence of intraocular lens: Secondary | ICD-10-CM | POA: Diagnosis not present

## 2018-10-23 DIAGNOSIS — Z1389 Encounter for screening for other disorder: Secondary | ICD-10-CM | POA: Diagnosis not present

## 2018-10-23 DIAGNOSIS — A0472 Enterocolitis due to Clostridium difficile, not specified as recurrent: Secondary | ICD-10-CM | POA: Diagnosis not present

## 2018-10-23 DIAGNOSIS — Z6829 Body mass index (BMI) 29.0-29.9, adult: Secondary | ICD-10-CM | POA: Diagnosis not present

## 2018-10-23 DIAGNOSIS — D6489 Other specified anemias: Secondary | ICD-10-CM | POA: Diagnosis not present

## 2018-10-23 DIAGNOSIS — J45998 Other asthma: Secondary | ICD-10-CM | POA: Diagnosis not present

## 2018-10-23 DIAGNOSIS — M810 Age-related osteoporosis without current pathological fracture: Secondary | ICD-10-CM | POA: Diagnosis not present

## 2018-10-23 DIAGNOSIS — Z23 Encounter for immunization: Secondary | ICD-10-CM | POA: Diagnosis not present

## 2018-10-23 DIAGNOSIS — I10 Essential (primary) hypertension: Secondary | ICD-10-CM | POA: Diagnosis not present

## 2018-10-23 DIAGNOSIS — R197 Diarrhea, unspecified: Secondary | ICD-10-CM | POA: Diagnosis not present

## 2018-10-23 DIAGNOSIS — R7301 Impaired fasting glucose: Secondary | ICD-10-CM | POA: Diagnosis not present

## 2018-10-24 DIAGNOSIS — D649 Anemia, unspecified: Secondary | ICD-10-CM | POA: Diagnosis not present

## 2018-10-26 DIAGNOSIS — R197 Diarrhea, unspecified: Secondary | ICD-10-CM | POA: Diagnosis not present

## 2018-10-26 DIAGNOSIS — A0472 Enterocolitis due to Clostridium difficile, not specified as recurrent: Secondary | ICD-10-CM | POA: Diagnosis not present

## 2018-10-31 DIAGNOSIS — R195 Other fecal abnormalities: Secondary | ICD-10-CM | POA: Diagnosis not present

## 2018-11-09 DIAGNOSIS — D509 Iron deficiency anemia, unspecified: Secondary | ICD-10-CM | POA: Diagnosis not present

## 2018-11-09 DIAGNOSIS — R195 Other fecal abnormalities: Secondary | ICD-10-CM | POA: Diagnosis not present

## 2018-11-12 ENCOUNTER — Encounter (INDEPENDENT_AMBULATORY_CARE_PROVIDER_SITE_OTHER): Payer: Self-pay | Admitting: Physician Assistant

## 2018-11-12 ENCOUNTER — Ambulatory Visit (INDEPENDENT_AMBULATORY_CARE_PROVIDER_SITE_OTHER): Payer: Medicare Other | Admitting: Physician Assistant

## 2018-11-12 ENCOUNTER — Ambulatory Visit (INDEPENDENT_AMBULATORY_CARE_PROVIDER_SITE_OTHER): Payer: Medicare Other

## 2018-11-12 DIAGNOSIS — G8929 Other chronic pain: Secondary | ICD-10-CM | POA: Diagnosis not present

## 2018-11-12 DIAGNOSIS — M25562 Pain in left knee: Secondary | ICD-10-CM

## 2018-11-12 NOTE — Progress Notes (Signed)
HPI: [name] returns today status post left knee injection 09/27/2018.  She states the injection did help.  She is complaining mostly of pain down the left leg that goes down into the foot.  She has had no new injury.  She is having no low back pain.  She denies any numbness tingling down either leg.  She has renal disease.  Also she is still recovering from C. difficile which she had earlier this year.  Radiographs of her lumbar spine dated 02/27/2017 showed patient be status post L3-L5 fusion with no hardware failure.  Grade 1 anterior spondylolisthesis L2 on L3. She is tried formal physical therapy for IT band stretching gait balance without any real relief of the pain down the left leg.  She is unable take NSAIDs due to her renal disease.  She is hesitant to try any steroid injections at this point time due to her recent C. difficile.  Radiographs: AP and lateral views left knee: No acute fracture.  Mild narrowing lateral joint line.  Mild patellar femoral changes. Knee otherwise well located.   Physical exam: General well-developed well-nourished female no acute distress mood and affect appropriate. Psych: Alert and oriented x3 Bilateral hip she has good range of motion of both hips without pain.  Tenderness over the trochanteric region of both hips.  She has tenderness down the entire left leg from the lateral hip to the left knee.  Tenderness is global about the left knee.  She also has tenderness medial aspect of the right knee.  5 out of 5 strength throughout the lower extremities against resistance.  Negative straight leg raise bilaterally.  Nontender over the lower lumbar spine.  Impression: Left leg radicular pain  Plan we will obtain a MRI of her lumbar spine to rule out HNP as a source of her pain down the left leg have her follow-up after the MRI to go over the results of this in spite of treatment.  Suggested that she continue to do IT band stretching to help with the trochanteric  bursitis which she has back bilaterally.  Questions encouraged and answered.

## 2018-11-14 ENCOUNTER — Other Ambulatory Visit (INDEPENDENT_AMBULATORY_CARE_PROVIDER_SITE_OTHER): Payer: Self-pay

## 2018-11-14 DIAGNOSIS — M5442 Lumbago with sciatica, left side: Principal | ICD-10-CM

## 2018-11-14 DIAGNOSIS — G8929 Other chronic pain: Secondary | ICD-10-CM

## 2018-11-14 DIAGNOSIS — M4807 Spinal stenosis, lumbosacral region: Secondary | ICD-10-CM

## 2018-12-12 DIAGNOSIS — D509 Iron deficiency anemia, unspecified: Secondary | ICD-10-CM | POA: Diagnosis not present

## 2018-12-13 ENCOUNTER — Ambulatory Visit
Admission: RE | Admit: 2018-12-13 | Discharge: 2018-12-13 | Disposition: A | Discharge status: Home / Self Care | Payer: Medicare Other | Source: Ambulatory Visit | Attending: Orthopaedic Surgery | Admitting: Orthopaedic Surgery

## 2018-12-13 DIAGNOSIS — M5126 Other intervertebral disc displacement, lumbar region: Secondary | ICD-10-CM | POA: Diagnosis not present

## 2018-12-13 DIAGNOSIS — M48061 Spinal stenosis, lumbar region without neurogenic claudication: Secondary | ICD-10-CM | POA: Diagnosis not present

## 2018-12-13 DIAGNOSIS — M4807 Spinal stenosis, lumbosacral region: Secondary | ICD-10-CM

## 2018-12-26 ENCOUNTER — Ambulatory Visit (INDEPENDENT_AMBULATORY_CARE_PROVIDER_SITE_OTHER): Payer: Medicare Other | Admitting: Physician Assistant

## 2018-12-26 ENCOUNTER — Encounter (INDEPENDENT_AMBULATORY_CARE_PROVIDER_SITE_OTHER): Payer: Self-pay | Admitting: Physician Assistant

## 2018-12-26 DIAGNOSIS — M4807 Spinal stenosis, lumbosacral region: Secondary | ICD-10-CM | POA: Diagnosis not present

## 2018-12-26 NOTE — Progress Notes (Signed)
HPI: [name] returns today status post MRI of her lumbar spine.  She continues to have pain that radiates down her left leg into ankle /foot region.  Denies any back pain.  History of lumbar surgery in 2012 by Dr. Annette Stable.  MRI lumbar spine dated 12/13/2018 is reviewed with patient and her daughters present today.  Images are reviewed with patient and her daughter today at length.  MRI showed posterior lumbar interbody fusions at L3-4 and L4-5 L5 to be solid without any's evidence of stenosis.  L2-3 with severe spinal stenosis which has progressed since last MRI and severe articular and foraminal stenosis at this level also.  Impression: Lumbar radiculopathy left leg  Plan: Discussed with patient and her daughter that we could refer her back to Dr. Trenton Gammon for possible further intervention they are not interested in any type of surgery due to her age and this is completely understandable.  Therefore we will refer her back to Dr. Jiles Crocker for possible epidural steroid injection or other interventions.  She will follow-up with [place] as needed.

## 2018-12-27 ENCOUNTER — Other Ambulatory Visit (INDEPENDENT_AMBULATORY_CARE_PROVIDER_SITE_OTHER): Payer: Self-pay

## 2018-12-27 DIAGNOSIS — H9212 Otorrhea, left ear: Secondary | ICD-10-CM | POA: Diagnosis not present

## 2018-12-27 DIAGNOSIS — J3489 Other specified disorders of nose and nasal sinuses: Secondary | ICD-10-CM | POA: Diagnosis not present

## 2019-01-17 DIAGNOSIS — I10 Essential (primary) hypertension: Secondary | ICD-10-CM | POA: Diagnosis not present

## 2019-01-17 DIAGNOSIS — M48062 Spinal stenosis, lumbar region with neurogenic claudication: Secondary | ICD-10-CM | POA: Diagnosis not present

## 2019-01-17 DIAGNOSIS — M961 Postlaminectomy syndrome, not elsewhere classified: Secondary | ICD-10-CM | POA: Diagnosis not present

## 2019-01-17 DIAGNOSIS — M5416 Radiculopathy, lumbar region: Secondary | ICD-10-CM | POA: Diagnosis not present

## 2019-01-17 DIAGNOSIS — Z6827 Body mass index (BMI) 27.0-27.9, adult: Secondary | ICD-10-CM | POA: Diagnosis not present

## 2019-02-07 DIAGNOSIS — H9212 Otorrhea, left ear: Secondary | ICD-10-CM | POA: Diagnosis not present

## 2019-02-18 DIAGNOSIS — Z961 Presence of intraocular lens: Secondary | ICD-10-CM | POA: Diagnosis not present

## 2019-02-18 DIAGNOSIS — H401131 Primary open-angle glaucoma, bilateral, mild stage: Secondary | ICD-10-CM | POA: Diagnosis not present

## 2019-04-02 DIAGNOSIS — Z961 Presence of intraocular lens: Secondary | ICD-10-CM | POA: Diagnosis not present

## 2019-04-02 DIAGNOSIS — H401131 Primary open-angle glaucoma, bilateral, mild stage: Secondary | ICD-10-CM | POA: Diagnosis not present

## 2019-04-22 DIAGNOSIS — D509 Iron deficiency anemia, unspecified: Secondary | ICD-10-CM | POA: Diagnosis not present

## 2019-04-24 DIAGNOSIS — M810 Age-related osteoporosis without current pathological fracture: Secondary | ICD-10-CM | POA: Diagnosis not present

## 2019-04-24 DIAGNOSIS — I10 Essential (primary) hypertension: Secondary | ICD-10-CM | POA: Diagnosis not present

## 2019-04-24 DIAGNOSIS — R7301 Impaired fasting glucose: Secondary | ICD-10-CM | POA: Diagnosis not present

## 2019-04-24 DIAGNOSIS — R82998 Other abnormal findings in urine: Secondary | ICD-10-CM | POA: Diagnosis not present

## 2019-04-24 DIAGNOSIS — E7800 Pure hypercholesterolemia, unspecified: Secondary | ICD-10-CM | POA: Diagnosis not present

## 2019-04-25 DIAGNOSIS — M961 Postlaminectomy syndrome, not elsewhere classified: Secondary | ICD-10-CM | POA: Diagnosis not present

## 2019-04-25 DIAGNOSIS — Z6827 Body mass index (BMI) 27.0-27.9, adult: Secondary | ICD-10-CM | POA: Diagnosis not present

## 2019-04-25 DIAGNOSIS — I10 Essential (primary) hypertension: Secondary | ICD-10-CM | POA: Diagnosis not present

## 2019-04-25 DIAGNOSIS — M48062 Spinal stenosis, lumbar region with neurogenic claudication: Secondary | ICD-10-CM | POA: Diagnosis not present

## 2019-04-25 DIAGNOSIS — M5416 Radiculopathy, lumbar region: Secondary | ICD-10-CM | POA: Diagnosis not present

## 2019-04-26 DIAGNOSIS — H401111 Primary open-angle glaucoma, right eye, mild stage: Secondary | ICD-10-CM | POA: Diagnosis not present

## 2019-05-01 DIAGNOSIS — R5383 Other fatigue: Secondary | ICD-10-CM | POA: Diagnosis not present

## 2019-05-01 DIAGNOSIS — J45909 Unspecified asthma, uncomplicated: Secondary | ICD-10-CM | POA: Diagnosis not present

## 2019-05-01 DIAGNOSIS — I10 Essential (primary) hypertension: Secondary | ICD-10-CM | POA: Diagnosis not present

## 2019-05-01 DIAGNOSIS — Z0000 Encounter for general adult medical examination without abnormal findings: Secondary | ICD-10-CM | POA: Diagnosis not present

## 2019-05-01 DIAGNOSIS — M810 Age-related osteoporosis without current pathological fracture: Secondary | ICD-10-CM | POA: Diagnosis not present

## 2019-05-01 DIAGNOSIS — K219 Gastro-esophageal reflux disease without esophagitis: Secondary | ICD-10-CM | POA: Diagnosis not present

## 2019-05-01 DIAGNOSIS — M5416 Radiculopathy, lumbar region: Secondary | ICD-10-CM | POA: Diagnosis not present

## 2019-05-01 DIAGNOSIS — E871 Hypo-osmolality and hyponatremia: Secondary | ICD-10-CM | POA: Diagnosis not present

## 2019-05-01 DIAGNOSIS — E785 Hyperlipidemia, unspecified: Secondary | ICD-10-CM | POA: Diagnosis not present

## 2019-05-01 DIAGNOSIS — R7301 Impaired fasting glucose: Secondary | ICD-10-CM | POA: Diagnosis not present

## 2019-05-01 DIAGNOSIS — D649 Anemia, unspecified: Secondary | ICD-10-CM | POA: Diagnosis not present

## 2019-05-22 DIAGNOSIS — H401121 Primary open-angle glaucoma, left eye, mild stage: Secondary | ICD-10-CM | POA: Diagnosis not present

## 2019-06-05 DIAGNOSIS — R197 Diarrhea, unspecified: Secondary | ICD-10-CM | POA: Diagnosis not present

## 2019-06-19 ENCOUNTER — Telehealth: Payer: Self-pay | Admitting: Orthopaedic Surgery

## 2019-06-19 NOTE — Telephone Encounter (Signed)
Received call from pts son Jeneen Rinks. He is requesting release for records form. I emailed to him per his request [email or web address]

## 2019-07-09 DIAGNOSIS — M545 Low back pain: Secondary | ICD-10-CM | POA: Diagnosis not present

## 2019-07-09 DIAGNOSIS — M25512 Pain in left shoulder: Secondary | ICD-10-CM | POA: Diagnosis not present

## 2019-07-10 DIAGNOSIS — H401131 Primary open-angle glaucoma, bilateral, mild stage: Secondary | ICD-10-CM | POA: Diagnosis not present

## 2019-07-10 DIAGNOSIS — Z961 Presence of intraocular lens: Secondary | ICD-10-CM | POA: Diagnosis not present

## 2019-07-12 DIAGNOSIS — M25562 Pain in left knee: Secondary | ICD-10-CM | POA: Diagnosis not present

## 2019-07-12 DIAGNOSIS — M1712 Unilateral primary osteoarthritis, left knee: Secondary | ICD-10-CM | POA: Diagnosis not present

## 2019-08-16 DIAGNOSIS — R197 Diarrhea, unspecified: Secondary | ICD-10-CM | POA: Diagnosis not present

## 2019-08-20 DIAGNOSIS — H9212 Otorrhea, left ear: Secondary | ICD-10-CM | POA: Diagnosis not present

## 2019-08-22 DIAGNOSIS — M25562 Pain in left knee: Secondary | ICD-10-CM | POA: Diagnosis not present

## 2019-08-27 DIAGNOSIS — D649 Anemia, unspecified: Secondary | ICD-10-CM | POA: Diagnosis not present

## 2019-08-27 DIAGNOSIS — I10 Essential (primary) hypertension: Secondary | ICD-10-CM | POA: Diagnosis not present

## 2019-08-27 DIAGNOSIS — J45909 Unspecified asthma, uncomplicated: Secondary | ICD-10-CM | POA: Diagnosis not present

## 2019-08-27 DIAGNOSIS — M1712 Unilateral primary osteoarthritis, left knee: Secondary | ICD-10-CM | POA: Diagnosis not present

## 2019-08-29 DIAGNOSIS — D649 Anemia, unspecified: Secondary | ICD-10-CM | POA: Diagnosis not present

## 2019-08-29 DIAGNOSIS — J45909 Unspecified asthma, uncomplicated: Secondary | ICD-10-CM | POA: Diagnosis not present

## 2019-08-29 DIAGNOSIS — M1712 Unilateral primary osteoarthritis, left knee: Secondary | ICD-10-CM | POA: Diagnosis not present

## 2019-08-29 DIAGNOSIS — I10 Essential (primary) hypertension: Secondary | ICD-10-CM | POA: Diagnosis not present

## 2019-09-02 DIAGNOSIS — D649 Anemia, unspecified: Secondary | ICD-10-CM | POA: Diagnosis not present

## 2019-09-02 DIAGNOSIS — M1712 Unilateral primary osteoarthritis, left knee: Secondary | ICD-10-CM | POA: Diagnosis not present

## 2019-09-02 DIAGNOSIS — I10 Essential (primary) hypertension: Secondary | ICD-10-CM | POA: Diagnosis not present

## 2019-09-02 DIAGNOSIS — J45909 Unspecified asthma, uncomplicated: Secondary | ICD-10-CM | POA: Diagnosis not present

## 2019-09-05 DIAGNOSIS — I10 Essential (primary) hypertension: Secondary | ICD-10-CM | POA: Diagnosis not present

## 2019-09-05 DIAGNOSIS — D6489 Other specified anemias: Secondary | ICD-10-CM | POA: Diagnosis not present

## 2019-09-05 DIAGNOSIS — E669 Obesity, unspecified: Secondary | ICD-10-CM | POA: Diagnosis not present

## 2019-09-05 DIAGNOSIS — E7849 Other hyperlipidemia: Secondary | ICD-10-CM | POA: Diagnosis not present

## 2019-09-05 DIAGNOSIS — R7301 Impaired fasting glucose: Secondary | ICD-10-CM | POA: Diagnosis not present

## 2019-09-05 DIAGNOSIS — M810 Age-related osteoporosis without current pathological fracture: Secondary | ICD-10-CM | POA: Diagnosis not present

## 2019-09-05 DIAGNOSIS — K219 Gastro-esophageal reflux disease without esophagitis: Secondary | ICD-10-CM | POA: Diagnosis not present

## 2019-09-05 DIAGNOSIS — Z23 Encounter for immunization: Secondary | ICD-10-CM | POA: Diagnosis not present

## 2019-09-06 DIAGNOSIS — M1712 Unilateral primary osteoarthritis, left knee: Secondary | ICD-10-CM | POA: Diagnosis not present

## 2019-09-06 DIAGNOSIS — J45909 Unspecified asthma, uncomplicated: Secondary | ICD-10-CM | POA: Diagnosis not present

## 2019-09-06 DIAGNOSIS — D649 Anemia, unspecified: Secondary | ICD-10-CM | POA: Diagnosis not present

## 2019-09-06 DIAGNOSIS — I10 Essential (primary) hypertension: Secondary | ICD-10-CM | POA: Diagnosis not present

## 2019-09-10 DIAGNOSIS — J45909 Unspecified asthma, uncomplicated: Secondary | ICD-10-CM | POA: Diagnosis not present

## 2019-09-10 DIAGNOSIS — I10 Essential (primary) hypertension: Secondary | ICD-10-CM | POA: Diagnosis not present

## 2019-09-10 DIAGNOSIS — D649 Anemia, unspecified: Secondary | ICD-10-CM | POA: Diagnosis not present

## 2019-09-10 DIAGNOSIS — M1712 Unilateral primary osteoarthritis, left knee: Secondary | ICD-10-CM | POA: Diagnosis not present

## 2019-09-11 ENCOUNTER — Other Ambulatory Visit (HOSPITAL_COMMUNITY): Payer: Self-pay

## 2019-09-11 DIAGNOSIS — D649 Anemia, unspecified: Secondary | ICD-10-CM | POA: Diagnosis not present

## 2019-09-11 DIAGNOSIS — I10 Essential (primary) hypertension: Secondary | ICD-10-CM | POA: Diagnosis not present

## 2019-09-11 DIAGNOSIS — M1712 Unilateral primary osteoarthritis, left knee: Secondary | ICD-10-CM | POA: Diagnosis not present

## 2019-09-11 DIAGNOSIS — J45909 Unspecified asthma, uncomplicated: Secondary | ICD-10-CM | POA: Diagnosis not present

## 2019-09-12 ENCOUNTER — Other Ambulatory Visit: Payer: Self-pay

## 2019-09-12 ENCOUNTER — Ambulatory Visit (HOSPITAL_COMMUNITY)
Admission: RE | Admit: 2019-09-12 | Discharge: 2019-09-12 | Disposition: A | Discharge status: Home / Self Care | Payer: Medicare Other | Source: Ambulatory Visit | Attending: Internal Medicine | Admitting: Internal Medicine

## 2019-09-12 DIAGNOSIS — M810 Age-related osteoporosis without current pathological fracture: Secondary | ICD-10-CM | POA: Insufficient documentation

## 2019-09-12 MED ORDER — ZOLEDRONIC ACID 5 MG/100ML IV SOLN
5.0000 mg | Freq: Once | INTRAVENOUS | Status: AC
  Administered 2019-09-12: 5 mg via INTRAVENOUS

## 2019-09-12 MED ORDER — ZOLEDRONIC ACID 5 MG/100ML IV SOLN
INTRAVENOUS | Status: AC
  Filled 2019-09-12: qty 100

## 2019-09-16 DIAGNOSIS — M1712 Unilateral primary osteoarthritis, left knee: Secondary | ICD-10-CM | POA: Diagnosis not present

## 2019-09-16 DIAGNOSIS — D649 Anemia, unspecified: Secondary | ICD-10-CM | POA: Diagnosis not present

## 2019-09-16 DIAGNOSIS — I10 Essential (primary) hypertension: Secondary | ICD-10-CM | POA: Diagnosis not present

## 2019-09-16 DIAGNOSIS — J45909 Unspecified asthma, uncomplicated: Secondary | ICD-10-CM | POA: Diagnosis not present

## 2019-09-18 DIAGNOSIS — I10 Essential (primary) hypertension: Secondary | ICD-10-CM | POA: Diagnosis not present

## 2019-09-18 DIAGNOSIS — M1712 Unilateral primary osteoarthritis, left knee: Secondary | ICD-10-CM | POA: Diagnosis not present

## 2019-09-18 DIAGNOSIS — J45909 Unspecified asthma, uncomplicated: Secondary | ICD-10-CM | POA: Diagnosis not present

## 2019-09-18 DIAGNOSIS — D649 Anemia, unspecified: Secondary | ICD-10-CM | POA: Diagnosis not present

## 2019-09-23 DIAGNOSIS — D649 Anemia, unspecified: Secondary | ICD-10-CM | POA: Diagnosis not present

## 2019-09-23 DIAGNOSIS — M1712 Unilateral primary osteoarthritis, left knee: Secondary | ICD-10-CM | POA: Diagnosis not present

## 2019-09-23 DIAGNOSIS — I10 Essential (primary) hypertension: Secondary | ICD-10-CM | POA: Diagnosis not present

## 2019-09-23 DIAGNOSIS — J45909 Unspecified asthma, uncomplicated: Secondary | ICD-10-CM | POA: Diagnosis not present

## 2019-09-25 ENCOUNTER — Other Ambulatory Visit: Payer: Self-pay | Admitting: Gastroenterology

## 2019-09-25 DIAGNOSIS — I10 Essential (primary) hypertension: Secondary | ICD-10-CM | POA: Diagnosis not present

## 2019-09-25 DIAGNOSIS — M1712 Unilateral primary osteoarthritis, left knee: Secondary | ICD-10-CM | POA: Diagnosis not present

## 2019-09-25 DIAGNOSIS — D649 Anemia, unspecified: Secondary | ICD-10-CM | POA: Diagnosis not present

## 2019-09-25 DIAGNOSIS — R1013 Epigastric pain: Secondary | ICD-10-CM

## 2019-09-25 DIAGNOSIS — J45909 Unspecified asthma, uncomplicated: Secondary | ICD-10-CM | POA: Diagnosis not present

## 2019-09-26 DIAGNOSIS — M1712 Unilateral primary osteoarthritis, left knee: Secondary | ICD-10-CM | POA: Diagnosis not present

## 2019-09-26 DIAGNOSIS — D649 Anemia, unspecified: Secondary | ICD-10-CM | POA: Diagnosis not present

## 2019-09-26 DIAGNOSIS — J45909 Unspecified asthma, uncomplicated: Secondary | ICD-10-CM | POA: Diagnosis not present

## 2019-09-26 DIAGNOSIS — I10 Essential (primary) hypertension: Secondary | ICD-10-CM | POA: Diagnosis not present

## 2019-09-30 DIAGNOSIS — D649 Anemia, unspecified: Secondary | ICD-10-CM | POA: Diagnosis not present

## 2019-09-30 DIAGNOSIS — J45909 Unspecified asthma, uncomplicated: Secondary | ICD-10-CM | POA: Diagnosis not present

## 2019-09-30 DIAGNOSIS — I10 Essential (primary) hypertension: Secondary | ICD-10-CM | POA: Diagnosis not present

## 2019-09-30 DIAGNOSIS — M1712 Unilateral primary osteoarthritis, left knee: Secondary | ICD-10-CM | POA: Diagnosis not present

## 2019-10-02 ENCOUNTER — Other Ambulatory Visit: Payer: Self-pay | Admitting: Gastroenterology

## 2019-10-02 ENCOUNTER — Ambulatory Visit
Admission: RE | Admit: 2019-10-02 | Discharge: 2019-10-02 | Disposition: A | Discharge status: Home / Self Care | Payer: Medicare Other | Source: Ambulatory Visit | Attending: Gastroenterology | Admitting: Gastroenterology

## 2019-10-02 DIAGNOSIS — R1013 Epigastric pain: Secondary | ICD-10-CM

## 2019-10-02 DIAGNOSIS — K5710 Diverticulosis of small intestine without perforation or abscess without bleeding: Secondary | ICD-10-CM | POA: Diagnosis not present

## 2019-10-09 ENCOUNTER — Other Ambulatory Visit: Payer: Medicare Other

## 2019-10-29 DIAGNOSIS — J3489 Other specified disorders of nose and nasal sinuses: Secondary | ICD-10-CM | POA: Diagnosis not present

## 2019-10-29 DIAGNOSIS — H9212 Otorrhea, left ear: Secondary | ICD-10-CM | POA: Diagnosis not present

## 2019-10-29 DIAGNOSIS — H903 Sensorineural hearing loss, bilateral: Secondary | ICD-10-CM | POA: Diagnosis not present

## 2019-12-24 DIAGNOSIS — M5416 Radiculopathy, lumbar region: Secondary | ICD-10-CM | POA: Diagnosis not present

## 2019-12-24 DIAGNOSIS — M48062 Spinal stenosis, lumbar region with neurogenic claudication: Secondary | ICD-10-CM | POA: Diagnosis not present

## 2020-01-06 HISTORY — PX: BACK SURGERY: SHX140

## 2020-01-15 DIAGNOSIS — M48062 Spinal stenosis, lumbar region with neurogenic claudication: Secondary | ICD-10-CM | POA: Diagnosis not present

## 2020-01-16 ENCOUNTER — Other Ambulatory Visit: Payer: Self-pay | Admitting: Neurosurgery

## 2020-01-16 DIAGNOSIS — H9212 Otorrhea, left ear: Secondary | ICD-10-CM | POA: Diagnosis not present

## 2020-01-18 DIAGNOSIS — Z23 Encounter for immunization: Secondary | ICD-10-CM | POA: Diagnosis not present

## 2020-01-23 NOTE — Pre-Procedure Instructions (Signed)
[facility] ([address] [place] [id number] [place]) [place] [address] Phone: [phone] Fax: [phone]     Your procedure is scheduled on Tuesday February 23rd.  Report to [facility] Main Entrance "A" at 6:00 A.M., and check in at the Admitting office.  Call this number if you have problems the morning of surgery:  [phone]  Call [phone] if you have any questions prior to your surgery date Monday-Friday 8am-4pm    Remember:  Do not eat or drink after midnight the night before your surgery   Take these medicines the morning of surgery with A SIP OF WATER  esomeprazole (NEXIUM)  Fluticasone-Salmeterol (ADVAIR)  loratadine (CLARITIN)  acetaminophen (TYLENOL) if needed albuterol (PROVENTIL HFA;VENTOLIN HFA) if needed ALPRAZolam Duanne Moron) if needed traMADol (ULTRAM) if needed  Follow your surgeon's instructions on when to stop Asprin.  If no instructions were given by your surgeon then you will need to call the office to get those instructions.     7 days prior to surgery STOP taking any Aspirin (unless otherwise instructed by your surgeon), Aleve, Naproxen, Ibuprofen, Motrin, Advil, Goody's, BC's, all herbal medications, fish oil, and all vitamins.    The Morning of Surgery  Do not wear jewelry, make-up or nail polish.  Do not wear lotions, powders, or perfumes/colognes, or deodorant  Do not shave 48 hours prior to surgery.  Men may shave face and neck.  Do not bring valuables to the hospital.  [facility] is not responsible for any belongings or valuables.  If you are a smoker, DO NOT Smoke 24 hours prior to surgery  If you wear a CPAP at night please bring your mask the morning of surgery   Remember that you must have someone to transport you home after your surgery, and remain with you for 24 hours if you are discharged the same day.   Please bring cases for contacts, glasses, hearing aids, dentures or bridgework  because it cannot be worn into surgery.    Leave your suitcase in the car.  After surgery it may be brought to your room.  For patients admitted to the hospital, discharge time will be determined by your treatment team.  Patients discharged the day of surgery will not be allowed to drive home.    Special instructions:   [facility]- Preparing For Surgery  Before surgery, you can play an important role. Because skin is not sterile, your skin needs to be as free of germs as possible. You can reduce the number of germs on your skin by washing with CHG (chlorahexidine gluconate) Soap before surgery.  CHG is an antiseptic cleaner which kills germs and bonds with the skin to continue killing germs even after washing.    Oral Hygiene is also important to reduce your risk of infection.  Remember - BRUSH YOUR TEETH THE MORNING OF SURGERY WITH YOUR REGULAR TOOTHPASTE  Please do not use if you have an allergy to CHG or antibacterial soaps. If your skin becomes reddened/irritated stop using the CHG.  Do not shave (including legs and underarms) for at least 48 hours prior to first CHG shower. It is OK to shave your face.  Please follow these instructions carefully.   1. Shower the NIGHT BEFORE SURGERY and the MORNING OF SURGERY with CHG Soap.   2. If you chose to wash your hair, wash your hair first as usual with your normal shampoo.  3. After you shampoo, rinse  your hair and body thoroughly to remove the shampoo.  4. Use CHG as you would any other liquid soap. You can apply CHG directly to the skin and wash gently with a scrungie or a clean washcloth.   5. Apply the CHG Soap to your body ONLY FROM THE NECK DOWN.  Do not use on open wounds or open sores. Avoid contact with your eyes, ears, mouth and genitals (private parts). Wash Face and genitals (private parts)  with your normal soap.   6. Wash thoroughly, paying special attention to the area where your surgery will be  performed.  7. Thoroughly rinse your body with warm water from the neck down.  8. DO NOT shower/wash with your normal soap after using and rinsing off the CHG Soap.  9. Pat yourself dry with a CLEAN TOWEL.  10. Wear CLEAN PAJAMAS to bed the night before surgery, wear comfortable clothes the morning of surgery  11. Place CLEAN SHEETS on your bed the night of your first shower and DO NOT SLEEP WITH PETS.    Day of Surgery:  Please shower the morning of surgery with the CHG soap Do not apply any deodorants/lotions. Please wear clean clothes to the hospital/surgery center.   Remember to brush your teeth WITH YOUR REGULAR TOOTHPASTE.   Please read over the following fact sheets that you were given.

## 2020-01-24 ENCOUNTER — Other Ambulatory Visit: Payer: Self-pay

## 2020-01-24 ENCOUNTER — Encounter (HOSPITAL_COMMUNITY): Payer: Self-pay

## 2020-01-24 ENCOUNTER — Encounter (HOSPITAL_COMMUNITY)
Admission: RE | Admit: 2020-01-24 | Discharge: 2020-01-24 | Disposition: A | Discharge status: Home / Self Care | Payer: Medicare Other | Source: Ambulatory Visit | Attending: Neurosurgery | Admitting: Neurosurgery

## 2020-01-24 DIAGNOSIS — I10 Essential (primary) hypertension: Secondary | ICD-10-CM | POA: Diagnosis not present

## 2020-01-24 DIAGNOSIS — Z01818 Encounter for other preprocedural examination: Secondary | ICD-10-CM | POA: Insufficient documentation

## 2020-01-24 DIAGNOSIS — D649 Anemia, unspecified: Secondary | ICD-10-CM | POA: Insufficient documentation

## 2020-01-24 HISTORY — DX: Essential (primary) hypertension: I10

## 2020-01-24 HISTORY — DX: Presence of external hearing-aid: Z97.4

## 2020-01-24 HISTORY — DX: Spinal stenosis, lumbar region without neurogenic claudication: M48.061

## 2020-01-24 LAB — CBC WITH DIFFERENTIAL/PLATELET
Abs Immature Granulocytes: 0.03 10*3/uL (ref 0.00–0.07)
Basophils Absolute: 0 10*3/uL (ref 0.0–0.1)
Basophils Relative: 1 %
Eosinophils Absolute: 0.3 10*3/uL (ref 0.0–0.5)
Eosinophils Relative: 3 %
HCT: 28.2 % — ABNORMAL LOW (ref 36.0–46.0)
Hemoglobin: 8.8 g/dL — ABNORMAL LOW (ref 12.0–15.0)
Immature Granulocytes: 0 %
Lymphocytes Relative: 20 %
Lymphs Abs: 1.5 10*3/uL (ref 0.7–4.0)
MCH: 26.7 pg (ref 26.0–34.0)
MCHC: 31.2 g/dL (ref 30.0–36.0)
MCV: 85.5 fL (ref 80.0–100.0)
Monocytes Absolute: 0.6 10*3/uL (ref 0.1–1.0)
Monocytes Relative: 8 %
Neutro Abs: 5 10*3/uL (ref 1.7–7.7)
Neutrophils Relative %: 68 %
Platelets: 263 10*3/uL (ref 150–400)
RBC: 3.3 MIL/uL — ABNORMAL LOW (ref 3.87–5.11)
RDW: 14 % (ref 11.5–15.5)
WBC: 7.5 10*3/uL (ref 4.0–10.5)
nRBC: 0 % (ref 0.0–0.2)

## 2020-01-24 LAB — SURGICAL PCR SCREEN
MRSA, PCR: NEGATIVE
Staphylococcus aureus: NEGATIVE

## 2020-01-24 LAB — BASIC METABOLIC PANEL
Anion gap: 8 (ref 5–15)
BUN: 9 mg/dL (ref 8–23)
CO2: 25 mmol/L (ref 22–32)
Calcium: 9.7 mg/dL (ref 8.9–10.3)
Chloride: 95 mmol/L — ABNORMAL LOW (ref 98–111)
Creatinine, Ser: 0.76 mg/dL (ref 0.44–1.00)
GFR calc Af Amer: >60 mL/min (ref >60)
GFR calc non Af Amer: >60 mL/min (ref >60)
Glucose, Bld: 118 mg/dL — ABNORMAL HIGH (ref 70–99)
Potassium: 4.5 mmol/L (ref 3.5–5.1)
Sodium: 128 mmol/L — ABNORMAL LOW (ref 135–145)

## 2020-01-24 NOTE — Progress Notes (Signed)
Pt was accompanied by son, Jeneen Rinks, to PAT visit. Pt is extremely HOH. Pt denies SOB, chest pain, and being under the care of a cardiologist. Pt stated that PCP is Dr. Marton Redwood. Pt denies having a stress test, echo and cardiac cath. Pt denies having an EKG and chest x ray in the last year. Pt denies recent labs. Pt reminded to quarantine. Pt verbalized understanding of all pre-op instructions.

## 2020-01-24 NOTE — Pre-Procedure Instructions (Signed)
[place] [facility] ([address]), [place] - [place] [id number] [place] ([place]) [place] [address] Phone: [phone] Fax: [phone]     Your procedure is scheduled on Tuesday, February 23rd.  Report to [facility] Main Entrance "A" at 6:00 A.M., and check in at the Admitting office.  Call this number if you have problems the morning of surgery:  [phone]  Call [phone] if you have any questions prior to your surgery date Monday-Friday 8am-4pm    Remember:  Do not eat or drink after midnight the night before your surgery.   Take these medicines the morning of surgery with A SIP OF WATER: esomeprazole (NEXIUM)      loratadine (CLARITIN) Fluticasone-Salmeterol (ADVAIR)  acetaminophen (TYLENOL) if needed ALPRAZolam Duanne Moron) if needed traMADol (ULTRAM) if needed sodium chloride (OCEAN)  nasal spray if needed     albuterol (PROVENTIL HFA;VENTOLIN HFA) if needed- BRING INHALER IN WITH YOU ON DAY OF SURGERY.  Follow your surgeon's instructions on when to stop Asprin.  If no instructions were given by your surgeon then you will need to call the office to get those instructions.    7 days prior to surgery STOP taking any Aspirin (unless otherwise instructed by your surgeon), Aleve, Naproxen, Ibuprofen, Motrin, Advil, Goody's, BC's, all herbal medications, fish oil, and all vitamins.   The Morning of Surgery  Do not wear jewelry, make-up or nail polish.  Do not wear lotions, powders, or perfumes/colognes, or deodorant  Do not shave 48 hours prior to surgery.  Men may shave face and neck.  Do not bring valuables to the hospital.  [facility] is not responsible for any belongings or valuables.  If you are a smoker, DO NOT Smoke 24 hours prior to surgery  If you wear a CPAP at night please bring your mask the morning of surgery   Remember that you must have someone to transport you home after your surgery, and remain with you for 24 hours if you are discharged  the same day.   Please bring cases for contacts, glasses, hearing aids, dentures or bridgework because it cannot be worn into surgery.    Leave your suitcase in the car.  After surgery it may be brought to your room.  For patients admitted to the hospital, discharge time will be determined by your treatment team.  Patients discharged the day of surgery will not be allowed to drive home.    Special instructions:   [facility]- Preparing For Surgery  Before surgery, you can play an important role. Because skin is not sterile, your skin needs to be as free of germs as possible. You can reduce the number of germs on your skin by washing with CHG (chlorahexidine gluconate) Soap before surgery.  CHG is an antiseptic cleaner which kills germs and bonds with the skin to continue killing germs even after washing.    Oral Hygiene is also important to reduce your risk of infection.  Remember - BRUSH YOUR TEETH THE MORNING OF SURGERY WITH YOUR REGULAR TOOTHPASTE  Please do not use if you have an allergy to CHG or antibacterial soaps. If your skin becomes reddened/irritated stop using the CHG.  Do not shave (including legs and underarms) for at least 48 hours prior to first CHG shower. It is OK to shave your face.  Please follow these instructions carefully.   1. Shower the NIGHT BEFORE SURGERY and the MORNING OF SURGERY with CHG Soap.   2. If you  chose to wash your hair, wash your hair first as usual with your normal shampoo.  3. After you shampoo, rinse your hair and body thoroughly to remove the shampoo.  4. Use CHG as you would any other liquid soap. You can apply CHG directly to the skin and wash gently with a scrungie or a clean washcloth.   5. Apply the CHG Soap to your body ONLY FROM THE NECK DOWN.  Do not use on open wounds or open sores. Avoid contact with your eyes, ears, mouth and genitals (private parts). Wash Face and genitals (private parts)  with your normal soap.   6. Wash  thoroughly, paying special attention to the area where your surgery will be performed.  7. Thoroughly rinse your body with warm water from the neck down.  8. DO NOT shower/wash with your normal soap after using and rinsing off the CHG Soap.  9. Pat yourself dry with a CLEAN TOWEL.  10. Wear CLEAN PAJAMAS to bed the night before surgery, wear comfortable clothes the morning of surgery  11. Place CLEAN SHEETS on your bed the night of your first shower and DO NOT SLEEP WITH PETS.    Day of Surgery:  Please shower the morning of surgery with the CHG soap Do not apply any deodorants/lotions. Please wear clean clothes to the hospital/surgery center.   Remember to brush your teeth WITH YOUR REGULAR TOOTHPASTE.   Please read over the following fact sheets that you were given.

## 2020-01-24 NOTE — Progress Notes (Signed)
Pt chart forwarded to PA, Anesthesiology, to review abnormal labs.

## 2020-01-25 ENCOUNTER — Other Ambulatory Visit (HOSPITAL_COMMUNITY)
Admission: RE | Admit: 2020-01-25 | Discharge: 2020-01-25 | Disposition: A | Discharge status: Home / Self Care | Payer: Medicare Other | Source: Ambulatory Visit | Attending: Neurosurgery | Admitting: Neurosurgery

## 2020-01-25 DIAGNOSIS — Z01812 Encounter for preprocedural laboratory examination: Secondary | ICD-10-CM | POA: Insufficient documentation

## 2020-01-25 DIAGNOSIS — Z20822 Contact with and (suspected) exposure to covid-19: Secondary | ICD-10-CM | POA: Insufficient documentation

## 2020-01-25 LAB — SARS CORONAVIRUS 2 (TAT 6-24 HRS): SARS Coronavirus 2: NEGATIVE

## 2020-01-27 ENCOUNTER — Other Ambulatory Visit: Payer: Self-pay | Admitting: Neurosurgery

## 2020-01-27 NOTE — Progress Notes (Addendum)
Anesthesia Chart Review:  Hx of anemia. Preop labs significant for Hgb 8.8, review of recent labs shows baseline ~10.0. Remainder of labs unremarkable. Lab results were called to Dr. Marchelle Folks office.  EKG 01/24/20: NSR. Rate 80.   Wynonia Musty [facility] Short Stay Center/Anesthesiology Phone [phone] 01/27/2020 10:35 AM

## 2020-01-27 NOTE — Anesthesia Preprocedure Evaluation (Addendum)
Anesthesia Evaluation  Patient identified by MRN, date of birth, ID band Patient awake    Reviewed: Allergy & Precautions, NPO status , Patient's Chart, lab work & pertinent test results  History of Anesthesia Complications Negative for: history of anesthetic complications  Airway Mallampati: II  TM Distance: >3 FB Neck ROM: Full    Dental  (+) Edentulous Upper, Edentulous Lower   Pulmonary shortness of breath, asthma , neg sleep apnea, neg recent URI,    breath sounds clear to auscultation       Cardiovascular hypertension, Pt. on medications (-) angina(-) Past MI and (-) CHF (-) dysrhythmias  Rhythm:Regular     Neuro/Psych PSYCHIATRIC DISORDERS Anxiety Depression  Neuromuscular disease    GI/Hepatic Neg liver ROS, hiatal hernia, GERD  Medicated and Controlled,  Endo/Other  negative endocrine ROSMild hyponatremia  Renal/GU Renal InsufficiencyRenal disease     Musculoskeletal  (+) Arthritis ,   Abdominal   Peds  Hematology  (+) Blood dyscrasia, anemia ,   Anesthesia Other Findings   Reproductive/Obstetrics                            Anesthesia Physical Anesthesia Plan  ASA: III  Anesthesia Plan: General   Post-op Pain Management:    Induction: Intravenous  PONV Risk Score and Plan: 3 and Ondansetron and Dexamethasone  Airway Management Planned: Oral ETT  Additional Equipment: None  Intra-op Plan:   Post-operative Plan: Extubation in OR  Informed Consent: I have reviewed the patients History and Physical, chart, labs and discussed the procedure including the risks, benefits and alternatives for the proposed anesthesia with the patient or authorized representative who has indicated his/her understanding and acceptance.     Dental advisory given  Plan Discussed with: CRNA and Surgeon  Anesthesia Plan Comments: (Hx of anemia. Preop labs significant for Hgb 8.8, review of  recent labs shows baseline ~10.0. Remainder of labs unremarkable. Lab results were called to Dr. Marchelle Folks office.  EKG 01/24/20: NSR. Rate 80. )       Anesthesia Quick Evaluation

## 2020-01-28 ENCOUNTER — Inpatient Hospital Stay (HOSPITAL_COMMUNITY): Payer: Medicare Other | Admitting: Certified Registered"

## 2020-01-28 ENCOUNTER — Inpatient Hospital Stay (HOSPITAL_COMMUNITY): Payer: Medicare Other | Admitting: Physician Assistant

## 2020-01-28 ENCOUNTER — Encounter (HOSPITAL_COMMUNITY): Payer: Self-pay | Admitting: Neurosurgery

## 2020-01-28 ENCOUNTER — Other Ambulatory Visit: Payer: Self-pay

## 2020-01-28 ENCOUNTER — Encounter (HOSPITAL_COMMUNITY)
Admission: RE | Disposition: A | Discharge status: Skilled Nursing Facility | Payer: Self-pay | Source: Home / Self Care | Attending: Neurosurgery

## 2020-01-28 ENCOUNTER — Inpatient Hospital Stay (HOSPITAL_COMMUNITY)
Admission: RE | Admit: 2020-01-28 | Discharge: 2020-01-30 | DRG: 454 | Disposition: A | Discharge status: Skilled Nursing Facility | Payer: Medicare Other | Attending: Neurosurgery | Admitting: Neurosurgery

## 2020-01-28 ENCOUNTER — Inpatient Hospital Stay (HOSPITAL_COMMUNITY): Payer: Medicare Other

## 2020-01-28 DIAGNOSIS — M48061 Spinal stenosis, lumbar region without neurogenic claudication: Secondary | ICD-10-CM | POA: Diagnosis present

## 2020-01-28 DIAGNOSIS — Z79899 Other long term (current) drug therapy: Secondary | ICD-10-CM

## 2020-01-28 DIAGNOSIS — M1990 Unspecified osteoarthritis, unspecified site: Secondary | ICD-10-CM | POA: Diagnosis present

## 2020-01-28 DIAGNOSIS — G9741 Accidental puncture or laceration of dura during a procedure: Secondary | ICD-10-CM | POA: Diagnosis not present

## 2020-01-28 DIAGNOSIS — E876 Hypokalemia: Secondary | ICD-10-CM | POA: Diagnosis not present

## 2020-01-28 DIAGNOSIS — Z20822 Contact with and (suspected) exposure to covid-19: Secondary | ICD-10-CM | POA: Diagnosis present

## 2020-01-28 DIAGNOSIS — E871 Hypo-osmolality and hyponatremia: Secondary | ICD-10-CM | POA: Diagnosis not present

## 2020-01-28 DIAGNOSIS — M4326 Fusion of spine, lumbar region: Secondary | ICD-10-CM | POA: Diagnosis not present

## 2020-01-28 DIAGNOSIS — K219 Gastro-esophageal reflux disease without esophagitis: Secondary | ICD-10-CM | POA: Diagnosis present

## 2020-01-28 DIAGNOSIS — Z888 Allergy status to other drugs, medicaments and biological substances status: Secondary | ICD-10-CM | POA: Diagnosis not present

## 2020-01-28 DIAGNOSIS — D649 Anemia, unspecified: Secondary | ICD-10-CM | POA: Diagnosis present

## 2020-01-28 DIAGNOSIS — Y658 Other specified misadventures during surgical and medical care: Secondary | ICD-10-CM | POA: Diagnosis not present

## 2020-01-28 DIAGNOSIS — I10 Essential (primary) hypertension: Secondary | ICD-10-CM | POA: Diagnosis present

## 2020-01-28 DIAGNOSIS — J45909 Unspecified asthma, uncomplicated: Secondary | ICD-10-CM | POA: Diagnosis present

## 2020-01-28 DIAGNOSIS — M5116 Intervertebral disc disorders with radiculopathy, lumbar region: Secondary | ICD-10-CM | POA: Diagnosis present

## 2020-01-28 DIAGNOSIS — Z4789 Encounter for other orthopedic aftercare: Secondary | ICD-10-CM | POA: Diagnosis not present

## 2020-01-28 DIAGNOSIS — Z880 Allergy status to penicillin: Secondary | ICD-10-CM

## 2020-01-28 DIAGNOSIS — E7800 Pure hypercholesterolemia, unspecified: Secondary | ICD-10-CM | POA: Diagnosis present

## 2020-01-28 DIAGNOSIS — M4316 Spondylolisthesis, lumbar region: Secondary | ICD-10-CM | POA: Diagnosis present

## 2020-01-28 DIAGNOSIS — Z981 Arthrodesis status: Secondary | ICD-10-CM | POA: Diagnosis not present

## 2020-01-28 DIAGNOSIS — Z974 Presence of external hearing-aid: Secondary | ICD-10-CM | POA: Diagnosis not present

## 2020-01-28 DIAGNOSIS — R488 Other symbolic dysfunctions: Secondary | ICD-10-CM | POA: Diagnosis not present

## 2020-01-28 DIAGNOSIS — R5381 Other malaise: Secondary | ICD-10-CM | POA: Diagnosis not present

## 2020-01-28 DIAGNOSIS — Z8249 Family history of ischemic heart disease and other diseases of the circulatory system: Secondary | ICD-10-CM

## 2020-01-28 DIAGNOSIS — E785 Hyperlipidemia, unspecified: Secondary | ICD-10-CM | POA: Diagnosis not present

## 2020-01-28 DIAGNOSIS — Z7401 Bed confinement status: Secondary | ICD-10-CM | POA: Diagnosis not present

## 2020-01-28 DIAGNOSIS — A0472 Enterocolitis due to Clostridium difficile, not specified as recurrent: Secondary | ICD-10-CM | POA: Diagnosis not present

## 2020-01-28 DIAGNOSIS — J45901 Unspecified asthma with (acute) exacerbation: Secondary | ICD-10-CM | POA: Diagnosis not present

## 2020-01-28 DIAGNOSIS — K449 Diaphragmatic hernia without obstruction or gangrene: Secondary | ICD-10-CM | POA: Diagnosis present

## 2020-01-28 DIAGNOSIS — Z833 Family history of diabetes mellitus: Secondary | ICD-10-CM

## 2020-01-28 DIAGNOSIS — M4310 Spondylolisthesis, site unspecified: Secondary | ICD-10-CM | POA: Diagnosis present

## 2020-01-28 DIAGNOSIS — Z419 Encounter for procedure for purposes other than remedying health state, unspecified: Secondary | ICD-10-CM

## 2020-01-28 DIAGNOSIS — Z882 Allergy status to sulfonamides status: Secondary | ICD-10-CM | POA: Diagnosis not present

## 2020-01-28 DIAGNOSIS — M6281 Muscle weakness (generalized): Secondary | ICD-10-CM | POA: Diagnosis not present

## 2020-01-28 DIAGNOSIS — I959 Hypotension, unspecified: Secondary | ICD-10-CM | POA: Diagnosis not present

## 2020-01-28 DIAGNOSIS — M2550 Pain in unspecified joint: Secondary | ICD-10-CM | POA: Diagnosis not present

## 2020-01-28 DIAGNOSIS — M5126 Other intervertebral disc displacement, lumbar region: Secondary | ICD-10-CM | POA: Diagnosis not present

## 2020-01-28 LAB — PREPARE RBC (CROSSMATCH)

## 2020-01-28 SURGERY — POSTERIOR LUMBAR FUSION 1 LEVEL
Anesthesia: General | Site: Back

## 2020-01-28 MED ORDER — PROPOFOL 10 MG/ML IV BOLUS
INTRAVENOUS | Status: DC | PRN
  Administered 2020-01-28 (×2): 30 mg via INTRAVENOUS

## 2020-01-28 MED ORDER — THROMBIN 20000 UNITS EX SOLR
CUTANEOUS | Status: AC
  Filled 2020-01-28: qty 20000

## 2020-01-28 MED ORDER — ACETAMINOPHEN 160 MG/5ML PO SOLN: 1000.0000 mg | Freq: Once | ORAL | Status: DC | PRN

## 2020-01-28 MED ORDER — LACTATED RINGERS IV SOLN: INTRAVENOUS | Status: DC

## 2020-01-28 MED ORDER — VANCOMYCIN HCL IN DEXTROSE 1-5 GM/200ML-% IV SOLN
1000.0000 mg | Freq: Once | INTRAVENOUS | Status: AC
  Administered 2020-01-28: 1000 mg via INTRAVENOUS
  Filled 2020-01-28: qty 200

## 2020-01-28 MED ORDER — FLEET ENEMA 7-19 GM/118ML RE ENEM: 1.0000 | ENEMA | Freq: Once | RECTAL | Status: DC | PRN

## 2020-01-28 MED ORDER — ONDANSETRON HCL 4 MG PO TABS: 4.0000 mg | ORAL_TABLET | Freq: Four times a day (QID) | ORAL | Status: DC | PRN

## 2020-01-28 MED ORDER — SODIUM CHLORIDE 0.9 % IV SOLN
INTRAVENOUS | Status: DC | PRN
  Administered 2020-01-28: 500 mL

## 2020-01-28 MED ORDER — OXYCODONE HCL 5 MG/5ML PO SOLN: 5.0000 mg | Freq: Once | ORAL | Status: DC | PRN

## 2020-01-28 MED ORDER — ACETAMINOPHEN 650 MG RE SUPP: 650.0000 mg | RECTAL | Status: DC | PRN

## 2020-01-28 MED ORDER — POTASSIUM 99 MG PO TABS: 99.0000 mg | ORAL_TABLET | Freq: Every day | ORAL | Status: DC

## 2020-01-28 MED ORDER — FENTANYL CITRATE (PF) 100 MCG/2ML IJ SOLN
25.0000 ug | INTRAMUSCULAR | Status: DC | PRN
  Administered 2020-01-28 (×2): 25 ug via INTRAVENOUS

## 2020-01-28 MED ORDER — ROCURONIUM BROMIDE 10 MG/ML (PF) SYRINGE
PREFILLED_SYRINGE | INTRAVENOUS | Status: AC
  Filled 2020-01-28: qty 10

## 2020-01-28 MED ORDER — FLUTICASONE PROPIONATE 50 MCG/ACT NA SUSP
1.0000 | Freq: Every day | NASAL | Status: DC
  Administered 2020-01-29: 1 via NASAL
  Filled 2020-01-28 (×2): qty 16

## 2020-01-28 MED ORDER — LIDOCAINE 2% (20 MG/ML) 5 ML SYRINGE
INTRAMUSCULAR | Status: DC | PRN
  Administered 2020-01-28: 50 mg via INTRAVENOUS

## 2020-01-28 MED ORDER — SODIUM CHLORIDE 0.9 % IV SOLN: 250.0000 mL | INTRAVENOUS | Status: DC

## 2020-01-28 MED ORDER — ACETAMINOPHEN 10 MG/ML IV SOLN
INTRAVENOUS | Status: AC
  Filled 2020-01-28: qty 100

## 2020-01-28 MED ORDER — SUGAMMADEX SODIUM 200 MG/2ML IV SOLN
INTRAVENOUS | Status: DC | PRN
  Administered 2020-01-28: 160 mg via INTRAVENOUS

## 2020-01-28 MED ORDER — HEMOSTATIC AGENTS (NO CHARGE) OPTIME
TOPICAL | Status: DC | PRN
  Administered 2020-01-28: 1 via TOPICAL

## 2020-01-28 MED ORDER — FENTANYL CITRATE (PF) 100 MCG/2ML IJ SOLN
INTRAMUSCULAR | Status: DC | PRN
  Administered 2020-01-28: 100 ug via INTRAVENOUS
  Administered 2020-01-28 (×3): 50 ug via INTRAVENOUS

## 2020-01-28 MED ORDER — ALBUTEROL SULFATE (2.5 MG/3ML) 0.083% IN NEBU: 3.0000 mL | INHALATION_SOLUTION | RESPIRATORY_TRACT | Status: DC | PRN

## 2020-01-28 MED ORDER — BUPIVACAINE HCL (PF) 0.25 % IJ SOLN
INTRAMUSCULAR | Status: AC
  Filled 2020-01-28: qty 30

## 2020-01-28 MED ORDER — ALIGN 4 MG PO CAPS: ORAL_CAPSULE | Freq: Every morning | ORAL | Status: DC

## 2020-01-28 MED ORDER — ACETAMINOPHEN 10 MG/ML IV SOLN
1000.0000 mg | Freq: Once | INTRAVENOUS | Status: DC | PRN
  Administered 2020-01-28: 1000 mg via INTRAVENOUS

## 2020-01-28 MED ORDER — VANCOMYCIN HCL 1000 MG IV SOLR
INTRAVENOUS | Status: DC | PRN
  Administered 2020-01-28: 1000 mg

## 2020-01-28 MED ORDER — 0.9 % SODIUM CHLORIDE (POUR BTL) OPTIME
TOPICAL | Status: DC | PRN
  Administered 2020-01-28: 1000 mL

## 2020-01-28 MED ORDER — ACETAMINOPHEN 325 MG PO TABS: 650.0000 mg | ORAL_TABLET | ORAL | Status: DC | PRN

## 2020-01-28 MED ORDER — OXYCODONE HCL 5 MG PO TABS
5.0000 mg | ORAL_TABLET | ORAL | Status: DC | PRN
  Administered 2020-01-30: 5 mg via ORAL
  Administered 2020-01-30: 10 mg via ORAL
  Filled 2020-01-28: qty 2

## 2020-01-28 MED ORDER — CHLORHEXIDINE GLUCONATE CLOTH 2 % EX PADS: 6.0000 | MEDICATED_PAD | Freq: Once | CUTANEOUS | Status: DC

## 2020-01-28 MED ORDER — FENTANYL CITRATE (PF) 100 MCG/2ML IJ SOLN
INTRAMUSCULAR | Status: AC
  Filled 2020-01-28: qty 2

## 2020-01-28 MED ORDER — PHENYLEPHRINE 40 MCG/ML (10ML) SYRINGE FOR IV PUSH (FOR BLOOD PRESSURE SUPPORT)
PREFILLED_SYRINGE | INTRAVENOUS | Status: AC
  Filled 2020-01-28: qty 10

## 2020-01-28 MED ORDER — PANTOPRAZOLE SODIUM 40 MG PO TBEC
80.0000 mg | DELAYED_RELEASE_TABLET | Freq: Every day | ORAL | Status: DC
  Filled 2020-01-28: qty 2

## 2020-01-28 MED ORDER — DEXAMETHASONE SODIUM PHOSPHATE 10 MG/ML IJ SOLN
10.0000 mg | Freq: Once | INTRAMUSCULAR | Status: AC
  Administered 2020-01-28: 10 mg via INTRAVENOUS
  Filled 2020-01-28: qty 1

## 2020-01-28 MED ORDER — BUPIVACAINE HCL (PF) 0.25 % IJ SOLN
INTRAMUSCULAR | Status: DC | PRN
  Administered 2020-01-28: 20 mL

## 2020-01-28 MED ORDER — FENTANYL CITRATE (PF) 250 MCG/5ML IJ SOLN
INTRAMUSCULAR | Status: AC
  Filled 2020-01-28: qty 5

## 2020-01-28 MED ORDER — SODIUM CHLORIDE 0.9 % IV SOLN: INTRAVENOUS | Status: DC | PRN

## 2020-01-28 MED ORDER — PROPOFOL 10 MG/ML IV BOLUS
INTRAVENOUS | Status: AC
  Filled 2020-01-28: qty 20

## 2020-01-28 MED ORDER — DORZOLAMIDE HCL 2 % OP SOLN
1.0000 [drp] | Freq: Two times a day (BID) | OPHTHALMIC | Status: DC
  Administered 2020-01-29 – 2020-01-30 (×3): 1 [drp] via OPHTHALMIC
  Filled 2020-01-28: qty 10

## 2020-01-28 MED ORDER — IRBESARTAN 150 MG PO TABS
150.0000 mg | ORAL_TABLET | Freq: Every day | ORAL | Status: DC
  Administered 2020-01-28 – 2020-01-30 (×3): 150 mg via ORAL
  Filled 2020-01-28 (×3): qty 1

## 2020-01-28 MED ORDER — HYDROMORPHONE HCL 1 MG/ML IJ SOLN: 1.0000 mg | INTRAMUSCULAR | Status: DC | PRN

## 2020-01-28 MED ORDER — SODIUM CHLORIDE 0.9% FLUSH
3.0000 mL | Freq: Two times a day (BID) | INTRAVENOUS | Status: DC
  Administered 2020-01-28: 3 mL via INTRAVENOUS

## 2020-01-28 MED ORDER — HYDROCODONE-ACETAMINOPHEN 10-325 MG PO TABS
1.0000 | ORAL_TABLET | ORAL | Status: DC | PRN
  Administered 2020-01-28 – 2020-01-30 (×9): 1 via ORAL
  Filled 2020-01-28 (×9): qty 1

## 2020-01-28 MED ORDER — VITAMIN D 25 MCG (1000 UNIT) PO TABS
1000.0000 [IU] | ORAL_TABLET | Freq: Every day | ORAL | Status: DC
  Administered 2020-01-29 – 2020-01-30 (×2): 1000 [IU] via ORAL
  Filled 2020-01-28 (×2): qty 1

## 2020-01-28 MED ORDER — PANTOPRAZOLE SODIUM 40 MG PO TBEC
40.0000 mg | DELAYED_RELEASE_TABLET | Freq: Two times a day (BID) | ORAL | Status: DC
  Administered 2020-01-28 – 2020-01-30 (×4): 40 mg via ORAL
  Filled 2020-01-28 (×3): qty 1

## 2020-01-28 MED ORDER — THROMBIN 5000 UNITS EX SOLR
CUTANEOUS | Status: AC
  Filled 2020-01-28: qty 5000

## 2020-01-28 MED ORDER — POLYSACCHARIDE IRON COMPLEX 150 MG PO CAPS
150.0000 mg | ORAL_CAPSULE | Freq: Every morning | ORAL | Status: DC
  Administered 2020-01-29 – 2020-01-30 (×2): 150 mg via ORAL
  Filled 2020-01-28 (×2): qty 1

## 2020-01-28 MED ORDER — SODIUM CHLORIDE 0.9% IV SOLUTION: Freq: Once | INTRAVENOUS | Status: DC

## 2020-01-28 MED ORDER — BISACODYL 10 MG RE SUPP: 10.0000 mg | Freq: Every day | RECTAL | Status: DC | PRN

## 2020-01-28 MED ORDER — ONDANSETRON HCL 4 MG/2ML IJ SOLN
INTRAMUSCULAR | Status: AC
  Filled 2020-01-28: qty 2

## 2020-01-28 MED ORDER — DIAZEPAM 5 MG PO TABS
5.0000 mg | ORAL_TABLET | Freq: Four times a day (QID) | ORAL | Status: DC | PRN
  Administered 2020-01-29 – 2020-01-30 (×3): 5 mg via ORAL
  Filled 2020-01-28 (×3): qty 1

## 2020-01-28 MED ORDER — LATANOPROST 0.005 % OP SOLN
1.0000 [drp] | Freq: Every day | OPHTHALMIC | Status: DC
  Administered 2020-01-28 – 2020-01-29 (×2): 1 [drp] via OPHTHALMIC
  Filled 2020-01-28: qty 2.5

## 2020-01-28 MED ORDER — VANCOMYCIN HCL 1000 MG IV SOLR
INTRAVENOUS | Status: AC
  Filled 2020-01-28: qty 1000

## 2020-01-28 MED ORDER — SODIUM CHLORIDE 0.9% FLUSH: 3.0000 mL | INTRAVENOUS | Status: DC | PRN

## 2020-01-28 MED ORDER — MENTHOL 3 MG MT LOZG: 1.0000 | LOZENGE | OROMUCOSAL | Status: DC | PRN

## 2020-01-28 MED ORDER — LORATADINE 10 MG PO TABS: 10.0000 mg | ORAL_TABLET | Freq: Every day | ORAL | Status: DC | PRN

## 2020-01-28 MED ORDER — DOCUSATE SODIUM 100 MG PO CAPS
100.0000 mg | ORAL_CAPSULE | Freq: Every day | ORAL | Status: DC
  Administered 2020-01-29 – 2020-01-30 (×2): 100 mg via ORAL
  Filled 2020-01-28 (×2): qty 1

## 2020-01-28 MED ORDER — HYDROCODONE-ACETAMINOPHEN 5-325 MG PO TABS: 1.0000 | ORAL_TABLET | Freq: Four times a day (QID) | ORAL | Status: DC | PRN

## 2020-01-28 MED ORDER — THROMBIN 20000 UNITS EX SOLR
CUTANEOUS | Status: DC | PRN
  Administered 2020-01-28 (×2): 20 mL via TOPICAL

## 2020-01-28 MED ORDER — POLYETHYLENE GLYCOL 3350 17 G PO PACK: 17.0000 g | PACK | Freq: Every day | ORAL | Status: DC | PRN

## 2020-01-28 MED ORDER — ALPRAZOLAM 0.5 MG PO TABS
0.5000 mg | ORAL_TABLET | Freq: Every day | ORAL | Status: DC
  Administered 2020-01-28 – 2020-01-29 (×2): 0.5 mg via ORAL
  Filled 2020-01-28 (×2): qty 1

## 2020-01-28 MED ORDER — NIACIN 500 MG PO TABS
500.0000 mg | ORAL_TABLET | Freq: Every day | ORAL | Status: DC
  Administered 2020-01-29 – 2020-01-30 (×2): 500 mg via ORAL
  Filled 2020-01-28 (×2): qty 1

## 2020-01-28 MED ORDER — ASCORBIC ACID 500 MG PO TABS
500.0000 mg | ORAL_TABLET | Freq: Every day | ORAL | Status: DC
  Administered 2020-01-29 – 2020-01-30 (×2): 500 mg via ORAL
  Filled 2020-01-28 (×2): qty 1

## 2020-01-28 MED ORDER — ONDANSETRON HCL 4 MG/2ML IJ SOLN: 4.0000 mg | Freq: Four times a day (QID) | INTRAMUSCULAR | Status: DC | PRN

## 2020-01-28 MED ORDER — OMEGA-3 FATTY ACIDS 1000 MG PO CAPS: 1.0000 g | ORAL_CAPSULE | Freq: Two times a day (BID) | ORAL | Status: DC

## 2020-01-28 MED ORDER — ACETAMINOPHEN 500 MG PO TABS: 1000.0000 mg | ORAL_TABLET | Freq: Once | ORAL | Status: DC | PRN

## 2020-01-28 MED ORDER — ROCURONIUM BROMIDE 50 MG/5ML IV SOSY
PREFILLED_SYRINGE | INTRAVENOUS | Status: DC | PRN
  Administered 2020-01-28 (×2): 50 mg via INTRAVENOUS

## 2020-01-28 MED ORDER — CYANOCOBALAMIN 500 MCG PO TABS
500.0000 ug | ORAL_TABLET | Freq: Every day | ORAL | Status: DC
  Administered 2020-01-29 – 2020-01-30 (×2): 500 ug via ORAL
  Filled 2020-01-28 (×2): qty 1

## 2020-01-28 MED ORDER — LIDOCAINE 2% (20 MG/ML) 5 ML SYRINGE
INTRAMUSCULAR | Status: AC
  Filled 2020-01-28: qty 5

## 2020-01-28 MED ORDER — GUAIFENESIN ER 600 MG PO TB12
600.0000 mg | ORAL_TABLET | Freq: Every day | ORAL | Status: DC | PRN
  Filled 2020-01-28: qty 1

## 2020-01-28 MED ORDER — VITAMIN E 180 MG (400 UNIT) PO CAPS: 400.0000 [IU] | ORAL_CAPSULE | Freq: Every day | ORAL | Status: DC

## 2020-01-28 MED ORDER — PHENYLEPHRINE HCL-NACL 10-0.9 MG/250ML-% IV SOLN
INTRAVENOUS | Status: DC | PRN
  Administered 2020-01-28: 50 ug/min via INTRAVENOUS

## 2020-01-28 MED ORDER — MOMETASONE FURO-FORMOTEROL FUM 100-5 MCG/ACT IN AERO
2.0000 | INHALATION_SPRAY | Freq: Two times a day (BID) | RESPIRATORY_TRACT | Status: DC
  Administered 2020-01-29 – 2020-01-30 (×4): 2 via RESPIRATORY_TRACT
  Filled 2020-01-28: qty 8.8

## 2020-01-28 MED ORDER — SALINE SPRAY 0.65 % NA SOLN
1.0000 | NASAL | Status: DC | PRN
  Filled 2020-01-28: qty 44

## 2020-01-28 MED ORDER — VANCOMYCIN HCL IN DEXTROSE 1-5 GM/200ML-% IV SOLN
1000.0000 mg | INTRAVENOUS | Status: AC
  Administered 2020-01-28: 1000 mg via INTRAVENOUS
  Filled 2020-01-28: qty 200

## 2020-01-28 MED ORDER — OXYCODONE HCL 5 MG PO TABS: 5.0000 mg | ORAL_TABLET | Freq: Once | ORAL | Status: DC | PRN

## 2020-01-28 MED ORDER — PHENOL 1.4 % MT LIQD: 1.0000 | OROMUCOSAL | Status: DC | PRN

## 2020-01-28 SURGICAL SUPPLY — 76 items
BAG DECANTER FOR FLEXI CONT (MISCELLANEOUS) ×3 IMPLANT
BENZOIN TINCTURE PRP APPL 2/3 (GAUZE/BANDAGES/DRESSINGS) ×3 IMPLANT
BLADE CLIPPER SURG (BLADE) IMPLANT
BUR CUTTER 7.0 ROUND (BURR) IMPLANT
BUR MATCHSTICK NEURO 3.0 LAGG (BURR) ×3 IMPLANT
CANISTER SUCT 3000ML PPV (MISCELLANEOUS) ×3 IMPLANT
CAP LCK SPNE (Orthopedic Implant) ×4 IMPLANT
CAP LOCK SPINE RADIUS (Orthopedic Implant) IMPLANT
CAP LOCKING (Orthopedic Implant) ×8 IMPLANT
CARTRIDGE OIL MAESTRO DRILL (MISCELLANEOUS) ×1 IMPLANT
CLOSURE STERI-STRIP 1/2X4 (GAUZE/BANDAGES/DRESSINGS) ×1
CLOSURE WOUND 1/2 X4 (GAUZE/BANDAGES/DRESSINGS) ×2
CLSR STERI-STRIP ANTIMIC 1/2X4 (GAUZE/BANDAGES/DRESSINGS) ×1 IMPLANT
CNTNR URN SCR LID CUP LEK RST (MISCELLANEOUS) ×1 IMPLANT
CONT SPEC 4OZ STRL OR WHT (MISCELLANEOUS) ×2
COVER BACK TABLE 60X90IN (DRAPES) ×3 IMPLANT
COVER WAND RF STERILE (DRAPES) ×3 IMPLANT
DECANTER SPIKE VIAL GLASS SM (MISCELLANEOUS) ×3 IMPLANT
DERMABOND ADVANCED (GAUZE/BANDAGES/DRESSINGS) ×2
DERMABOND ADVANCED .7 DNX12 (GAUZE/BANDAGES/DRESSINGS) ×1 IMPLANT
DEVICE INTERBODY ELEVATE 23X8 (Cage) ×4 IMPLANT
DIFFUSER DRILL AIR PNEUMATIC (MISCELLANEOUS) ×3 IMPLANT
DRAPE C-ARM 42X72 X-RAY (DRAPES) ×6 IMPLANT
DRAPE HALF SHEET 40X57 (DRAPES) IMPLANT
DRAPE LAPAROTOMY 100X72X124 (DRAPES) ×3 IMPLANT
DRAPE SURG 17X23 STRL (DRAPES) ×12 IMPLANT
DRSG OPSITE POSTOP 4X6 (GAUZE/BANDAGES/DRESSINGS) ×3 IMPLANT
DURAPREP 26ML APPLICATOR (WOUND CARE) ×3 IMPLANT
ELECT REM PT RETURN 9FT ADLT (ELECTROSURGICAL) ×3
ELECTRODE REM PT RTRN 9FT ADLT (ELECTROSURGICAL) ×1 IMPLANT
EVACUATOR 1/8 PVC DRAIN (DRAIN) IMPLANT
GAUZE 4X4 16PLY RFD (DISPOSABLE) IMPLANT
GAUZE SPONGE 4X4 12PLY STRL (GAUZE/BANDAGES/DRESSINGS) IMPLANT
GLOVE BIO SURGEON STRL SZ 6.5 (GLOVE) ×4 IMPLANT
GLOVE BIO SURGEONS STRL SZ 6.5 (GLOVE) ×3
GLOVE BIOGEL PI IND STRL 6.5 (GLOVE) ×1 IMPLANT
GLOVE BIOGEL PI IND STRL 7.0 (GLOVE) IMPLANT
GLOVE BIOGEL PI IND STRL 7.5 (GLOVE) IMPLANT
GLOVE BIOGEL PI INDICATOR 6.5 (GLOVE) ×4
GLOVE BIOGEL PI INDICATOR 7.0 (GLOVE) ×4
GLOVE BIOGEL PI INDICATOR 7.5 (GLOVE) ×2
GLOVE ECLIPSE 9.0 STRL (GLOVE) ×6 IMPLANT
GLOVE EXAM NITRILE XL STR (GLOVE) IMPLANT
GLOVE SS BIOGEL STRL SZ 7 (GLOVE) IMPLANT
GLOVE SUPERSENSE BIOGEL SZ 7 (GLOVE) ×6
GOWN STRL REUS W/ TWL LRG LVL3 (GOWN DISPOSABLE) IMPLANT
GOWN STRL REUS W/ TWL XL LVL3 (GOWN DISPOSABLE) ×2 IMPLANT
GOWN STRL REUS W/TWL 2XL LVL3 (GOWN DISPOSABLE) IMPLANT
GOWN STRL REUS W/TWL LRG LVL3 (GOWN DISPOSABLE) ×4
GOWN STRL REUS W/TWL XL LVL3 (GOWN DISPOSABLE) ×8
KIT BASIN OR (CUSTOM PROCEDURE TRAY) ×3 IMPLANT
KIT TURNOVER KIT B (KITS) ×3 IMPLANT
MILL MEDIUM DISP (BLADE) ×3 IMPLANT
NEEDLE HYPO 22GX1.5 SAFETY (NEEDLE) ×3 IMPLANT
NS IRRIG 1000ML POUR BTL (IV SOLUTION) ×3 IMPLANT
OIL CARTRIDGE MAESTRO DRILL (MISCELLANEOUS) ×3
PACK LAMINECTOMY NEURO (CUSTOM PROCEDURE TRAY) ×3 IMPLANT
PATTIES SURGICAL 1X1 (DISPOSABLE) ×2 IMPLANT
RASP 3.0MM (RASP) ×2 IMPLANT
ROD RADIUS 35MM (Rod) ×4 IMPLANT
SCREW 5.75X45MM (Screw) ×4 IMPLANT
SEALANT ADHERUS EXTEND TIP (MISCELLANEOUS) ×2 IMPLANT
SPACER SPNL STD 23X8XSTRL (Cage) IMPLANT
SPCR SPNL STD 23X8XSTRL (Cage) ×2 IMPLANT
SPONGE LAP 4X18 RFD (DISPOSABLE) ×2 IMPLANT
SPONGE SURGIFOAM ABS GEL 100 (HEMOSTASIS) ×5 IMPLANT
STRIP CLOSURE SKIN 1/2X4 (GAUZE/BANDAGES/DRESSINGS) ×4 IMPLANT
SUT PROLENE 5 0 C1 (SUTURE) ×4 IMPLANT
SUT VIC AB 0 CT1 18XCR BRD8 (SUTURE) ×2 IMPLANT
SUT VIC AB 0 CT1 8-18 (SUTURE) ×2
SUT VIC AB 2-0 CT1 18 (SUTURE) ×7 IMPLANT
SUT VIC AB 3-0 SH 8-18 (SUTURE) ×6 IMPLANT
TOWEL GREEN STERILE (TOWEL DISPOSABLE) ×3 IMPLANT
TOWEL GREEN STERILE FF (TOWEL DISPOSABLE) ×3 IMPLANT
TRAY FOLEY MTR SLVR 16FR STAT (SET/KITS/TRAYS/PACK) ×3 IMPLANT
WATER STERILE IRR 1000ML POUR (IV SOLUTION) ×3 IMPLANT

## 2020-01-28 NOTE — H&P (Signed)
Chief Complaint: Back and leg pain  HPI: 86 year old female status post prior L3-L5 decompression and fusion with good results many years ago.  Patient presents with progressive lower back and bilateral lower extremity pain numbness and weakness which has become progressively incapacitating.  Has an MRI scan which demonstrates evidence of severe adjacent level stenosis at L2-3.  She has failed conservative management and she presents now for L2-3 decompression and fusion surgery.          Past Medical History:    Diagnosis   Date    .   Anemia        .   Anxiety        .   Arthritis            shoulder and knee pain    .   Asthma        .   Asthma        .   Constipation        .   Depression            Hx; of situational depression    .   GERD (gastroesophageal reflux disease)        .   Glaucoma            Hx; of    .   High cholesterol        .   History of hiatal hernia        .   HOH (hard of hearing)        .   Hypertension        .   Lumbar stenosis        .   Myalgia        .   Numbness and tingling            Hx: of B/LLE    .   Osteoporosis        .   Pneumonia            Hx: of "years ago"    .   Shortness of breath        .   Wears hearing aid            B/L                Past Surgical History:    Procedure   Laterality   Date    .   APPENDECTOMY            .   BACK SURGERY            .   CHOLECYSTECTOMY            .   COLONOSCOPY                Hx: of    .   COLONOSCOPY WITH PROPOFOL   N/A   09/18/2017        Procedure: COLONOSCOPY WITH PROPOFOL;  Surgeon: Wilford Corner, MD;  Location: [facility];  Service: Endoscopy;  Laterality: N/A;    .   EYE SURGERY                catarac    .   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   HERNIA REPAIR            .  LUMBAR LAMINECTOMY/DECOMPRESSION MICRODISCECTOMY   Left   07/05/2013        Procedure: LUMBAR LAMINECTOMY/DECOMPRESSION MICRODISCECTOMY 1 LEVEL Left Lumbar five-Sacral one;  Surgeon: Charlie Pitter, MD;  Location: [facility];  Service: Neurosurgery;  Laterality: Left;  Left Lumbar Five-Sacral One Microdiskectomy    .   MULTIPLE TOOTH EXTRACTIONS            .   VASCULAR SURGERY                varicose vein surgery                Family History    Problem   Relation   Age of Onset    .   Varicose Veins   Mother        .   Diabetes   Brother        .   Heart disease   Brother        .   Hypertension   Brother        .   Varicose Veins   Brother        .   Hypertension   Other        .   Heart disease   Other        .   Glaucoma   Other        .   Diabetes   Other        .   Varicose Veins   Sister        .   Varicose Veins   Daughter           Social History:  reports that she has never smoked. She has never used smokeless tobacco. She reports that she does not drink alcohol or use drugs.     Allergies:         Allergies    Allergen   Reactions    .   Amoxicillin   Hives, Itching, Rash and Other (See Comments)            Has patient had a PCN reaction causing immediate rash, facial/tongue/throat swelling, SOB or lightheadedness with hypotension: Unknown  Has patient had a PCN reaction causing SEVERE rash involving mucus membranes or skin necrosis:    #  #  YES  #  #   Has patient had a PCN reaction that required hospitalization: No  Has patient had a PCN reaction occurring within the last 10 years: No        .   Macrodantin [Nitrofurantoin Macrocrystal]   Shortness Of Breath    .   Penicillins   Other (See Comments)            SEE AMOXICILLIN     .   Sulfa Antibiotics   Shortness Of Breath and Rash    .   Cymbalta [Duloxetine Hcl]                UNSPECIFIED REACTION                  Medications Prior to Admission    Medication   Sig   Dispense   Refill    .   acetaminophen (TYLENOL) 500 MG tablet   Take 1-2 tablets (500-1,000 mg total) by mouth every 8 (eight) hours as needed for mild pain, moderate pain, fever or headache.   30 tablet   0    .   albuterol (  PROVENTIL HFA;VENTOLIN HFA) 108 (90 Base) MCG/ACT inhaler   Inhale 2 puffs into the lungs every 4 (four) hours as needed for wheezing or shortness of breath.   18 g   0    .   ALPRAZolam (XANAX) 0.5 MG tablet   Take 0.5 mg by mouth at bedtime.        5    .   bimatoprost (LUMIGAN) 0.01 % SOLN   Place 1 drop into both eyes at bedtime.            .   Calcium Carbonate-Vit D-Min (CALTRATE PLUS PO)   Take 1 tablet by mouth 2 (two) times daily.             .   Chloramphenicol POWD   Place 1 application into the left ear every Thursday. Chloramphenicol/Sulfamenth/Amphoteracin    [employer]            .   cholecalciferol (VITAMIN D3) 25 MCG (1000 UNIT) tablet   Take 1,000 Units by mouth daily.            .   Cyanocobalamin (VITAMIN B-12 PO)   Place 1 tablet under the tongue daily.            Marland Kitchen   docusate sodium (COLACE) 100 MG capsule   Take 100 mg by mouth daily.            .   dorzolamide (TRUSOPT) 2 % ophthalmic solution   Place 1 drop into both eyes 2 (two) times daily.            .   fish oil-omega-3 fatty acids 1000 MG capsule   Take 1 g by mouth 2 (two) times daily.             .   fluticasone (FLONASE) 50 MCG/ACT nasal spray   Place 1 spray into both nostrils at bedtime.             .   Fluticasone-Salmeterol (ADVAIR) 100-50 MCG/DOSE AEPB   Inhale 1 puff into the lungs 2 (two) times daily.             Marland Kitchen    HYDROcodone-acetaminophen (NORCO/VICODIN) 5-325 MG tablet   Take 1 tablet by mouth every 6 (six) hours as needed for moderate pain.            .   iron polysaccharides (NIFEREX) 150 MG capsule   Take 150 mg by mouth every morning.            .   loratadine (CLARITIN) 10 MG tablet   Take 10 mg by mouth daily as needed for allergies.             .   niacin 500 MG tablet   Take 500 mg by mouth daily with breakfast.            .   olmesartan (BENICAR) 20 MG tablet   Take 20 mg by mouth daily.            .   Potassium 99 MG TABS   Take 99 mg by mouth daily.            .   Probiotic Product (ALIGN PO)   Take 1 capsule by mouth every morning.            .   sodium chloride (OCEAN) 0.65 % SOLN nasal spray   Place 1 spray into both nostrils as needed  for congestion.            .   vitamin C (ASCORBIC ACID) 500 MG tablet   Take 500 mg by mouth daily.            .   vitamin E 400 UNIT capsule   Take 400 Units by mouth daily.            Marland Kitchen   esomeprazole (NEXIUM) 40 MG capsule   Take 40 mg by mouth daily at 12 noon.            Marland Kitchen   guaiFENesin (MUCINEX) 600 MG 12 hr tablet   Take 600 mg by mouth daily as needed for cough.                  Lab Results Last 48 Hours                                                                     Imaging Results (Last 48 hours)          Pertinent items noted in HPI and remainder of comprehensive ROS otherwise negative.     Blood pressure (!) 151/55, pulse 84, temperature 98.4 F (36.9 C), temperature source Oral, resp. rate 19, height 5\' 4"  (1.626 m), weight 79.4 kg, SpO2 100 %.     Patient is awake and alert.  She is oriented and appropriate.  Speech is fluent.  Judgment insight intact.  Cranial nerve function normal bilateral.  Motor examination with normal motor strength bilaterally.  Sensory  examination with decrease sensation pinprick and light touch L3 distally in both lower extremities.  Reflexes hypoactive married.  Gait is antalgic.  Posture is flexed peer examination head ears eyes nose throat is unremarked.  Chest and abdomen are benign.  Extremities are free from injury or deformity.  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  Plan bilateral L2-3 decompressive laminotomies and foraminotomies followed by posterior lumbar interbody fusion utilizing interbody cages, locally harvested autograft, and augmented with posterior letter arthrodesis utilizing nonsegmental pedicle screw fixation and local autografting.  Risks and benefits of been explained.  Patient wishes to proceed.     Patient with known chronic anemia.  Hemoglobin is 8.8.  We will be prepared to transfuse intraoperatively as necessary.  Cooper Render Skeeter Sheard  01/28/2020, 7:53 AM                             Routing History

## 2020-01-28 NOTE — Consult Note (Deleted)
Beverly Jimenez is an 86 y.o. female.   Chief Complaint: Back and leg pain HPI: 86 year old female status post prior L3-L5 decompression and fusion with good results many years ago.  Patient presents with progressive lower back and bilateral lower extremity pain numbness and weakness which has become progressively incapacitating.  Has an MRI scan which demonstrates evidence of severe adjacent level stenosis at L2-3.  She has failed conservative management and she presents now for L2-3 decompression and fusion surgery.  Past Medical History:  Diagnosis Date  . Anemia   . Anxiety   . Arthritis    shoulder and knee pain  . Asthma   . Asthma   . Constipation   . Depression    Hx; of situational depression  . GERD (gastroesophageal reflux disease)   . Glaucoma    Hx; of  . High cholesterol   . History of hiatal hernia   . HOH (hard of hearing)   . Hypertension   . Lumbar stenosis   . Myalgia   . Numbness and tingling    Hx: of B/LLE  . Osteoporosis   . Pneumonia    Hx: of "years ago"  . Shortness of breath   . Wears hearing aid    B/L    Past Surgical History:  Procedure Laterality Date  . APPENDECTOMY    . BACK SURGERY    . CHOLECYSTECTOMY    . COLONOSCOPY     Hx: of  . COLONOSCOPY WITH PROPOFOL N/A 09/18/2017   Procedure: COLONOSCOPY WITH PROPOFOL;  Surgeon: Wilford Corner, MD;  Location: [facility];  Service: Endoscopy;  Laterality: N/A;  . EYE SURGERY     catarac  . 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 HERNIA REPAIR    . LUMBAR LAMINECTOMY/DECOMPRESSION MICRODISCECTOMY Left 07/05/2013   Procedure: LUMBAR LAMINECTOMY/DECOMPRESSION MICRODISCECTOMY 1 LEVEL Left Lumbar five-Sacral one;  Surgeon: Charlie Pitter, MD;  Location: [facility];  Service: Neurosurgery;  Laterality: Left;  Left Lumbar Five-Sacral One Microdiskectomy  . MULTIPLE TOOTH EXTRACTIONS    . VASCULAR SURGERY     varicose vein surgery    Family History  Problem Relation Age of Onset  .  Varicose Veins Mother   . Diabetes Brother   . Heart disease Brother   . Hypertension Brother   . Varicose Veins Brother   . Hypertension Other   . Heart disease Other   . Glaucoma Other   . Diabetes Other   . Varicose Veins Sister   . Varicose Veins Daughter    Social History:  reports that she has never smoked. She has never used smokeless tobacco. She reports that she does not drink alcohol or use drugs.  Allergies:  Allergies  Allergen Reactions  . Amoxicillin Hives, Itching, Rash and Other (See Comments)    Has patient had a PCN reaction causing immediate rash, facial/tongue/throat swelling, SOB or lightheadedness with hypotension: Unknown Has patient had a PCN reaction causing SEVERE rash involving mucus membranes or skin necrosis:    #  #  YES  #  #  Has patient had a PCN reaction that required hospitalization: No Has patient had a PCN reaction occurring within the last 10 years: No    . Macrodantin [Nitrofurantoin Macrocrystal] Shortness Of Breath  . Penicillins Other (See Comments)    SEE AMOXICILLIN  . Sulfa Antibiotics Shortness Of Breath and Rash  . Cymbalta [Duloxetine Hcl]     UNSPECIFIED REACTION  Medications Prior to Admission  Medication Sig Dispense Refill  . acetaminophen (TYLENOL) 500 MG tablet Take 1-2 tablets (500-1,000 mg total) by mouth every 8 (eight) hours as needed for mild pain, moderate pain, fever or headache. 30 tablet 0  . albuterol (PROVENTIL HFA;VENTOLIN HFA) 108 (90 Base) MCG/ACT inhaler Inhale 2 puffs into the lungs every 4 (four) hours as needed for wheezing or shortness of breath. 18 g 0  . ALPRAZolam (XANAX) 0.5 MG tablet Take 0.5 mg by mouth at bedtime.   5  . bimatoprost (LUMIGAN) 0.01 % SOLN Place 1 drop into both eyes at bedtime.    . Calcium Carbonate-Vit D-Min (CALTRATE PLUS PO) Take 1 tablet by mouth 2 (two) times daily.     . Chloramphenicol POWD Place 1 application into the left ear every Thursday.  Chloramphenicol/Sulfamenth/Amphoteracin    [employer]    . cholecalciferol (VITAMIN D3) 25 MCG (1000 UNIT) tablet Take 1,000 Units by mouth daily.    . Cyanocobalamin (VITAMIN B-12 PO) Place 1 tablet under the tongue daily.    Marland Kitchen docusate sodium (COLACE) 100 MG capsule Take 100 mg by mouth daily.    . dorzolamide (TRUSOPT) 2 % ophthalmic solution Place 1 drop into both eyes 2 (two) times daily.    . fish oil-omega-3 fatty acids 1000 MG capsule Take 1 g by mouth 2 (two) times daily.     . fluticasone (FLONASE) 50 MCG/ACT nasal spray Place 1 spray into both nostrils at bedtime.     . Fluticasone-Salmeterol (ADVAIR) 100-50 MCG/DOSE AEPB Inhale 1 puff into the lungs 2 (two) times daily.     Marland Kitchen HYDROcodone-acetaminophen (NORCO/VICODIN) 5-325 MG tablet Take 1 tablet by mouth every 6 (six) hours as needed for moderate pain.    . iron polysaccharides (NIFEREX) 150 MG capsule Take 150 mg by mouth every morning.    . loratadine (CLARITIN) 10 MG tablet Take 10 mg by mouth daily as needed for allergies.     . niacin 500 MG tablet Take 500 mg by mouth daily with breakfast.    . olmesartan (BENICAR) 20 MG tablet Take 20 mg by mouth daily.    . Potassium 99 MG TABS Take 99 mg by mouth daily.    . Probiotic Product (ALIGN PO) Take 1 capsule by mouth every morning.    . sodium chloride (OCEAN) 0.65 % SOLN nasal spray Place 1 spray into both nostrils as needed for congestion.    . vitamin C (ASCORBIC ACID) 500 MG tablet Take 500 mg by mouth daily.    . vitamin E 400 UNIT capsule Take 400 Units by mouth daily.    Marland Kitchen esomeprazole (NEXIUM) 40 MG capsule Take 40 mg by mouth daily at 12 noon.    Marland Kitchen guaiFENesin (MUCINEX) 600 MG 12 hr tablet Take 600 mg by mouth daily as needed for cough.      Results for orders placed or performed during the hospital encounter of 01/28/20 (from the past 48 hour(s))  Prepare RBC (crossmatch)     Status: None   Collection Time: 01/28/20  6:42 AM  Result Value Ref Range   Order  Confirmation      ORDER PROCESSED BY BLOOD BANK Performed at [facility], [address] [place], [place] [address]    No results found.  Pertinent items noted in HPI and remainder of comprehensive ROS otherwise negative.  Blood pressure (!) 151/55, pulse 84, temperature 98.4 F (36.9 C), temperature source Oral, resp. rate 19,  height 5\' 4"  (1.626 m), weight 79.4 kg, SpO2 100 %.  Patient is awake and alert.  She is oriented and appropriate.  Speech is fluent.  Judgment insight intact.  Cranial nerve function normal bilateral.  Motor examination with normal motor strength bilaterally.  Sensory examination with decrease sensation pinprick and light touch L3 distally in both lower extremities.  Reflexes hypoactive married.  Gait is antalgic.  Posture is flexed peer examination head ears eyes nose throat is unremarked.  Chest and abdomen are benign.  Extremities are free from injury or deformity. 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  Plan bilateral L2-3 decompressive laminotomies and foraminotomies followed by posterior lumbar interbody fusion utilizing interbody cages, locally harvested autograft, and augmented with posterior letter arthrodesis utilizing nonsegmental pedicle screw fixation and local autografting.  Risks and benefits of been explained.  Patient wishes to proceed.  Patient with known chronic anemia.  Hemoglobin is 8.8.  We will be prepared to transfuse intraoperatively as necessary. Cooper Render Marquavis Hannen 01/28/2020, 7:53 AM

## 2020-01-28 NOTE — Transfer of Care (Signed)
Immediate Anesthesia Transfer of Care Note  Patient: Beverly Jimenez  Procedure(s) Performed: POSTERIOR LUMBAR INTERBODY FUSION - LUMBAR TWO-LUMBAR THREE (N/A Back)  Patient Location: PACU  Anesthesia Type:General  Level of Consciousness: drowsy and patient cooperative  Airway & Oxygen Therapy: Patient Spontanous Breathing  Post-op Assessment: Report given to RN and Post -op Vital signs reviewed and stable  Post vital signs: Reviewed and stable  Last Vitals:  Vitals Value Taken Time  BP    Temp    Pulse 67 01/28/20 1109  Resp 10 01/28/20 1109  SpO2 100 % 01/28/20 1109  Vitals shown include unvalidated device data.  Last Pain:  Vitals:   01/28/20 0650  TempSrc:   PainSc: 5          Complications: No apparent anesthesia complications

## 2020-01-28 NOTE — TOC Initial Note (Signed)
Transition of Care [facility]) - Initial/Assessment Note    Patient Details  Name: Beverly Jimenez MRN: [id number] Date of Birth: October 26, 1925  Transition of Care [facility]) CM/SW Contact:    Benard Halsted, LCSW Phone Number: 01/28/2020, 2:15 PM  Clinical Narrative:                 CSW received request to speak with patient's son at bedside from [place]. Patient's son is requesting rehab for patient as she lives at home alone. He asked about CIR. CSW explained that patient would need 24 hour supervision post-rehab in order to qualify for CIR, which patient does not have. He expressed understanding. CSW provided SNF Medicare ratings list. He is requesting facilities with higher ratings such as [facility]. CSW will follow up with him once bed offers are available. Patient's son requesting patient to remain in the hospital as long as possible. CSW explained it is up to the MD to determine medical readiness. PT to work with patient. Patient will require updated COVID test 24-48 hours from discharge.   Expected Discharge Plan: Skilled Nursing Facility Barriers to Discharge: Continued Medical Work up   Patient Goals and CMS Choice Patient states their goals for this hospitalization and ongoing recovery are:: Rehab CMS Medicare.gov Compare Post Acute Care list provided to:: Patient Choice offered to / list presented to : Patient, Adult Children(Son)  Expected Discharge Plan and Services Expected Discharge Plan: [place] In-house Referral: Clinical Social Work   Post Acute Care Choice: [place] Living arrangements for the past 2 months: [place]                                      Prior Living Arrangements/Services Living arrangements for the past 2 months: Single Family Home Lives with:: Self Patient language and need for interpreter reviewed:: Yes Do you feel safe going back to the place where you live?: Yes      Need for Family  Participation in Patient Care: Yes (Comment) Care giver support system in place?: Yes (comment)   Criminal Activity/Legal Involvement Pertinent to Current Situation/Hospitalization: No - Comment as needed  Activities of Daily Living      Permission Sought/Granted Permission sought to share information with : Facility Sport and exercise psychologist, Family Supports Permission granted to share information with : Yes, Verbal Permission Granted  Share Information with NAME: Jeneen Rinks  Permission granted to share info w AGENCY: SNFs  Permission granted to share info w Relationship: Son  Permission granted to share info w Contact Information: [phone]  Emotional Assessment Appearance:: Appears stated age Attitude/Demeanor/Rapport: Gracious Affect (typically observed): Accepting, Appropriate, Quiet Orientation: : Oriented to Self, Oriented to Place, Oriented to  Time, Oriented to Situation Alcohol / Substance Use: Not Applicable Psych Involvement: No (comment)  Admission diagnosis:  Degenerative spondylolisthesis [M43.10] Patient Active Problem List   Diagnosis Date Noted  . Degenerative spondylolisthesis 01/28/2020  . AKI (acute kidney injury) ([place]) 04/04/2018  . Pseudomembranous colitis 04/04/2018  . Hypokalemia 04/04/2018  . Hypotension 04/04/2018  . Leucocytosis 04/04/2018  . Acute pain of left knee 10/25/2017  . Hemorrhage of rectum and anus 09/18/2017  . Chronic left-sided low back pain with left-sided sciatica 03/27/2017  . Influenza 01/17/2017  . Influenza A 01/16/2017  . HTN (hypertension) 01/16/2017  . Microcytic anemia 01/16/2017  . Acute hyponatremia 01/16/2017  . Asthma 01/16/2017  . UTI (urinary tract  infection) 01/16/2017  . Anemia   . Respiratory distress   . Neck pain 01/11/2017  . Acute pain of left shoulder 01/11/2017  . Left leg pain 01/06/2015  . Varicose veins of both lower extremities with complications [id number]  . HNP (herniated nucleus pulposus), lumbar  07/05/2013   PCP:  Marton Redwood, MD Pharmacy:   [facility] RK:[id number] [address], [place] [address] Phone: [phone] Fax: [phone]     Social Determinants of Health (SDOH) Interventions    Readmission Risk Interventions No flowsheet data found.

## 2020-01-28 NOTE — Anesthesia Procedure Notes (Signed)
Procedure Name: Intubation Date/Time: 01/28/2020 8:15 AM Performed by: Lance Coon, CRNA Pre-anesthesia Checklist: Patient identified, Emergency Drugs available, Suction available, Patient being monitored and Timeout performed Patient Re-evaluated:Patient Re-evaluated prior to induction Oxygen Delivery Method: Circle system utilized Preoxygenation: Pre-oxygenation with 100% oxygen Induction Type: IV induction Ventilation: Mask ventilation without difficulty Laryngoscope Size: Miller and 2 Grade View: Grade I Tube type: Oral Tube size: 7.0 mm Number of attempts: 1 Airway Equipment and Method: Stylet Placement Confirmation: ETT inserted through vocal cords under direct vision,  positive ETCO2 and breath sounds checked- equal and bilateral Secured at: 21 cm Tube secured with: Tape Dental Injury: Teeth and Oropharynx as per pre-operative assessment

## 2020-01-28 NOTE — Brief Op Note (Signed)
01/28/2020  10:59 AM  PATIENT:  Beverly Jimenez  85 y.o. female  PRE-OPERATIVE DIAGNOSIS:  Stenosis  POST-OPERATIVE DIAGNOSIS:  Stenosis  PROCEDURE:  Procedure(s): POSTERIOR LUMBAR INTERBODY FUSION - LUMBAR TWO-LUMBAR THREE (N/A)  SURGEON:  Surgeon(s) and Role:    * Earnie Larsson, MD - Primary    * Vallarie Mare, MD - Assisting  PHYSICIAN ASSISTANT:   ASSISTANTSMearl Latin   ANESTHESIA:   general  EBL:  350 mL   BLOOD ADMINISTERED: 1 unit packed red blood cells, 140 cc Cell Saver  DRAINS: none   LOCAL MEDICATIONS USED:  MARCAINE     SPECIMEN:  No Specimen  DISPOSITION OF SPECIMEN:  N/A  COUNTS:  YES  TOURNIQUET:  * No tourniquets in log *  DICTATION: .Dragon Dictation  PLAN OF CARE: Admit to inpatient   PATIENT DISPOSITION:  PACU - hemodynamically stable.   Delay start of Pharmacological VTE agent (>24hrs) due to surgical blood loss or risk of bleeding: yes

## 2020-01-28 NOTE — Evaluation (Signed)
Physical Therapy Evaluation Patient Details Name: Beverly Jimenez MRN: [id number] DOB: 22-Mar-1925 Today's Date: 01/28/2020   History of Present Illness  Pt is a 86 y/o female s/p L2-3 PLIF. PMH includes asthma, glaucoma, HTN, osteoporosis, and back surgery.   Clinical Impression  Patient is s/p above surgery resulting in the deficits listed below (see PT Problem List). Pt very guarded during gait, requiring min to mod A for mobility with RW.Pt with increased pain in L hip following transfer from toilet, so ambulated back to bed with mod A. Educated about back precautions. Pt reports she lives alone, so feel she will require SNF level therapies at d/c. Patient will benefit from skilled PT to increase their independence and safety with mobility (while adhering to their precautions) to allow discharge to the venue listed below.     Follow Up Recommendations SNF    Equipment Recommendations  None recommended by PT    Recommendations for Other Services       Precautions / Restrictions Precautions Precautions: Back Precaution Booklet Issued: Yes (comment) Precaution Comments: Reviewed back precautions with pt.  Required Braces or Orthoses: Spinal Brace Spinal Brace: Lumbar corset;Applied in sitting position Restrictions Weight Bearing Restrictions: No      Mobility  Bed Mobility Overal bed mobility: Needs Assistance Bed Mobility: Rolling;Sidelying to Sit;Sit to Sidelying Rolling: Min assist Sidelying to sit: Min assist     Sit to sidelying: Min assist General bed mobility comments: Min A to assist with rolling and for trunk elevation to come to sitting. Required assist for LE assist to return to sidelying. Cues for log roll technique   Transfers Overall transfer level: Needs assistance Equipment used: Rolling walker (2 wheeled) Transfers: Sit to/from Stand Sit to Stand: Min assist;Mod assist         General transfer comment: Min to mod A for lift assist. Cues for hand  placement.   Ambulation/Gait Ambulation/Gait assistance: Min assist;Mod assist Gait Distance (Feet): 15 Feet Assistive device: Rolling walker (2 wheeled) Gait Pattern/deviations: Step-through pattern;Decreased stride length Gait velocity: Decreased   General Gait Details: Slow, antalgic gait. Pt initially requiring min A for steadying assist. However, after transferring off of toilet, pt with increased pain in L hip. Required mod A to make it back to EOB and pt requiring standing rest. Further mobility deferred.   Stairs            Wheelchair Mobility    Modified Rankin (Stroke Patients Only)       Balance Overall balance assessment: Needs assistance Sitting-balance support: No upper extremity supported;Feet supported Sitting balance-Leahy Scale: Fair     Standing balance support: Bilateral upper extremity supported;During functional activity Standing balance-Leahy Scale: Poor Standing balance comment: Reliant on UE and external support                             Pertinent Vitals/Pain Pain Assessment: Faces Faces Pain Scale: Hurts whole lot Pain Location: back Pain Descriptors / Indicators: Aching;Operative site guarding Pain Intervention(s): Limited activity within patient's tolerance;Monitored during session;Repositioned    Home Living Family/patient expects to be discharged to:: Skilled nursing facility Living Arrangements: Alone                    Prior Function Level of Independence: Independent with assistive device(s)         Comments: Was using RW for mobility      Hand Dominance  Extremity/Trunk Assessment   Upper Extremity Assessment Upper Extremity Assessment: Defer to OT evaluation    Lower Extremity Assessment Lower Extremity Assessment: Generalized weakness;LLE deficits/detail LLE Deficits / Details: L hip pain at baseline; reports it is shooting pain     Cervical / Trunk Assessment Cervical / Trunk  Assessment: Other exceptions Cervical / Trunk Exceptions: s/p PLIF   Communication   Communication: No difficulties  Cognition Arousal/Alertness: Awake/alert Behavior During Therapy: WFL for tasks assessed/performed Overall Cognitive Status: Within Functional Limits for tasks assessed                                        General Comments      Exercises     Assessment/Plan    PT Assessment Patient needs continued PT services  PT Problem List Decreased strength;Decreased activity tolerance;Decreased balance;Decreased mobility;Decreased knowledge of use of DME;Decreased knowledge of precautions;Pain       PT Treatment Interventions Gait training;Functional mobility training;Therapeutic activities;Therapeutic exercise;Balance training;DME instruction;Patient/family education    PT Goals (Current goals can be found in the Care Plan section)  Acute Rehab PT Goals Patient Stated Goal: to be able to walk better PT Goal Formulation: With patient Time For Goal Achievement: 02/11/20 Potential to Achieve Goals: Good    Frequency Min 5X/week   Barriers to discharge Decreased caregiver support      Co-evaluation               AM-PAC PT "6 Clicks" Mobility  Outcome Measure Help needed turning from your back to your side while in a flat bed without using bedrails?: A Little Help needed moving from lying on your back to sitting on the side of a flat bed without using bedrails?: A Little Help needed moving to and from a bed to a chair (including a wheelchair)?: A Little Help needed standing up from a chair using your arms (e.g., wheelchair or bedside chair)?: A Lot Help needed to walk in hospital room?: A Lot Help needed climbing 3-5 steps with a railing? : A Lot 6 Click Score: 15    End of Session Equipment Utilized During Treatment: Gait belt;Back brace Activity Tolerance: Patient limited by pain Patient left: in bed;with call bell/phone within  reach Nurse Communication: Mobility status PT Visit Diagnosis: Other abnormalities of gait and mobility (R26.89);Muscle weakness (generalized) (M62.81);Pain Pain - part of body: (back and L hip )    Time: [id number] PT Time Calculation (min) (ACUTE ONLY): 20 min   Charges:   PT Evaluation $PT Eval Low Complexity: 1 Low          Lou Miner, DPT  Acute Rehabilitation Services  Pager: [phone] Office: [phone]   Rudean Hitt 01/28/2020, 5:50 PM

## 2020-01-28 NOTE — Progress Notes (Signed)
Orthopedic Tech Progress Note Patient Details:  Beverly Jimenez 19-Dec-1924 [id number] Called in order to [facility] for an [place] Patient ID: Beverly Jimenez, female   DOB: [birth year], 86 y.o.   MRN: [id number]   [name] 01/28/2020, 1:17 PM

## 2020-01-28 NOTE — NC FL2 (Signed)
[place] MEDICAID FL2 LEVEL OF CARE SCREENING TOOL     IDENTIFICATION  Patient Name: Beverly Jimenez Birthdate: [birth year] Sex: female Admission Date (Current Location): 01/28/2020  [facility] and [place] Number:  Herbalist and Address:  [facility], [place] [address], [place], [place] [address]      Provider Number: [id number]  Attending Physician Name and Address:  Earnie Larsson, MD  Relative Name and Phone Number:  Jeneen Rinks, son, [phone]    Current Level of Care: Hospital Recommended Level of Care: [place] Prior Approval Number:    Date Approved/Denied:   PASRR Number: [id number]  Discharge Plan: SNF    Current Diagnoses: Patient Active Problem List   Diagnosis Date Noted  . Degenerative spondylolisthesis 01/28/2020  . AKI (acute kidney injury) ([place]) 04/04/2018  . Pseudomembranous colitis 04/04/2018  . Hypokalemia 04/04/2018  . Hypotension 04/04/2018  . Leucocytosis 04/04/2018  . Acute pain of left knee 10/25/2017  . Hemorrhage of rectum and anus 09/18/2017  . Chronic left-sided low back pain with left-sided sciatica 03/27/2017  . Influenza 01/17/2017  . Influenza A 01/16/2017  . HTN (hypertension) 01/16/2017  . Microcytic anemia 01/16/2017  . Acute hyponatremia 01/16/2017  . Asthma 01/16/2017  . UTI (urinary tract infection) 01/16/2017  . Anemia   . Respiratory distress   . Neck pain 01/11/2017  . Acute pain of left shoulder 01/11/2017  . Left leg pain 01/06/2015  . Varicose veins of both lower extremities with complications [id number]  . HNP (herniated nucleus pulposus), lumbar 07/05/2013    Orientation RESPIRATION BLADDER Height & Weight     Time, Situation, Place, Self  Normal Continent Weight: 175 lb (79.4 kg) Height:  5\' 4"  (162.6 cm)  BEHAVIORAL SYMPTOMS/MOOD NEUROLOGICAL BOWEL NUTRITION STATUS      Continent Diet(Please see DC Summary)  AMBULATORY STATUS COMMUNICATION OF NEEDS Skin   Limited  Assist Verbally Surgical wounds(Closed incision on back)                       Personal Care Assistance Level of Assistance  Bathing, Dressing, Feeding Bathing Assistance: Maximum assistance Feeding assistance: Independent Dressing Assistance: Limited assistance     Functional Limitations Info  Sight, Hearing, Speech Sight Info: Adequate Hearing Info: Adequate Speech Info: Adequate    SPECIAL CARE FACTORS FREQUENCY  PT (By licensed PT), OT (By licensed OT)     PT Frequency: 5x OT Frequency: 5x            Contractures Contractures Info: Not present    Additional Factors Info  Code Status, Allergies, Psychotropic Code Status Info: Full Allergies Info: Amoxicillin, Macrodantin (Nitrofurantoin Macrocrystal), Penicillins, Sulfa Antibiotics, Cymbalta (Duloxetine Hcl) Psychotropic Info: Xanax         Current Medications (01/28/2020):  This is the current hospital active medication list Current Facility-Administered Medications  Medication Dose Route Frequency Provider Last Rate Last Admin  . 0.9 %  sodium chloride infusion  250 mL Intravenous Continuous Earnie Larsson, MD      . acetaminophen (OFIRMEV) 10 MG/ML IV           . acetaminophen (TYLENOL) tablet 650 mg  650 mg Oral Q4H PRN Earnie Larsson, MD       Or  . acetaminophen (TYLENOL) suppository 650 mg  650 mg Rectal Q4H PRN Earnie Larsson, MD      . albuterol (PROVENTIL) (2.5 MG/3ML) 0.083% nebulizer solution 3 mL  3 mL Inhalation Q4H PRN Earnie Larsson,  MD      . ALPRAZolam Duanne Moron) tablet 0.5 mg  0.5 mg Oral QHS Earnie Larsson, MD      . Derrill Memo ON 01/29/2020] ascorbic acid (VITAMIN C) tablet 500 mg  500 mg Oral Daily Pool, Mallie Mussel, MD      . bisacodyl (DULCOLAX) suppository 10 mg  10 mg Rectal Daily PRN Earnie Larsson, MD      . Derrill Memo ON 01/29/2020] cholecalciferol (VITAMIN D3) tablet 1,000 Units  1,000 Units Oral Daily Pool, Mallie Mussel, MD      . diazepam (VALIUM) tablet 5-10 mg  5-10 mg Oral Q6H PRN Earnie Larsson, MD      . Derrill Memo ON  01/29/2020] docusate sodium (COLACE) capsule 100 mg  100 mg Oral Daily Pool, Mallie Mussel, MD      . dorzolamide (TRUSOPT) 2 % ophthalmic solution 1 drop  1 drop Both Eyes BID Pool, Mallie Mussel, MD      . fentaNYL (SUBLIMAZE) 100 MCG/2ML injection           . [START ON 01/29/2020] fluticasone (FLONASE) 50 MCG/ACT nasal spray 1 spray  1 spray Each Nare QHS Pool, Mallie Mussel, MD      . guaiFENesin (MUCINEX) 12 hr tablet 600 mg  600 mg Oral Daily PRN Earnie Larsson, MD      . HYDROcodone-acetaminophen (NORCO) 10-325 MG per tablet 1 tablet  1 tablet Oral Q4H PRN Earnie Larsson, MD   1 tablet at 01/28/20 1408  . HYDROmorphone (DILAUDID) injection 1 mg  1 mg Intravenous Q2H PRN Earnie Larsson, MD      . irbesartan (AVAPRO) tablet 150 mg  150 mg Oral Daily Earnie Larsson, MD      . Derrill Memo ON 01/29/2020] iron polysaccharides (NIFEREX) capsule 150 mg  150 mg Oral q morning - 10a Pool, Mallie Mussel, MD      . latanoprost (XALATAN) 0.005 % ophthalmic solution 1 drop  1 drop Both Eyes QHS Pool, Mallie Mussel, MD      . loratadine (CLARITIN) tablet 10 mg  10 mg Oral Daily PRN Earnie Larsson, MD      . menthol-cetylpyridinium (CEPACOL) lozenge 3 mg  1 lozenge Oral PRN Earnie Larsson, MD       Or  . phenol (CHLORASEPTIC) mouth spray 1 spray  1 spray Mouth/Throat PRN Earnie Larsson, MD      . mometasone-formoterol (DULERA) 100-5 MCG/ACT inhaler 2 puff  2 puff Inhalation BID Earnie Larsson, MD      . Derrill Memo ON 01/29/2020] niacin tablet 500 mg  500 mg Oral Q breakfast Earnie Larsson, MD      . ondansetron [facility]) tablet 4 mg  4 mg Oral Q6H PRN Earnie Larsson, MD       Or  . ondansetron (ZOFRAN) injection 4 mg  4 mg Intravenous Q6H PRN Earnie Larsson, MD      . oxyCODONE (Oxy IR/ROXICODONE) immediate release tablet 5-10 mg  5-10 mg Oral Q3H PRN Earnie Larsson, MD      . Derrill Memo ON 01/29/2020] pantoprazole (PROTONIX) EC tablet 80 mg  80 mg Oral Q1200 Pool, Mallie Mussel, MD      . polyethylene glycol (MIRALAX / GLYCOLAX) packet 17 g  17 g Oral Daily PRN Earnie Larsson, MD      . sodium chloride  (OCEAN) 0.65 % nasal spray 1 spray  1 spray Each Nare PRN Earnie Larsson, MD      . sodium chloride flush (NS) 0.9 % injection 3 mL  3 mL Intravenous Q12H Earnie Larsson, MD      .  sodium chloride flush (NS) 0.9 % injection 3 mL  3 mL Intravenous PRN Earnie Larsson, MD      . sodium phosphate (FLEET) 7-19 GM/118ML enema 1 enema  1 enema Rectal Once PRN Earnie Larsson, MD      . vancomycin (VANCOCIN) IVPB 1000 mg/200 mL premix  1,000 mg Intravenous Once Earnie Larsson, MD      . Derrill Memo ON 01/29/2020] vitamin B-12 (CYANOCOBALAMIN) tablet 500 mcg  500 mcg Oral Daily Earnie Larsson, MD         Discharge Medications: Please see discharge summary for a list of discharge medications.  Relevant Imaging Results:  Relevant Lab Results:   Additional Information [id number]             COVID negative on 01/25/20  Benard Halsted, LCSW

## 2020-01-28 NOTE — Op Note (Signed)
Date of procedure: 01/28/2020  Date of dictation: Same  Service: Neurosurgery  Preoperative diagnosis: L2-3 degenerative retrolisthesis with severe stenosis and radiculopathy  Postoperative diagnosis: Same  Procedure Name: Bilateral L2-3 decompressive laminotomies and foraminotomies, more than would be required for simple interbody fusion alone.  L2-3 posterior lumbar interbody fusion utilizing interbody cages and locally harvested autograft  L2-3 posterior lateral arthrodesis utilizing nonsegmental pedicle screw fixation and local autograft  Reexploration of L3-L5 posterior lateral fusion with removal of hardware.  Surgeon:Brean Carberry A.Jamill Wetmore, M.D.  Asst. Surgeon: Leafy Kindle, NP  Anesthesia: General  Indication: 86 year old female status post prior L3-L5 decompression and fusion more than 10 years ago with good results presents with severe back and bilateral lower extremity pain failing conservative management.  Work-up demonstrates evidence of marked adjacent level disc degeneration with degenerative retrolisthesis and severe stenosis at L23.  Patient has failed conservative management.  She presents now for decompression and fusion in hopes of improving her symptoms.  Operative note: After induction of anesthesia, patient position prone onto Wilson frame and properly padded.  Lumbar region prepped and draped sterilely.  Incision made from L2-L5.  Dissection performed bilaterally.  Retractor placed.  Fluoroscopy used.  Levels confirmed.  Previously placed pedicle screw instrumentation was disassembled.  The fusions from L3-L5 were inspected and found to be quite solid.  Pedicle screws at L4 and L5 were removed bilaterally.  Pedicle screws at L3 were left in place.  Attention was then placed to the L2-3 level.  Decompressive laminotomies were then performed bilaterally using Kerrison rongeurs Leksell rongeurs and high-speed drill to remove the inferior two thirds of the lamina of L2 the  entire inferior facet and pars interarticularis of L2 and the majority the superior facet of L3.  Ligament flavum elevated and resected.  Epidural venous plexus coagulated and cut.  Bilateral discectomies then performed.  Dissipates then distracted.  Disc space was then prepared for interbody fusion.  Soft tissue removed interspace.  Endplates were prepared.  With a distractor placed the patient's right side and 8 mm [employer] expandable cage packed with locally harvested autograft was impacted into place and expanded to its full extent.  Distractor removed patient's right side.  The space then prepared on the right side.  Morselized autograft was packed in the interspace.  A second cage packed with autograft was then impacted into place and expanded to its full extent.  Pedicles at L2 were then identified using surface landmarks and intraoperative fluoroscopy superficial bone around the pedicles and removed using high-speed drill.  Pedicle was then probed using pedicle all each pedicle all track was then probed and found to be solidly within the bone.  Each pedicle all track was then tapped with a screw tap.  Each screw temple was probed and found to be solidly within the bone.  5.75 mm radius brand screws from [employer] were placed bilaterally at L2.  Final images reveal good position of the cages and the hardware at the proper upper level with normal alignment of the spine.  There was a small dural laceration along the left L3 nerve root takeoff.  This was oversewn using Prolene with good watertight closure.  Short segment titanium rods and placed through the screw heads at L2 and L3.  Locking caps placed over the screw.  Locking caps then engaged.  Transverse processes were decorticated and morselized autograft was packed posterior laterally for later fusion.  Gelfoam was placed over the laminotomy defects.  Dural sealant was  placed over the dural repair.  Wound is then closed in layers with Vicryl  sutures.  Vancomycin powder was placed in the deep wound space.  Steri-Strips and sterile dressing were applied.  No apparent complications.  Patient tolerated the procedure well and she returns to the recovery room postop.

## 2020-01-29 LAB — CBC
HCT: 24.7 % — ABNORMAL LOW (ref 36.0–46.0)
Hemoglobin: 8.1 g/dL — ABNORMAL LOW (ref 12.0–15.0)
MCH: 27.1 pg (ref 26.0–34.0)
MCHC: 32.8 g/dL (ref 30.0–36.0)
MCV: 82.6 fL (ref 80.0–100.0)
Platelets: 197 10*3/uL (ref 150–400)
RBC: 2.99 MIL/uL — ABNORMAL LOW (ref 3.87–5.11)
RDW: 13.7 % (ref 11.5–15.5)
WBC: 14.4 10*3/uL — ABNORMAL HIGH (ref 4.0–10.5)
nRBC: 0 % (ref 0.0–0.2)

## 2020-01-29 LAB — SARS CORONAVIRUS 2 (TAT 6-24 HRS): SARS Coronavirus 2: NEGATIVE

## 2020-01-29 MED ORDER — ALUM & MAG HYDROXIDE-SIMETH 200-200-20 MG/5ML PO SUSP
30.0000 mL | Freq: Four times a day (QID) | ORAL | Status: DC | PRN
  Administered 2020-01-29: 30 mL via ORAL
  Filled 2020-01-29: qty 30

## 2020-01-29 MED FILL — Thrombin For Soln 20000 Unit: CUTANEOUS | Qty: 1 | Status: AC

## 2020-01-29 NOTE — TOC Progression Note (Signed)
Transition of Care [facility]) - Progression Note    Patient Details  Name: Beverly Jimenez MRN: [id number] Date of Birth: 1925/09/21  Transition of Care [facility]) CM/SW [place], LCSW Phone Number: 01/29/2020, 10:43 AM  Clinical Narrative:    CSW presented bed offers to patient and patient's son at bedside. They have selected [facility] as patient has been there before and liked their care. [facility] has a bed available for patient when medically stable. COVID test requested today.    Expected Discharge Plan: [place] Barriers to Discharge: Continued Medical Work up  Expected Discharge Plan and Services Expected Discharge Plan: [place] In-house Referral: Clinical Social Work   Post Acute Care Choice: [place] Living arrangements for the past 2 months: Single Family Home                                       Social Determinants of Health (SDOH) Interventions    Readmission Risk Interventions No flowsheet data found.

## 2020-01-29 NOTE — Evaluation (Signed)
Occupational Therapy Evaluation Patient Details Name: Beverly Jimenez MRN: [id number] DOB: 11/16/1925 Today's Date: 01/29/2020    History of Present Illness Pt is a 86 y/o female s/p L2-3 PLIF. PMH includes asthma, glaucoma, HTN, osteoporosis, and back surgery.    Clinical Impression   Pt PTA: pt living alone in home and reports independence prior. Pt currently very limited by b/l hip pain. Pt in severe pain and unable to assist with mobility OOB; modA for bed mobility and use of rail. Pt tolerating sitting EOB x10 mins prior to attempt in standing. Pt maxA to [place] for all ADL a this time, other than feeding. Pt asking for her son to nring her "leg/butt cream that really helps me." Back handout provided and reviewed ADL in detail. Pt educated on: clothing between brace, never sleep in brace, set an alarm at night for medication, avoid sitting for long periods of time, correct bed positioning for sleeping, correct sequence for bed mobility, avoiding lifting more than 5 pounds and never wash directly over incision. All education is complete and patient indicates understanding. Pt unable to recall precautions, but pt very HOH. Pt would greatly benefit from continued OT skilled services for ADL, mobility and safety. OT following.       Follow Up Recommendations  SNF;Supervision/Assistance - 24 hour    Equipment Recommendations  Other (comment)(to be determined)    Recommendations for Other Services       Precautions / Restrictions Precautions Precautions: Back Precaution Booklet Issued: Yes (comment) Precaution Comments: Reviewed back precautions with pt.  Required Braces or Orthoses: Spinal Brace Spinal Brace: Lumbar corset;Applied in sitting position Restrictions Weight Bearing Restrictions: No      Mobility Bed Mobility Overal bed mobility: Needs Assistance Bed Mobility: Rolling;Sidelying to Sit;Sit to Sidelying Rolling: Min assist Sidelying to sit: Min assist     Sit to  sidelying: Min assist General bed mobility comments: Min A to assist with rolling and for trunk elevation to come to sitting. Required assist for LE assist to return to sidelying. Cues for log roll technique   Transfers Overall transfer level: Needs assistance Equipment used: Rolling walker (2 wheeled) Transfers: Sit to/from Stand Sit to Stand: Min assist;Mod assist         General transfer comment: Min to mod A for lift assist. Cues for hand placement.     Balance Overall balance assessment: Needs assistance Sitting-balance support: No upper extremity supported;Feet supported Sitting balance-Leahy Scale: Fair     Standing balance support: Bilateral upper extremity supported;During functional activity Standing balance-Leahy Scale: Poor Standing balance comment: Reliant on UE and external support                           ADL either performed or assessed with clinical judgement   ADL Overall ADL's : Needs assistance/impaired Eating/Feeding: Modified independent;Sitting   Grooming: Minimal assistance;Sitting   Upper Body Bathing: Minimal assistance;Sitting;Bed level   Lower Body Bathing: Maximal assistance;+2 for physical assistance;+2 for safety/equipment;Sitting/lateral leans;Bed level   Upper Body Dressing : Minimal assistance;Sitting;Bed level   Lower Body Dressing: Maximal assistance;+2 for physical assistance;+2 for safety/equipment;Sitting/lateral leans;Bed level     Toilet Transfer Details (indicate cue type and reason): unable to sit to stand Toileting- Clothing Manipulation and Hygiene: Maximal assistance;Total assistance;+2 for physical assistance       Functional mobility during ADLs: Maximal assistance;+2 for physical assistance;+2 for safety/equipment;Rolling walker General ADL Comments: Pt very limited by b/l hip pain. Pt  in severe pain and unable to assist with mobility OOB; modA for bed mobility and use of rail. Pt tolerating sitting EOB x10  mins prior to attempt in standing.     Vision Baseline Vision/History: No visual deficits Vision Assessment?: No apparent visual deficits     Perception     Praxis      Pertinent Vitals/Pain Faces Pain Scale: Hurts whole lot Pain Location: back Pain Descriptors / Indicators: Aching;Operative site guarding     Hand Dominance Right   Extremity/Trunk Assessment Upper Extremity Assessment Upper Extremity Assessment: Generalized weakness   Lower Extremity Assessment Lower Extremity Assessment: Generalized weakness LLE Deficits / Details: B/L hip pain at baseline; reports it is shooting pain    Cervical / Trunk Assessment Cervical / Trunk Assessment: Other exceptions Cervical / Trunk Exceptions: s/p PLIF    Communication Communication Communication: No difficulties   Cognition Arousal/Alertness: Awake/alert Behavior During Therapy: WFL for tasks assessed/performed Overall Cognitive Status: Within Functional Limits for tasks assessed                                     General Comments       Exercises     Shoulder Instructions      Home Living Family/patient expects to be discharged to:: Skilled nursing facility Living Arrangements: Alone                               Additional Comments: Son lives out of state, but is here for visiting hours      Prior Functioning/Environment Level of Independence: Independent with assistive device(s)        Comments: Was using RW for mobility         OT Problem List: Decreased strength;Impaired balance (sitting and/or standing);Decreased activity tolerance;Decreased safety awareness;Pain;Decreased knowledge of use of DME or AE      OT Treatment/Interventions: Self-care/ADL training;Therapeutic exercise;Neuromuscular education;Energy conservation;DME and/or AE instruction;Therapeutic activities;Patient/family education;Balance training    OT Goals(Current goals can be found in the care plan  section) Acute Rehab OT Goals Patient Stated Goal: to be able to walk better OT Goal Formulation: With patient Time For Goal Achievement: 02/12/20 Potential to Achieve Goals: Good ADL Goals Pt Will Perform Grooming: with min assist;sitting;standing Pt Will Perform Lower Body Dressing: with mod assist;with adaptive equipment;sitting/lateral leans;sit to/from stand Pt Will Transfer to Toilet: with min assist;stand pivot transfer;bedside commode Pt/caregiver will Perform Home Exercise Program: Increased strength;Both right and left upper extremity;With written HEP provided Additional ADL Goal #1: Pt will perform sit to stands with minA overall in prep for ADL.  OT Frequency: Min 3X/week   Barriers to D/C:            Co-evaluation              AM-PAC OT "6 Clicks" Daily Activity     Outcome Measure Help from another person eating meals?: None Help from another person taking care of personal grooming?: A Lot Help from another person toileting, which includes using toliet, bedpan, or urinal?: Total Help from another person bathing (including washing, rinsing, drying)?: Total Help from another person to put on and taking off regular upper body clothing?: A Lot Help from another person to put on and taking off regular lower body clothing?: Total 6 Click Score: 11   End of Session Equipment Utilized During Treatment: Gait belt;Rolling  walker;Back brace Nurse Communication: Mobility status  Activity Tolerance: Patient limited by pain Patient left: in bed;with call bell/phone within reach  OT Visit Diagnosis: Unsteadiness on feet (R26.81);Muscle weakness (generalized) (M62.81);Pain Pain - part of body: Leg(b/l)                Time: [id number] OT Time Calculation (min): 33 min Charges:  OT General Charges $OT Visit: 1 Visit OT Evaluation $OT Eval Moderate Complexity: 1 Mod OT Treatments $Self Care/Home Management : 8-22 mins  Jefferey Pica, OTR/L Acute Rehabilitation  Services Pager: [phone] Office: [phone]   Kaoru Benda C 01/29/2020, 8:44 AM

## 2020-01-29 NOTE — Anesthesia Postprocedure Evaluation (Signed)
Anesthesia Post Note  Patient: Beverly Jimenez  Procedure(s) Performed: POSTERIOR LUMBAR INTERBODY FUSION - LUMBAR TWO-LUMBAR THREE (N/A Back)     Patient location during evaluation: PACU Anesthesia Type: General Level of consciousness: awake and alert Pain management: pain level controlled Vital Signs Assessment: post-procedure vital signs reviewed and stable Respiratory status: spontaneous breathing, nonlabored ventilation, respiratory function stable and patient connected to nasal cannula oxygen Cardiovascular status: blood pressure returned to baseline and stable Postop Assessment: no apparent nausea or vomiting Anesthetic complications: no    Last Vitals:  Vitals:   01/29/20 1554 01/29/20 1930  BP: (!) 139/55 (!) 163/62  Pulse: 88 92  Resp: 18 18  Temp: 37.1 C 37.9 C  SpO2: 100% 100%    Last Pain:  Vitals:   01/29/20 1930  TempSrc: Oral  PainSc:                  Hadja Harral

## 2020-01-29 NOTE — Progress Notes (Signed)
Postop day 1.  Patient complains of buttocks pain.  She is having no lower extremity pain numbness or weakness.  She ambulated yesterday but was too sore to get up today.  She denies any abdominal complaints.  She is voiding well.  She is afebrile.Blood pressure and heart rate are good.  Saturating well.  She is awake and alert.  Motor and sensory exam intact.  Wound clean and dry.  Chest and abdomen benign.  Overall progressing reasonably well.  Check follow-up hematocrit today.  Patient would like to work toward skilled nursing facility placement for postoperative care.

## 2020-01-29 NOTE — Progress Notes (Signed)
Physical Therapy Treatment Patient Details Name: Beverly Jimenez MRN: [id number] DOB: Nov 06, 1925 Today's Date: 01/29/2020    History of Present Illness Pt is a 86 y/o female s/p L2-3 PLIF. PMH includes asthma, glaucoma, HTN, osteoporosis, and back surgery.     PT Comments    Pt progressing slowly with post-op mobility. She was able to demonstrate transfers and ambulation with up to heavy +2 mod assist and RW for support. Session was limited to transfer back to bed from [facility] due to increased level of assist required. Pt was educated on precautions throughout functional mobility but will need further review. SNF level rehab remains appropriate at d/c. Will continue to follow.      Follow Up Recommendations  SNF;Supervision/Assistance - 24 hour     Equipment Recommendations  None recommended by PT    Recommendations for Other Services       Precautions / Restrictions Precautions Precautions: Back;Fall Precaution Booklet Issued: Yes (comment) Precaution Comments: Reviewed back precautions with pt.  Required Braces or Orthoses: Spinal Brace Spinal Brace: Lumbar corset;Applied in sitting position Restrictions Weight Bearing Restrictions: No    Mobility  Bed Mobility Overal bed mobility: Needs Assistance Bed Mobility: Rolling;Sit to Sidelying Rolling: Min assist       Sit to sidelying: Min assist;+2 for physical assistance General bed mobility comments: +2 for LE elevation up onto bed at end of session, and for log roll technique.   Transfers Overall transfer level: Needs assistance Equipment used: Rolling walker (2 wheeled) Transfers: Sit to/from Stand Sit to Stand: Mod assist;+2 physical assistance         General transfer comment: Heavy +2 mod assist to power-up to full standing position from [facility]. Increased time required for pt to transition hands from [facility] to RW for support.   Ambulation/Gait Ambulation/Gait assistance: Mod assist;+2 physical assistance Gait  Distance (Feet): 2 Feet Assistive device: Rolling walker (2 wheeled) Gait Pattern/deviations: Step-through pattern;Decreased stride length Gait velocity: Decreased Gait velocity interpretation: <1.31 ft/sec, indicative of household ambulator General Gait Details: Pt was able to take a couple steps from [facility] to EOB with RW and heavy +2 support.    Stairs             Wheelchair Mobility    Modified Rankin (Stroke Patients Only)       Balance Overall balance assessment: Needs assistance Sitting-balance support: No upper extremity supported;Feet supported Sitting balance-Leahy Scale: Fair     Standing balance support: Bilateral upper extremity supported;During functional activity Standing balance-Leahy Scale: Poor Standing balance comment: Reliant on UE and external support                            Cognition Arousal/Alertness: Awake/alert Behavior During Therapy: WFL for tasks assessed/performed Overall Cognitive Status: Within Functional Limits for tasks assessed                                        Exercises      General Comments        Pertinent Vitals/Pain Pain Assessment: Faces Faces Pain Scale: Hurts whole lot Pain Location: back, hips, thighs, buttocks Pain Descriptors / Indicators: Aching;Operative site guarding Pain Intervention(s): Limited activity within patient's tolerance;Monitored during session;Repositioned    Home Living  Prior Function            PT Goals (current goals can now be found in the care plan section) Acute Rehab PT Goals Patient Stated Goal: "Do better tomorrow" PT Goal Formulation: With patient Time For Goal Achievement: 02/11/20 Potential to Achieve Goals: Good Progress towards PT goals: Not progressing toward goals - comment(More assist required this date)    Frequency    Min 5X/week      PT Plan Current plan remains appropriate    Co-evaluation               AM-PAC PT "6 Clicks" Mobility   Outcome Measure  Help needed turning from your back to your side while in a flat bed without using bedrails?: A Little Help needed moving from lying on your back to sitting on the side of a flat bed without using bedrails?: A Lot Help needed moving to and from a bed to a chair (including a wheelchair)?: A Lot Help needed standing up from a chair using your arms (e.g., wheelchair or bedside chair)?: A Lot Help needed to walk in hospital room?: A Lot Help needed climbing 3-5 steps with a railing? : Total 6 Click Score: 12    End of Session Equipment Utilized During Treatment: Gait belt;Back brace Activity Tolerance: Patient limited by pain Patient left: in bed;with call bell/phone within reach Nurse Communication: Mobility status PT Visit Diagnosis: Other abnormalities of gait and mobility (R26.89);Muscle weakness (generalized) (M62.81);Pain Pain - part of body: (back and L hip )     Time: [id number] PT Time Calculation (min) (ACUTE ONLY): 22 min  Charges:  $Gait Training: 8-22 mins                     Rolinda Roan, PT, DPT Acute Rehabilitation Services Pager: [phone] Office: [phone]    Thelma Comp 01/29/2020, 2:41 PM

## 2020-01-30 DIAGNOSIS — I491 Atrial premature depolarization: Secondary | ICD-10-CM | POA: Diagnosis not present

## 2020-01-30 DIAGNOSIS — F419 Anxiety disorder, unspecified: Secondary | ICD-10-CM | POA: Diagnosis present

## 2020-01-30 DIAGNOSIS — G0490 Encephalitis and encephalomyelitis, unspecified: Secondary | ICD-10-CM | POA: Diagnosis present

## 2020-01-30 DIAGNOSIS — M2550 Pain in unspecified joint: Secondary | ICD-10-CM | POA: Diagnosis not present

## 2020-01-30 DIAGNOSIS — G92 Toxic encephalopathy: Secondary | ICD-10-CM | POA: Diagnosis present

## 2020-01-30 DIAGNOSIS — R6521 Severe sepsis with septic shock: Secondary | ICD-10-CM | POA: Diagnosis present

## 2020-01-30 DIAGNOSIS — G935 Compression of brain: Secondary | ICD-10-CM | POA: Diagnosis present

## 2020-01-30 DIAGNOSIS — Z7401 Bed confinement status: Secondary | ICD-10-CM | POA: Diagnosis not present

## 2020-01-30 DIAGNOSIS — J690 Pneumonitis due to inhalation of food and vomit: Secondary | ICD-10-CM | POA: Diagnosis present

## 2020-01-30 DIAGNOSIS — I959 Hypotension, unspecified: Secondary | ICD-10-CM | POA: Diagnosis not present

## 2020-01-30 DIAGNOSIS — A419 Sepsis, unspecified organism: Secondary | ICD-10-CM | POA: Diagnosis present

## 2020-01-30 DIAGNOSIS — E860 Dehydration: Secondary | ICD-10-CM | POA: Diagnosis present

## 2020-01-30 DIAGNOSIS — G9341 Metabolic encephalopathy: Secondary | ICD-10-CM | POA: Diagnosis not present

## 2020-01-30 DIAGNOSIS — R609 Edema, unspecified: Secondary | ICD-10-CM | POA: Diagnosis not present

## 2020-01-30 DIAGNOSIS — M4316 Spondylolisthesis, lumbar region: Secondary | ICD-10-CM | POA: Diagnosis not present

## 2020-01-30 DIAGNOSIS — G911 Obstructive hydrocephalus: Secondary | ICD-10-CM | POA: Diagnosis present

## 2020-01-30 DIAGNOSIS — M6281 Muscle weakness (generalized): Secondary | ICD-10-CM | POA: Diagnosis not present

## 2020-01-30 DIAGNOSIS — M4326 Fusion of spine, lumbar region: Secondary | ICD-10-CM | POA: Diagnosis not present

## 2020-01-30 DIAGNOSIS — Z4682 Encounter for fitting and adjustment of non-vascular catheter: Secondary | ICD-10-CM | POA: Diagnosis not present

## 2020-01-30 DIAGNOSIS — J9811 Atelectasis: Secondary | ICD-10-CM | POA: Diagnosis not present

## 2020-01-30 DIAGNOSIS — I10 Essential (primary) hypertension: Secondary | ICD-10-CM | POA: Diagnosis present

## 2020-01-30 DIAGNOSIS — R404 Transient alteration of awareness: Secondary | ICD-10-CM | POA: Diagnosis not present

## 2020-01-30 DIAGNOSIS — R001 Bradycardia, unspecified: Secondary | ICD-10-CM | POA: Diagnosis not present

## 2020-01-30 DIAGNOSIS — J45901 Unspecified asthma with (acute) exacerbation: Secondary | ICD-10-CM | POA: Diagnosis not present

## 2020-01-30 DIAGNOSIS — R488 Other symbolic dysfunctions: Secondary | ICD-10-CM | POA: Diagnosis not present

## 2020-01-30 DIAGNOSIS — E871 Hypo-osmolality and hyponatremia: Secondary | ICD-10-CM | POA: Diagnosis present

## 2020-01-30 DIAGNOSIS — G039 Meningitis, unspecified: Secondary | ICD-10-CM | POA: Diagnosis not present

## 2020-01-30 DIAGNOSIS — R5381 Other malaise: Secondary | ICD-10-CM | POA: Diagnosis not present

## 2020-01-30 DIAGNOSIS — Z515 Encounter for palliative care: Secondary | ICD-10-CM | POA: Diagnosis not present

## 2020-01-30 DIAGNOSIS — E785 Hyperlipidemia, unspecified: Secondary | ICD-10-CM | POA: Diagnosis present

## 2020-01-30 DIAGNOSIS — F329 Major depressive disorder, single episode, unspecified: Secondary | ICD-10-CM | POA: Diagnosis present

## 2020-01-30 DIAGNOSIS — Z66 Do not resuscitate: Secondary | ICD-10-CM | POA: Diagnosis not present

## 2020-01-30 DIAGNOSIS — R4020 Unspecified coma: Secondary | ICD-10-CM | POA: Diagnosis not present

## 2020-01-30 DIAGNOSIS — R52 Pain, unspecified: Secondary | ICD-10-CM | POA: Diagnosis not present

## 2020-01-30 DIAGNOSIS — M1990 Unspecified osteoarthritis, unspecified site: Secondary | ICD-10-CM | POA: Diagnosis present

## 2020-01-30 DIAGNOSIS — R4701 Aphasia: Secondary | ICD-10-CM | POA: Diagnosis present

## 2020-01-30 DIAGNOSIS — Z03818 Encounter for observation for suspected exposure to other biological agents ruled out: Secondary | ICD-10-CM | POA: Diagnosis not present

## 2020-01-30 DIAGNOSIS — J45909 Unspecified asthma, uncomplicated: Secondary | ICD-10-CM | POA: Diagnosis present

## 2020-01-30 DIAGNOSIS — Z20822 Contact with and (suspected) exposure to covid-19: Secondary | ICD-10-CM | POA: Diagnosis present

## 2020-01-30 DIAGNOSIS — J9601 Acute respiratory failure with hypoxia: Secondary | ICD-10-CM | POA: Diagnosis present

## 2020-01-30 DIAGNOSIS — G936 Cerebral edema: Secondary | ICD-10-CM | POA: Diagnosis present

## 2020-01-30 DIAGNOSIS — Z4789 Encounter for other orthopedic aftercare: Secondary | ICD-10-CM | POA: Diagnosis not present

## 2020-01-30 DIAGNOSIS — Z981 Arthrodesis status: Secondary | ICD-10-CM | POA: Diagnosis not present

## 2020-01-30 DIAGNOSIS — R4182 Altered mental status, unspecified: Secondary | ICD-10-CM | POA: Diagnosis not present

## 2020-01-30 DIAGNOSIS — E876 Hypokalemia: Secondary | ICD-10-CM | POA: Diagnosis not present

## 2020-01-30 DIAGNOSIS — A0472 Enterocolitis due to Clostridium difficile, not specified as recurrent: Secondary | ICD-10-CM | POA: Diagnosis not present

## 2020-01-30 DIAGNOSIS — K219 Gastro-esophageal reflux disease without esophagitis: Secondary | ICD-10-CM | POA: Diagnosis present

## 2020-01-30 DIAGNOSIS — R29898 Other symptoms and signs involving the musculoskeletal system: Secondary | ICD-10-CM | POA: Diagnosis not present

## 2020-01-30 MED ORDER — DIAZEPAM 5 MG PO TABS: 5.0000 mg | ORAL_TABLET | Freq: Four times a day (QID) | ORAL | 0 refills | Status: AC | PRN

## 2020-01-30 MED ORDER — OXYCODONE HCL 5 MG PO TABS: 5.0000 mg | ORAL_TABLET | ORAL | 0 refills | Status: AC | PRN

## 2020-01-30 MED ORDER — ALPRAZOLAM 0.5 MG PO TABS: 0.5000 mg | ORAL_TABLET | Freq: Every day | ORAL | 0 refills | Status: AC

## 2020-01-30 NOTE — Progress Notes (Signed)
Occupational Therapy Treatment Patient Details Name: Beverly Jimenez MRN: [id number] DOB: 1925/08/03 Today's Date: 01/30/2020    History of present illness Pt is a 86 y/o female s/p L2-3 PLIF. PMH includes asthma, glaucoma, HTN, osteoporosis, and back surgery.    OT comments  Pt seen in conjunction with PT.  She demonstrates improved pain and improved functional mobility, ADLs and safety.  She requires min - max A for ADLs and is very eager to progress.  Recommend SNF.   Follow Up Recommendations  SNF;Supervision/Assistance - 24 hour    Equipment Recommendations  None recommended by OT    Recommendations for Other Services      Precautions / Restrictions Precautions Precautions: Back;Fall Precaution Booklet Issued: Yes (comment) Precaution Comments: Pt was cued for maintenance of precautions during functional mobility.  Required Braces or Orthoses: Spinal Brace Spinal Brace: Lumbar corset;Applied in sitting position Restrictions Weight Bearing Restrictions: No       Mobility Bed Mobility Overal bed mobility: Needs Assistance Bed Mobility: Rolling;Sidelying to Sit Rolling: Min assist Sidelying to sit: Min assist;+2 for physical assistance       General bed mobility comments: Bed pad utilized for rolling and +2 min assist for trunk elevation to full sitting position, with therapist providing posterior support initially until pt scooted out fully to EOB and gained sitting balance.   Transfers Overall transfer level: Needs assistance Equipment used: Rolling walker (2 wheeled) Transfers: Sit to/from [facility] Sit to Stand: Mod assist;+2 safety/equipment Stand pivot transfers: Min assist;+2 safety/equipment       General transfer comment: Heavier assist initially to stand from EOB, however pt was improved from toilet as she was pulling on safety bar in bathroom to stand.     Balance Overall balance assessment: Needs assistance Sitting-balance support:  No upper extremity supported;Feet supported Sitting balance-Leahy Scale: Fair     Standing balance support: Bilateral upper extremity supported;During functional activity Standing balance-Leahy Scale: Poor Standing balance comment: Reliant on UE support. Attempted to wipe herself after toileting however with several mild losses of balance.                            ADL either performed or assessed with clinical judgement   ADL Overall ADL's : Needs assistance/impaired                         Toilet Transfer: Moderate assistance;Ambulation;Comfort height toilet;Grab bars;RW [employer] forces technical officer Details (indicate cue type and reason): assist to control descent and assist to rise  Toileting- Clothing Manipulation and Hygiene: Sit to/from stand;Moderate assistance Toileting - Clothing Manipulation Details (indicate cue type and reason): unable to access peri area without bending      Functional mobility during ADLs: Minimal assistance;Moderate assistance;Rolling walker       Vision       Perception     Praxis      Cognition Arousal/Alertness: Awake/alert Behavior During Therapy: WFL for tasks assessed/performed Overall Cognitive Status: Within Functional Limits for tasks assessed                                          Exercises     Shoulder Instructions       General Comments reinforced back precautions     Pertinent Vitals/ Pain       Pain  Assessment: Faces Faces Pain Scale: Hurts even more Pain Location: Mostly hips and buttocks with initial stand. At rest in bed, pt reports no pain.  Pain Descriptors / Indicators: Grimacing;Guarding Pain Intervention(s): Limited activity within patient's tolerance;Monitored during session;Repositioned  Home Living                                          Prior Functioning/Environment              Frequency  Min 3X/week        Progress Toward Goals  OT  Goals(current goals can now be found in the care plan section)  Progress towards OT goals: Progressing toward goals  Acute Rehab OT Goals Patient Stated Goal: "Do better tomorrow"  Plan Discharge plan remains appropriate    Co-evaluation    PT/OT/SLP Co-Evaluation/Treatment: Yes Reason for Co-Treatment: For patient/therapist safety;To address functional/ADL transfers PT goals addressed during session: Mobility/safety with mobility;Balance;Proper use of DME OT goals addressed during session: ADL's and self-care      AM-PAC OT "6 Clicks" Daily Activity     Outcome Measure   Help from another person eating meals?: None Help from another person taking care of personal grooming?: A Lot Help from another person toileting, which includes using toliet, bedpan, or urinal?: A Lot Help from another person bathing (including washing, rinsing, drying)?: A Lot Help from another person to put on and taking off regular upper body clothing?: A Lot Help from another person to put on and taking off regular lower body clothing?: A Lot 6 Click Score: 14    End of Session Equipment Utilized During Treatment: Gait belt;Rolling walker;Back brace  OT Visit Diagnosis: Unsteadiness on feet (R26.81);Muscle weakness (generalized) (M62.81);Pain Pain - part of body: Leg   Activity Tolerance Patient tolerated treatment well   Patient Left in chair;with call bell/phone within reach   Nurse Communication Mobility status        Time: [id number] OT Time Calculation (min): 25 min  Charges: OT General Charges $OT Visit: 1 Visit OT Treatments $Self Care/Home Management : 8-22 mins  Nilsa Nutting OTR/L Acute Rehabilitation Services Pager [phone] Office [phone]    Lucille Passy M 01/30/2020, 10:23 AM

## 2020-01-30 NOTE — Discharge Instructions (Signed)

## 2020-01-30 NOTE — Progress Notes (Signed)
Physical Therapy Treatment Patient Details Name: Beverly Jimenez MRN: [id number] DOB: 04-21-1925 Today's Date: 01/30/2020    History of Present Illness Pt is a 86 y/o female s/p L2-3 PLIF. PMH includes asthma, glaucoma, HTN, osteoporosis, and back surgery.     PT Comments    Pt progressing well with post-op mobility. She was able to demonstrate transfers and ambulation with gross min-mod assist and intermittent +2 support. Does well with increased time and multimodal cues as pt is HOH, when giving cues for mobility. Although improving with mobility, feel SNF is still the safest d/c option to maximize functional independence and safety prior to return home. Education was reinforced on precautions throughout functional mobility. Will continue to follow.     Follow Up Recommendations  SNF;Supervision/Assistance - 24 hour     Equipment Recommendations  None recommended by PT    Recommendations for Other Services       Precautions / Restrictions Precautions Precautions: Back;Fall Precaution Booklet Issued: Yes (comment) Precaution Comments: Pt was cued for maintenance of precautions during functional mobility.  Required Braces or Orthoses: Spinal Brace Spinal Brace: Lumbar corset;Applied in sitting position Restrictions Weight Bearing Restrictions: No    Mobility  Bed Mobility Overal bed mobility: Needs Assistance Bed Mobility: Rolling;Sidelying to Sit Rolling: Min assist Sidelying to sit: Min assist;+2 for physical assistance       General bed mobility comments: Bed pad utilized for rolling and +2 min assist for trunk elevation to full sitting position, with therapist providing posterior support initially until pt scooted out fully to EOB and gained sitting balance.   Transfers Overall transfer level: Needs assistance Equipment used: Rolling walker (2 wheeled) Transfers: Sit to/from Stand Sit to Stand: Mod assist;+2 safety/equipment         General transfer comment:  Heavier assist initially to stand from EOB, however pt was improved from toilet as she was pulling on safety bar in bathroom to stand.   Ambulation/Gait Ambulation/Gait assistance: Min assist;+2 safety/equipment Gait Distance (Feet): 100 Feet Assistive device: Rolling walker (2 wheeled) Gait Pattern/deviations: Step-through pattern;Decreased stride length;Trunk flexed;Narrow base of support Gait velocity: Decreased Gait velocity interpretation: <1.31 ft/sec, indicative of household ambulator General Gait Details: Very slow with generally flexed trunk. Pt with minimal floor clearance. Chair follow utilized as pt with increased lateral sway and instability in R knee as pt fatigued. Once ambulating, pt motivated for distance.    Stairs             Wheelchair Mobility    Modified Rankin (Stroke Patients Only)       Balance Overall balance assessment: Needs assistance Sitting-balance support: No upper extremity supported;Feet supported Sitting balance-Leahy Scale: Fair     Standing balance support: Bilateral upper extremity supported;During functional activity Standing balance-Leahy Scale: Poor Standing balance comment: Reliant on UE support. Attempted to wipe herself after toileting however with several mild losses of balance.                             Cognition Arousal/Alertness: Awake/alert Behavior During Therapy: WFL for tasks assessed/performed Overall Cognitive Status: Within Functional Limits for tasks assessed                                        Exercises      General Comments        Pertinent Vitals/Pain Pain  Assessment: Faces Faces Pain Scale: Hurts even more Pain Location: Mostly hips and buttocks with initial stand. At rest in bed, pt reports no pain.  Pain Descriptors / Indicators: Grimacing;Guarding Pain Intervention(s): Limited activity within patient's tolerance;Monitored during session;Repositioned    Home Living                       Prior Function            PT Goals (current goals can now be found in the care plan section) Acute Rehab PT Goals Patient Stated Goal: "Do better tomorrow" PT Goal Formulation: With patient Time For Goal Achievement: 02/11/20 Potential to Achieve Goals: Good Progress towards PT goals: Progressing toward goals    Frequency    Min 5X/week      PT Plan Current plan remains appropriate    Co-evaluation PT/OT/SLP Co-Evaluation/Treatment: Yes Reason for Co-Treatment: For patient/therapist safety;To address functional/ADL transfers PT goals addressed during session: Mobility/safety with mobility;Balance;Proper use of DME        AM-PAC PT "6 Clicks" Mobility   Outcome Measure  Help needed turning from your back to your side while in a flat bed without using bedrails?: A Little Help needed moving from lying on your back to sitting on the side of a flat bed without using bedrails?: A Little Help needed moving to and from a bed to a chair (including a wheelchair)?: A Little Help needed standing up from a chair using your arms (e.g., wheelchair or bedside chair)?: A Little Help needed to walk in hospital room?: A Little Help needed climbing 3-5 steps with a railing? : Total 6 Click Score: 16    End of Session Equipment Utilized During Treatment: Gait belt;Back brace Activity Tolerance: Patient limited by pain Patient left: in bed;with call bell/phone within reach Nurse Communication: Mobility status PT Visit Diagnosis: Other abnormalities of gait and mobility (R26.89);Muscle weakness (generalized) (M62.81);Pain Pain - part of body: Hip(back)     Time: [id number] PT Time Calculation (min) (ACUTE ONLY): 24 min  Charges:  $Gait Training: 8-22 mins                     Beverly Jimenez, PT, DPT Acute Rehabilitation Services Pager: [phone] Office: [phone]    Beverly Jimenez 01/30/2020, 9:45 AM

## 2020-01-30 NOTE — TOC Transition Note (Addendum)
Transition of Care [facility]) - CM/SW Discharge Note   Patient Details  Name: Beverly Jimenez MRN: [id number] Date of Birth: 1925-02-17  [facility] CM/SW Contact:  Benard Halsted, LCSW Phone Number: 01/30/2020, 2:07 PM   Clinical Narrative:    Patient will DC to: [facility] Anticipated DC date: 01/30/20 Family notified: Son at bedside Transport by: Corey Harold  MD to fax controlled scripts to [phone] and [phone].  Per MD patient ready for DC to [facility]. RN, patient, patient's family, and facility notified of DC. Discharge Summary and FL2 sent to facility. RN to call report prior to discharge [phone]). DC packet on chart. Ambulance transport requested for patient.   CSW will sign off for now as social work intervention is no longer needed. Please consult [place] again if new needs arise.  Cedric Fishman, LCSW Clinical Social Worker [phone]    Final next level of care: Skilled Nursing Facility Barriers to Discharge: No Barriers Identified   Patient Goals and CMS Choice Patient states their goals for this hospitalization and ongoing recovery are:: Rehab CMS Medicare.gov Compare Post Acute Care list provided to:: Patient Choice offered to / list presented to : Patient, Adult Children  Discharge Placement   Existing PASRR number confirmed : 01/30/20          Patient chooses bed at: [facility] Patient to be transferred to facility by: [facility] Name of family member notified: Son Patient and family notified of of transfer: 01/30/20  Discharge Plan and Services In-house Referral: Clinical Social Work   Post Acute Care Choice: [place]          DME Arranged: N/A DME Agency: NA       HH Arranged: NA HH Agency: NA        Social Determinants of Health (SDOH) Interventions     Readmission Risk Interventions No flowsheet data found.

## 2020-01-30 NOTE — Progress Notes (Signed)
Patient is discharged from room 3C05 to [facility] SNF at this time. Alert and in stable condition. IV site d/c'd and instructions read to patient and son with all questions answered and understanding verbalized. Report called in to receiving nurse Jory Sims, LPN with all questions answered. Left unit via stretcher by PTAR with son and all belongings at side.

## 2020-01-30 NOTE — Discharge Summary (Signed)
Physician Discharge Summary  Patient ID: Beverly Jimenez MRN: [id number] DOB/AGE: 84-Aug-1926 84 y.o.  Admit date: 01/28/2020 Discharge date: 01/30/2020  Admission Diagnoses: Degenerative spondylolisthesis  Discharge Diagnoses:  Active Problems:   Degenerative spondylolisthesis   Discharged Condition: good  Hospital Course: Patient was admitted to 3C05 following L2-3 PLIF. Her post-operative course was uncomplicated. She has worked with both physical and occupational therapies, who are recommending a skilled nursing facility at discharge. Her pain is well-controlled with oral analgesics. She is ready for discharge to [facility].  Consults: rehabilitation medicine  Significant Diagnostic Studies: radiology: DG Lumbar Spine 2-3 Views  Result Date: 01/28/2020 CLINICAL DATA:  Lumbar surgery. EXAM: LUMBAR SPINE - 2-3 VIEW; DG C-ARM 1-60 MIN COMPARISON:  MRI 12/13/2018. FINDINGS: Lower lumbar posterior and interbody fusion. Visualized hardware intact. Anatomic alignment. Fluoroscopy time 0 minutes 20 seconds. IMPRESSION: Postsurgical changes lumbar spine. Electronically Signed   By: Marcello Moores  Register   On: 01/28/2020 11:25   DG C-Arm 1-60 Min  Result Date: 01/28/2020 CLINICAL DATA:  Lumbar surgery. EXAM: LUMBAR SPINE - 2-3 VIEW; DG C-ARM 1-60 MIN COMPARISON:  MRI 12/13/2018. FINDINGS: Lower lumbar posterior and interbody fusion. Visualized hardware intact. Anatomic alignment. Fluoroscopy time 0 minutes 20 seconds. IMPRESSION: Postsurgical changes lumbar spine. Electronically Signed   By: Marcello Moores  Register   On: 01/28/2020 11:25     Treatments: surgery:    Bilateral L2-3 decompressive laminotomies and foraminotomies, more than would be required for simple interbody fusion alone.  L2-3 posterior lumbar interbody fusion utilizing interbody cages ([employer]) and locally harvested autograft  L2-3 posterior lateral arthrodesis utilizing nonsegmental pedicle screw ([employer]) fixation and  local autograft  Reexploration of L3-L5 posterior lateral fusion with removal of hardware.  Discharge Exam: Blood pressure (!) 154/66, pulse 96, temperature 97.8 F (36.6 C), temperature source Oral, resp. rate 16, height 5\' 4"  (1.626 m), weight 79.4 kg, SpO2 99 %.   Alert and oriented Motor and sensory exam grossly intact Abdomen soft Wound is clean, dry, and intact  Disposition: Discharge disposition: 03-Skilled Nursing Facility        Allergies as of 01/30/2020      Reactions   Amoxicillin Hives, Itching, Rash, Other (See Comments)   Has patient had a PCN reaction causing immediate rash, facial/tongue/throat swelling, SOB or lightheadedness with hypotension: Unknown Has patient had a PCN reaction causing SEVERE rash involving mucus membranes or skin necrosis:    #  #  YES  #  #  Has patient had a PCN reaction that required hospitalization: No Has patient had a PCN reaction occurring within the last 10 years: No    Macrodantin [nitrofurantoin Macrocrystal] Shortness Of Breath   Penicillins Other (See Comments)   SEE AMOXICILLIN   Sulfa Antibiotics Shortness Of Breath, Rash   Cymbalta [duloxetine Hcl]    UNSPECIFIED REACTION       Medication List    STOP taking these medications   HYDROcodone-acetaminophen 5-325 MG tablet Commonly known as: NORCO/VICODIN     TAKE these medications   acetaminophen 500 MG tablet Commonly known as: TYLENOL Take 1-2 tablets (500-1,000 mg total) by mouth every 8 (eight) hours as needed for mild pain, moderate pain, fever or headache.   albuterol 108 (90 Base) MCG/ACT inhaler Commonly known as: VENTOLIN HFA Inhale 2 puffs into the lungs every 4 (four) hours as needed for wheezing or shortness of breath.   ALIGN PO Take 1 capsule by mouth every morning.   ALPRAZolam 0.5 MG tablet Commonly known as: Duanne Moron  Take 1 tablet (0.5 mg total) by mouth at bedtime.   bimatoprost 0.01 % Soln Commonly known as: LUMIGAN Place 1 drop into both  eyes at bedtime.   CALTRATE PLUS PO Take 1 tablet by mouth 2 (two) times daily.   Chloramphenicol Powd Place 1 application into the left ear every Thursday. Chloramphenicol/Sulfamenth/Amphoteracin    SUPERVALU INC Powder   cholecalciferol 25 MCG (1000 UNIT) tablet Commonly known as: VITAMIN D3 Take 1,000 Units by mouth daily.   diazepam 5 MG tablet Commonly known as: VALIUM Take 1-2 tablets (5-10 mg total) by mouth every 6 (six) hours as needed for muscle spasms.   docusate sodium 100 MG capsule Commonly known as: COLACE Take 100 mg by mouth daily.   dorzolamide 2 % ophthalmic solution Commonly known as: TRUSOPT Place 1 drop into both eyes 2 (two) times daily.   esomeprazole 40 MG capsule Commonly known as: NEXIUM Take 40 mg by mouth daily at 12 noon.   fish oil-omega-3 fatty acids 1000 MG capsule Take 1 g by mouth 2 (two) times daily.   fluticasone 50 MCG/ACT nasal spray Commonly known as: FLONASE Place 1 spray into both nostrils at bedtime.   Fluticasone-Salmeterol 100-50 MCG/DOSE Aepb Commonly known as: ADVAIR Inhale 1 puff into the lungs 2 (two) times daily.   guaiFENesin 600 MG 12 hr tablet Commonly known as: MUCINEX Take 600 mg by mouth daily as needed for cough.   iron polysaccharides 150 MG capsule Commonly known as: NIFEREX Take 150 mg by mouth every morning.   loratadine 10 MG tablet Commonly known as: CLARITIN Take 10 mg by mouth daily as needed for allergies.   niacin 500 MG tablet Take 500 mg by mouth daily with breakfast.   olmesartan 20 MG tablet Commonly known as: BENICAR Take 20 mg by mouth daily.   oxyCODONE 5 MG immediate release tablet Commonly known as: Oxy IR/ROXICODONE Take 1-2 tablets (5-10 mg total) by mouth every 4 (four) hours as needed for severe pain ((score 7 to 10)).   Potassium 99 MG Tabs Take 99 mg by mouth daily.   sodium chloride 0.65 % Soln nasal spray Commonly known as: OCEAN Place 1 spray into both nostrils as  needed for congestion.   VITAMIN B-12 PO Place 1 tablet under the tongue daily.   vitamin C 500 MG tablet Commonly known as: ASCORBIC ACID Take 500 mg by mouth daily.   vitamin E 180 MG (400 UNITS) capsule Take 400 Units by mouth daily.            Durable Medical Equipment  (From admission, onward)         Start     Ordered   01/28/20 1305  DME Walker rolling  Once    Question:  Patient needs a walker to treat with the following condition  Answer:  Degenerative spondylolisthesis   01/28/20 1304   01/28/20 1305  DME 3 n 1  Once     01/28/20 1304         Follow-up Information    Earnie Larsson, MD. Go on 02/12/2020.   Specialty: Neurosurgery Why: Appointment is scheduled for 2:15 pm Contact information: [address] [place] [address] [phone]           Signed: Patricia Nettle 01/30/2020, 1:45 PM

## 2020-01-30 NOTE — Progress Notes (Signed)
Postop day 2.  Patient looks much better today.  Pain very well controlled.  Notes a little bit of residual weakness in her left thigh but otherwise has no complaints.  Standing and ambulating with minimal assistance.  No issues with her wound.  She is afebrile.  Vital signs are stable.  Urine output is good.  She is awake and alert.  She is oriented and appropriate.  Motor and sensory exam grossly intact.  Abdomen soft.  Wound clean and dry.  Overall progressing well.  Continue efforts at mobilization and rehabilitation.  Plan for discharge to skilled nursing facility when bed available.

## 2020-01-31 LAB — BPAM RBC
Blood Product Expiration Date: 202103162359
Blood Product Expiration Date: 202103192359
ISSUE DATE / TIME: 202102230847
ISSUE DATE / TIME: 202102230847
Unit Type and Rh: 6200
Unit Type and Rh: 6200

## 2020-01-31 LAB — TYPE AND SCREEN
ABO/RH(D): A POS
Antibody Screen: NEGATIVE
Unit division: 0
Unit division: 0

## 2020-01-31 MED FILL — Sodium Chloride IV Soln 0.9%: INTRAVENOUS | Qty: 2000 | Status: AC

## 2020-01-31 MED FILL — Heparin Sodium (Porcine) Inj 1000 Unit/ML: INTRAMUSCULAR | Qty: 30 | Status: AC

## 2020-02-06 ENCOUNTER — Emergency Department (HOSPITAL_COMMUNITY): Payer: Medicare Other

## 2020-02-06 ENCOUNTER — Inpatient Hospital Stay (HOSPITAL_COMMUNITY): Payer: Medicare Other

## 2020-02-06 ENCOUNTER — Encounter (HOSPITAL_COMMUNITY): Payer: Self-pay | Admitting: Internal Medicine

## 2020-02-06 ENCOUNTER — Inpatient Hospital Stay (HOSPITAL_COMMUNITY)
Admission: EM | Admit: 2020-02-06 | Discharge: 2020-03-05 | DRG: 870 | Disposition: E | Payer: Medicare Other | Attending: Internal Medicine | Admitting: Internal Medicine

## 2020-02-06 ENCOUNTER — Other Ambulatory Visit: Payer: Self-pay

## 2020-02-06 ENCOUNTER — Inpatient Hospital Stay (HOSPITAL_COMMUNITY)
Admit: 2020-02-06 | Discharge: 2020-02-06 | Disposition: A | Discharge status: Home / Self Care | Payer: Medicare Other | Attending: Acute Care | Admitting: Acute Care

## 2020-02-06 DIAGNOSIS — R195 Other fecal abnormalities: Secondary | ICD-10-CM | POA: Diagnosis not present

## 2020-02-06 DIAGNOSIS — F329 Major depressive disorder, single episode, unspecified: Secondary | ICD-10-CM | POA: Diagnosis present

## 2020-02-06 DIAGNOSIS — G911 Obstructive hydrocephalus: Secondary | ICD-10-CM | POA: Diagnosis present

## 2020-02-06 DIAGNOSIS — R34 Anuria and oliguria: Secondary | ICD-10-CM | POA: Diagnosis present

## 2020-02-06 DIAGNOSIS — Z8249 Family history of ischemic heart disease and other diseases of the circulatory system: Secondary | ICD-10-CM

## 2020-02-06 DIAGNOSIS — R001 Bradycardia, unspecified: Secondary | ICD-10-CM | POA: Diagnosis not present

## 2020-02-06 DIAGNOSIS — M4310 Spondylolisthesis, site unspecified: Secondary | ICD-10-CM | POA: Diagnosis present

## 2020-02-06 DIAGNOSIS — M810 Age-related osteoporosis without current pathological fracture: Secondary | ICD-10-CM | POA: Diagnosis present

## 2020-02-06 DIAGNOSIS — F419 Anxiety disorder, unspecified: Secondary | ICD-10-CM | POA: Diagnosis present

## 2020-02-06 DIAGNOSIS — J9601 Acute respiratory failure with hypoxia: Secondary | ICD-10-CM | POA: Diagnosis present

## 2020-02-06 DIAGNOSIS — Z03818 Encounter for observation for suspected exposure to other biological agents ruled out: Secondary | ICD-10-CM | POA: Diagnosis not present

## 2020-02-06 DIAGNOSIS — M1990 Unspecified osteoarthritis, unspecified site: Secondary | ICD-10-CM | POA: Diagnosis present

## 2020-02-06 DIAGNOSIS — G9341 Metabolic encephalopathy: Secondary | ICD-10-CM | POA: Diagnosis not present

## 2020-02-06 DIAGNOSIS — R609 Edema, unspecified: Secondary | ICD-10-CM | POA: Diagnosis not present

## 2020-02-06 DIAGNOSIS — R4182 Altered mental status, unspecified: Secondary | ICD-10-CM | POA: Insufficient documentation

## 2020-02-06 DIAGNOSIS — I160 Hypertensive urgency: Secondary | ICD-10-CM | POA: Diagnosis present

## 2020-02-06 DIAGNOSIS — J690 Pneumonitis due to inhalation of food and vomit: Secondary | ICD-10-CM | POA: Diagnosis present

## 2020-02-06 DIAGNOSIS — K219 Gastro-esophageal reflux disease without esophagitis: Secondary | ICD-10-CM | POA: Diagnosis present

## 2020-02-06 DIAGNOSIS — Z66 Do not resuscitate: Secondary | ICD-10-CM | POA: Diagnosis not present

## 2020-02-06 DIAGNOSIS — Z888 Allergy status to other drugs, medicaments and biological substances status: Secondary | ICD-10-CM

## 2020-02-06 DIAGNOSIS — R4020 Unspecified coma: Secondary | ICD-10-CM | POA: Diagnosis not present

## 2020-02-06 DIAGNOSIS — Z974 Presence of external hearing-aid: Secondary | ICD-10-CM

## 2020-02-06 DIAGNOSIS — A419 Sepsis, unspecified organism: Principal | ICD-10-CM | POA: Diagnosis present

## 2020-02-06 DIAGNOSIS — Z833 Family history of diabetes mellitus: Secondary | ICD-10-CM

## 2020-02-06 DIAGNOSIS — E785 Hyperlipidemia, unspecified: Secondary | ICD-10-CM | POA: Diagnosis present

## 2020-02-06 DIAGNOSIS — E7800 Pure hypercholesterolemia, unspecified: Secondary | ICD-10-CM | POA: Diagnosis present

## 2020-02-06 DIAGNOSIS — H409 Unspecified glaucoma: Secondary | ICD-10-CM | POA: Diagnosis present

## 2020-02-06 DIAGNOSIS — R4701 Aphasia: Secondary | ICD-10-CM | POA: Diagnosis present

## 2020-02-06 DIAGNOSIS — G92 Toxic encephalopathy: Secondary | ICD-10-CM | POA: Diagnosis present

## 2020-02-06 DIAGNOSIS — E860 Dehydration: Secondary | ICD-10-CM | POA: Diagnosis present

## 2020-02-06 DIAGNOSIS — Z880 Allergy status to penicillin: Secondary | ICD-10-CM

## 2020-02-06 DIAGNOSIS — Z882 Allergy status to sulfonamides status: Secondary | ICD-10-CM

## 2020-02-06 DIAGNOSIS — G935 Compression of brain: Secondary | ICD-10-CM | POA: Diagnosis present

## 2020-02-06 DIAGNOSIS — G0490 Encephalitis and encephalomyelitis, unspecified: Secondary | ICD-10-CM | POA: Diagnosis present

## 2020-02-06 DIAGNOSIS — R6521 Severe sepsis with septic shock: Secondary | ICD-10-CM | POA: Diagnosis present

## 2020-02-06 DIAGNOSIS — J45909 Unspecified asthma, uncomplicated: Secondary | ICD-10-CM | POA: Diagnosis present

## 2020-02-06 DIAGNOSIS — R404 Transient alteration of awareness: Secondary | ICD-10-CM | POA: Diagnosis not present

## 2020-02-06 DIAGNOSIS — J9811 Atelectasis: Secondary | ICD-10-CM | POA: Diagnosis not present

## 2020-02-06 DIAGNOSIS — G919 Hydrocephalus, unspecified: Secondary | ICD-10-CM | POA: Diagnosis not present

## 2020-02-06 DIAGNOSIS — Z20822 Contact with and (suspected) exposure to covid-19: Secondary | ICD-10-CM | POA: Diagnosis present

## 2020-02-06 DIAGNOSIS — H9190 Unspecified hearing loss, unspecified ear: Secondary | ICD-10-CM | POA: Diagnosis present

## 2020-02-06 DIAGNOSIS — Z4682 Encounter for fitting and adjustment of non-vascular catheter: Secondary | ICD-10-CM | POA: Diagnosis not present

## 2020-02-06 DIAGNOSIS — R6520 Severe sepsis without septic shock: Secondary | ICD-10-CM | POA: Diagnosis present

## 2020-02-06 DIAGNOSIS — Z515 Encounter for palliative care: Secondary | ICD-10-CM | POA: Diagnosis not present

## 2020-02-06 DIAGNOSIS — G936 Cerebral edema: Secondary | ICD-10-CM | POA: Diagnosis present

## 2020-02-06 DIAGNOSIS — G039 Meningitis, unspecified: Secondary | ICD-10-CM | POA: Diagnosis not present

## 2020-02-06 DIAGNOSIS — Z981 Arthrodesis status: Secondary | ICD-10-CM | POA: Diagnosis not present

## 2020-02-06 DIAGNOSIS — R29898 Other symptoms and signs involving the musculoskeletal system: Secondary | ICD-10-CM | POA: Diagnosis not present

## 2020-02-06 DIAGNOSIS — D649 Anemia, unspecified: Secondary | ICD-10-CM | POA: Diagnosis present

## 2020-02-06 DIAGNOSIS — I10 Essential (primary) hypertension: Secondary | ICD-10-CM | POA: Diagnosis present

## 2020-02-06 DIAGNOSIS — Z9289 Personal history of other medical treatment: Secondary | ICD-10-CM

## 2020-02-06 DIAGNOSIS — Z79891 Long term (current) use of opiate analgesic: Secondary | ICD-10-CM

## 2020-02-06 DIAGNOSIS — E871 Hypo-osmolality and hyponatremia: Secondary | ICD-10-CM | POA: Diagnosis not present

## 2020-02-06 DIAGNOSIS — Z79899 Other long term (current) drug therapy: Secondary | ICD-10-CM

## 2020-02-06 DIAGNOSIS — R52 Pain, unspecified: Secondary | ICD-10-CM | POA: Diagnosis not present

## 2020-02-06 DIAGNOSIS — R29721 NIHSS score 21: Secondary | ICD-10-CM | POA: Diagnosis present

## 2020-02-06 DIAGNOSIS — E876 Hypokalemia: Secondary | ICD-10-CM | POA: Diagnosis present

## 2020-02-06 DIAGNOSIS — I491 Atrial premature depolarization: Secondary | ICD-10-CM | POA: Diagnosis not present

## 2020-02-06 DIAGNOSIS — Z9049 Acquired absence of other specified parts of digestive tract: Secondary | ICD-10-CM

## 2020-02-06 DIAGNOSIS — J90 Pleural effusion, not elsewhere classified: Secondary | ICD-10-CM | POA: Diagnosis not present

## 2020-02-06 DIAGNOSIS — Z452 Encounter for adjustment and management of vascular access device: Secondary | ICD-10-CM | POA: Diagnosis not present

## 2020-02-06 DIAGNOSIS — E861 Hypovolemia: Secondary | ICD-10-CM | POA: Diagnosis present

## 2020-02-06 LAB — BASIC METABOLIC PANEL
Anion gap: 12 (ref 5–15)
Anion gap: 12 (ref 5–15)
BUN: 9 mg/dL (ref 8–23)
BUN: 11 mg/dL (ref 8–23)
CO2: 23 mmol/L (ref 22–32)
CO2: 23 mmol/L (ref 22–32)
Calcium: 8.7 mg/dL — ABNORMAL LOW (ref 8.9–10.3)
Calcium: 8.4 mg/dL — ABNORMAL LOW (ref 8.9–10.3)
Chloride: 83 mmol/L — ABNORMAL LOW (ref 98–111)
Chloride: 88 mmol/L — ABNORMAL LOW (ref 98–111)
Creatinine, Ser: 0.57 mg/dL (ref 0.44–1.00)
Creatinine, Ser: 0.55 mg/dL (ref 0.44–1.00)
GFR calc Af Amer: >60 mL/min (ref >60)
GFR calc Af Amer: >60 mL/min (ref >60)
GFR calc non Af Amer: >60 mL/min (ref >60)
GFR calc non Af Amer: >60 mL/min (ref >60)
Glucose, Bld: 162 mg/dL — ABNORMAL HIGH (ref 70–99)
Glucose, Bld: 118 mg/dL — ABNORMAL HIGH (ref 70–99)
Potassium: 4.2 mmol/L (ref 3.5–5.1)
Potassium: 3.9 mmol/L (ref 3.5–5.1)
Sodium: 118 mmol/L — CL (ref 135–145)
Sodium: 123 mmol/L — ABNORMAL LOW (ref 135–145)

## 2020-02-06 LAB — COMPREHENSIVE METABOLIC PANEL
ALT: 41 U/L (ref 0–44)
AST: 27 U/L (ref 15–41)
Albumin: 3.2 g/dL — ABNORMAL LOW (ref 3.5–5.0)
Alkaline Phosphatase: 63 U/L (ref 38–126)
Anion gap: 12 (ref 5–15)
BUN: 10 mg/dL (ref 8–23)
CO2: 24 mmol/L (ref 22–32)
Calcium: 9.4 mg/dL (ref 8.9–10.3)
Chloride: 81 mmol/L — ABNORMAL LOW (ref 98–111)
Creatinine, Ser: 0.55 mg/dL (ref 0.44–1.00)
GFR calc Af Amer: >60 mL/min (ref >60)
GFR calc non Af Amer: >60 mL/min (ref >60)
Glucose, Bld: 171 mg/dL — ABNORMAL HIGH (ref 70–99)
Potassium: 3.5 mmol/L (ref 3.5–5.1)
Sodium: 117 mmol/L — CL (ref 135–145)
Total Bilirubin: 1.5 mg/dL — ABNORMAL HIGH (ref 0.3–1.2)
Total Protein: 7 g/dL (ref 6.5–8.1)

## 2020-02-06 LAB — POCT I-STAT 7, (LYTES, BLD GAS, ICA,H+H)
Acid-base deficit: 3 mmol/L — ABNORMAL HIGH (ref 0.0–2.0)
Bicarbonate: 23.3 mmol/L (ref 20.0–28.0)
Calcium, Ion: 1.2 mmol/L (ref 1.15–1.40)
HCT: 30 % — ABNORMAL LOW (ref 36.0–46.0)
Hemoglobin: 10.2 g/dL — ABNORMAL LOW (ref 12.0–15.0)
O2 Saturation: 92 %
Potassium: 3.1 mmol/L — ABNORMAL LOW (ref 3.5–5.1)
Sodium: 121 mmol/L — ABNORMAL LOW (ref 135–145)
TCO2: 25 mmol/L (ref 22–32)
pCO2 arterial: 44.5 mmHg (ref 32.0–48.0)
pH, Arterial: 7.328 — ABNORMAL LOW (ref 7.350–7.450)
pO2, Arterial: 69 mmHg — ABNORMAL LOW (ref 83.0–108.0)

## 2020-02-06 LAB — RAPID URINE DRUG SCREEN, HOSP PERFORMED
Amphetamines: NOT DETECTED
Barbiturates: NOT DETECTED
Benzodiazepines: POSITIVE — AB
Cocaine: NOT DETECTED
Opiates: NOT DETECTED
Tetrahydrocannabinol: NOT DETECTED

## 2020-02-06 LAB — SODIUM, URINE, RANDOM: Sodium, Ur: 95 mmol/L

## 2020-02-06 LAB — CBC
HCT: 30.8 % — ABNORMAL LOW (ref 36.0–46.0)
HCT: 30.4 % — ABNORMAL LOW (ref 36.0–46.0)
Hemoglobin: 10.3 g/dL — ABNORMAL LOW (ref 12.0–15.0)
Hemoglobin: 10 g/dL — ABNORMAL LOW (ref 12.0–15.0)
MCH: 27 pg (ref 26.0–34.0)
MCH: 26.5 pg (ref 26.0–34.0)
MCHC: 33.4 g/dL (ref 30.0–36.0)
MCHC: 32.9 g/dL (ref 30.0–36.0)
MCV: 80.8 fL (ref 80.0–100.0)
MCV: 80.6 fL (ref 80.0–100.0)
Platelets: 502 10*3/uL — ABNORMAL HIGH (ref 150–400)
Platelets: 441 10*3/uL — ABNORMAL HIGH (ref 150–400)
RBC: 3.81 MIL/uL — ABNORMAL LOW (ref 3.87–5.11)
RBC: 3.77 MIL/uL — ABNORMAL LOW (ref 3.87–5.11)
RDW: 13 % (ref 11.5–15.5)
RDW: 13.1 % (ref 11.5–15.5)
WBC: 21.4 10*3/uL — ABNORMAL HIGH (ref 4.0–10.5)
WBC: 11.2 10*3/uL — ABNORMAL HIGH (ref 4.0–10.5)
nRBC: 0 % (ref 0.0–0.2)
nRBC: 0 % (ref 0.0–0.2)

## 2020-02-06 LAB — POCT I-STAT EG7
Acid-Base Excess: 4 mmol/L — ABNORMAL HIGH (ref 0.0–2.0)
Bicarbonate: 26.6 mmol/L (ref 20.0–28.0)
Calcium, Ion: 1.06 mmol/L — ABNORMAL LOW (ref 1.15–1.40)
HCT: 33 % — ABNORMAL LOW (ref 36.0–46.0)
Hemoglobin: 11.2 g/dL — ABNORMAL LOW (ref 12.0–15.0)
O2 Saturation: 100 %
Potassium: 3.4 mmol/L — ABNORMAL LOW (ref 3.5–5.1)
Sodium: 116 mmol/L — CL (ref 135–145)
TCO2: 28 mmol/L (ref 22–32)
pCO2, Ven: 31.2 mmHg — ABNORMAL LOW (ref 44.0–60.0)
pH, Ven: 7.539 — ABNORMAL HIGH (ref 7.250–7.430)
pO2, Ven: 189 mmHg — ABNORMAL HIGH (ref 32.0–45.0)

## 2020-02-06 LAB — HEMOGLOBIN A1C
Hgb A1c MFr Bld: 5.3 % (ref 4.8–5.6)
Mean Plasma Glucose: 105.41 mg/dL

## 2020-02-06 LAB — URINALYSIS, ROUTINE W REFLEX MICROSCOPIC
Bilirubin Urine: NEGATIVE
Glucose, UA: 50 mg/dL — AB
Ketones, ur: 5 mg/dL — AB
Leukocytes,Ua: NEGATIVE
Nitrite: NEGATIVE
Protein, ur: 30 mg/dL — AB
Specific Gravity, Urine: 1.014 (ref 1.005–1.030)
pH: 8 (ref 5.0–8.0)

## 2020-02-06 LAB — LIPASE, BLOOD: Lipase: 22 U/L (ref 11–51)

## 2020-02-06 LAB — I-STAT CHEM 8, ED
BUN: 9 mg/dL (ref 8–23)
Calcium, Ion: 1.16 mmol/L (ref 1.15–1.40)
Chloride: 78 mmol/L — ABNORMAL LOW (ref 98–111)
Creatinine, Ser: 0.5 mg/dL (ref 0.44–1.00)
Glucose, Bld: 181 mg/dL — ABNORMAL HIGH (ref 70–99)
HCT: 31 % — ABNORMAL LOW (ref 36.0–46.0)
Hemoglobin: 10.5 g/dL — ABNORMAL LOW (ref 12.0–15.0)
Potassium: 3.5 mmol/L (ref 3.5–5.1)
Sodium: 116 mmol/L — CL (ref 135–145)
TCO2: 28 mmol/L (ref 22–32)

## 2020-02-06 LAB — DIFFERENTIAL
Abs Immature Granulocytes: 0.12 10*3/uL — ABNORMAL HIGH (ref 0.00–0.07)
Basophils Absolute: 0 10*3/uL (ref 0.0–0.1)
Basophils Relative: 0 %
Eosinophils Absolute: 0 10*3/uL (ref 0.0–0.5)
Eosinophils Relative: 0 %
Immature Granulocytes: 1 %
Lymphocytes Relative: 2 %
Lymphs Abs: 0.4 10*3/uL — ABNORMAL LOW (ref 0.7–4.0)
Monocytes Absolute: 0.8 10*3/uL (ref 0.1–1.0)
Monocytes Relative: 4 %
Neutro Abs: 19.8 10*3/uL — ABNORMAL HIGH (ref 1.7–7.7)
Neutrophils Relative %: 93 %

## 2020-02-06 LAB — OSMOLALITY, URINE: Osmolality, Ur: 491 mOsm/kg (ref 300–900)

## 2020-02-06 LAB — RESPIRATORY PANEL BY RT PCR (FLU A&B, COVID)
Influenza A by PCR: NEGATIVE
Influenza B by PCR: NEGATIVE
SARS Coronavirus 2 by RT PCR: NEGATIVE

## 2020-02-06 LAB — GLUCOSE, CAPILLARY
Glucose-Capillary: 124 mg/dL — ABNORMAL HIGH (ref 70–99)
Glucose-Capillary: 134 mg/dL — ABNORMAL HIGH (ref 70–99)
Glucose-Capillary: 101 mg/dL — ABNORMAL HIGH (ref 70–99)

## 2020-02-06 LAB — MRSA PCR SCREENING: MRSA by PCR: NEGATIVE

## 2020-02-06 LAB — POC SARS CORONAVIRUS 2 AG: SARS Coronavirus 2 Ag: NEGATIVE

## 2020-02-06 LAB — LACTIC ACID, PLASMA: Lactic Acid, Venous: 1.7 mmol/L (ref 0.5–1.9)

## 2020-02-06 LAB — SARS CORONAVIRUS 2 (TAT 6-24 HRS): SARS Coronavirus 2: NEGATIVE

## 2020-02-06 LAB — CBG MONITORING, ED
Glucose-Capillary: 176 mg/dL — ABNORMAL HIGH (ref 70–99)
Glucose-Capillary: 130 mg/dL — ABNORMAL HIGH (ref 70–99)

## 2020-02-06 LAB — APTT: aPTT: 28 seconds (ref 24–36)

## 2020-02-06 LAB — PROTIME-INR
INR: 1 (ref 0.8–1.2)
Prothrombin Time: 12.9 seconds (ref 11.4–15.2)

## 2020-02-06 LAB — ETHANOL: Alcohol, Ethyl (B): <10 mg/dL (ref <10)

## 2020-02-06 MED ORDER — PANTOPRAZOLE SODIUM 40 MG IV SOLR
40.0000 mg | Freq: Every day | INTRAVENOUS | Status: DC
  Administered 2020-02-06: 40 mg via INTRAVENOUS
  Filled 2020-02-06: qty 40

## 2020-02-06 MED ORDER — ROCURONIUM BROMIDE 50 MG/5ML IV SOLN
INTRAVENOUS | Status: AC | PRN
  Administered 2020-02-06: 70 mg via INTRAVENOUS

## 2020-02-06 MED ORDER — LIDOCAINE HCL (PF) 0.5 % IJ SOLN
30.0000 mL | Freq: Once | INTRAMUSCULAR | Status: AC
  Administered 2020-02-06: 30 mL via INTRADERMAL
  Filled 2020-02-06: qty 50

## 2020-02-06 MED ORDER — MIDAZOLAM HCL 2 MG/2ML IJ SOLN: 1.0000 mg | INTRAMUSCULAR | Status: DC | PRN

## 2020-02-06 MED ORDER — FENTANYL BOLUS VIA INFUSION
25.0000 ug | INTRAVENOUS | Status: DC | PRN
  Filled 2020-02-06: qty 25

## 2020-02-06 MED ORDER — ORAL CARE MOUTH RINSE
15.0000 mL | OROMUCOSAL | Status: DC
  Administered 2020-02-06 – 2020-02-10 (×39): 15 mL via OROMUCOSAL

## 2020-02-06 MED ORDER — MIDAZOLAM HCL 2 MG/2ML IJ SOLN
INTRAMUSCULAR | Status: AC
  Filled 2020-02-06: qty 2

## 2020-02-06 MED ORDER — FENTANYL CITRATE (PF) 100 MCG/2ML IJ SOLN: 25.0000 ug | INTRAMUSCULAR | Status: DC | PRN

## 2020-02-06 MED ORDER — HYDROMORPHONE HCL 1 MG/ML IJ SOLN
0.5000 mg | Freq: Once | INTRAMUSCULAR | Status: AC
  Administered 2020-02-06: 12:00:00 0.5 mg via INTRAVENOUS
  Filled 2020-02-06: qty 1

## 2020-02-06 MED ORDER — FENTANYL CITRATE (PF) 100 MCG/2ML IJ SOLN
INTRAMUSCULAR | Status: AC
  Filled 2020-02-06: qty 2

## 2020-02-06 MED ORDER — SODIUM CHLORIDE 0.9 % IV SOLN
1.0000 g | Freq: Three times a day (TID) | INTRAVENOUS | Status: DC
  Administered 2020-02-06 – 2020-02-07 (×3): 1 g via INTRAVENOUS
  Filled 2020-02-06 (×5): qty 1

## 2020-02-06 MED ORDER — HYDRALAZINE HCL 20 MG/ML IJ SOLN
10.0000 mg | INTRAMUSCULAR | Status: DC | PRN
  Administered 2020-02-06: 10 mg via INTRAVENOUS
  Filled 2020-02-06: qty 1

## 2020-02-06 MED ORDER — DOCUSATE SODIUM 50 MG/5ML PO LIQD
100.0000 mg | Freq: Two times a day (BID) | ORAL | Status: DC | PRN
  Filled 2020-02-06: qty 10

## 2020-02-06 MED ORDER — MIDAZOLAM HCL 5 MG/5ML IJ SOLN
INTRAMUSCULAR | Status: AC | PRN
  Administered 2020-02-06: 1 mg via INTRAVENOUS

## 2020-02-06 MED ORDER — VANCOMYCIN HCL IN DEXTROSE 1-5 GM/200ML-% IV SOLN: 1000.0000 mg | INTRAVENOUS | Status: DC

## 2020-02-06 MED ORDER — CHLORHEXIDINE GLUCONATE CLOTH 2 % EX PADS
6.0000 | MEDICATED_PAD | Freq: Every day | CUTANEOUS | Status: DC
  Administered 2020-02-07 – 2020-02-10 (×6): 6 via TOPICAL

## 2020-02-06 MED ORDER — SODIUM CHLORIDE 0.9 % IV SOLN: INTRAVENOUS | Status: DC

## 2020-02-06 MED ORDER — VANCOMYCIN HCL 1500 MG/300ML IV SOLN
1500.0000 mg | Freq: Once | INTRAVENOUS | Status: AC
  Administered 2020-02-06: 1500 mg via INTRAVENOUS
  Filled 2020-02-06: qty 300

## 2020-02-06 MED ORDER — HEPARIN SODIUM (PORCINE) 5000 UNIT/ML IJ SOLN
5000.0000 [IU] | Freq: Three times a day (TID) | INTRAMUSCULAR | Status: DC
  Administered 2020-02-06 – 2020-02-10 (×13): 5000 [IU] via SUBCUTANEOUS
  Filled 2020-02-06 (×13): qty 1

## 2020-02-06 MED ORDER — CHLORHEXIDINE GLUCONATE 0.12% ORAL RINSE (MEDLINE KIT)
15.0000 mL | Freq: Two times a day (BID) | OROMUCOSAL | Status: DC
  Administered 2020-02-06 – 2020-02-10 (×8): 15 mL via OROMUCOSAL

## 2020-02-06 MED ORDER — BISACODYL 10 MG RE SUPP: 10.0000 mg | Freq: Every day | RECTAL | Status: DC | PRN

## 2020-02-06 MED ORDER — LABETALOL HCL 5 MG/ML IV SOLN
INTRAVENOUS | Status: AC
  Filled 2020-02-06: qty 4

## 2020-02-06 MED ORDER — FENTANYL 2500MCG IN NS 250ML (10MCG/ML) PREMIX INFUSION
25.0000 ug/h | INTRAVENOUS | Status: DC
  Administered 2020-02-06 (×2): 75 ug/h via INTRAVENOUS
  Administered 2020-02-06 – 2020-02-08 (×2): 50 ug/h via INTRAVENOUS
  Filled 2020-02-06 (×2): qty 250

## 2020-02-06 MED ORDER — ONDANSETRON HCL 4 MG/2ML IJ SOLN: 4.0000 mg | Freq: Four times a day (QID) | INTRAMUSCULAR | Status: DC | PRN

## 2020-02-06 MED ORDER — FENTANYL CITRATE (PF) 100 MCG/2ML IJ SOLN
INTRAMUSCULAR | Status: AC | PRN
  Administered 2020-02-06: 50 ug via INTRAVENOUS

## 2020-02-06 MED ORDER — SODIUM CHLORIDE 0.9 % IV BOLUS
1000.0000 mL | Freq: Once | INTRAVENOUS | Status: AC
  Administered 2020-02-06: 1000 mL via INTRAVENOUS

## 2020-02-06 MED ORDER — ETOMIDATE 2 MG/ML IV SOLN
INTRAVENOUS | Status: AC | PRN
  Administered 2020-02-06: 20 mg via INTRAVENOUS

## 2020-02-06 MED ORDER — ACETAMINOPHEN 325 MG PO TABS: 650.0000 mg | ORAL_TABLET | ORAL | Status: DC | PRN

## 2020-02-06 MED ORDER — LABETALOL HCL 5 MG/ML IV SOLN
10.0000 mg | INTRAVENOUS | Status: DC | PRN
  Administered 2020-02-06 – 2020-02-08 (×2): 10 mg via INTRAVENOUS
  Filled 2020-02-06: qty 4

## 2020-02-06 MED ORDER — INSULIN ASPART 100 UNIT/ML ~~LOC~~ SOLN
0.0000 [IU] | SUBCUTANEOUS | Status: DC
  Administered 2020-02-06 – 2020-02-08 (×9): 1 [IU] via SUBCUTANEOUS
  Administered 2020-02-09 (×2): 2 [IU] via SUBCUTANEOUS
  Administered 2020-02-09: 1 [IU] via SUBCUTANEOUS
  Administered 2020-02-09: 05:00:00 2 [IU] via SUBCUTANEOUS
  Administered 2020-02-09 (×2): 1 [IU] via SUBCUTANEOUS

## 2020-02-06 NOTE — Progress Notes (Signed)
Pharmacy Antibiotic Note  Beverly Jimenez is a 86 y.o. female admitted on 02/05/2020 with suspected sepsis. Pharmacy has been consulted for Vancomycin and aztreonam dosing. She has a PCN allergy listed as high severity with no history of cephalosporin use.  She had a recent hospitalization (2/23 - 2/25) for posterior lumbar fusion after which she was discharged to SNF for rehab who presented with complaint of AMS.  WBC 21.4>11.2, lactate 1.7, afebrile. Low threshold to d/c antibiotics per critical care team.   Plan: Vancomycin 1500mg  IV x1 then 1000mg  IV every 24h Goal AUC 400-550 Expected AUC: 478 SCr used: 0.8 Aztreonam 1g IV Q8h  F/u clinical progress, cultures, and LOT   Temp (24hrs), Avg:98.6 F (37 C), Min:98.2 F (36.8 C), Max:98.9 F (37.2 C)  Recent Labs  Lab 02/24/2020 0940 02/09/2020 1040 02/26/2020 1400 02/05/2020 1411  WBC 21.4*  --   --  11.2*  CREATININE 0.55 0.50 0.57  --   LATICACIDVEN 1.7  --   --   --     Estimated Creatinine Clearance: 45.8 mL/min (by C-G formula based on SCr of 0.57 mg/dL).    Allergies  Allergen Reactions  . Amoxicillin Hives, Itching, Rash and Other (See Comments)    Has patient had a PCN reaction causing immediate rash, facial/tongue/throat swelling, SOB or lightheadedness with hypotension: Unknown Has patient had a PCN reaction causing SEVERE rash involving mucus membranes or skin necrosis:    #  #  YES  #  #  Has patient had a PCN reaction that required hospitalization: No Has patient had a PCN reaction occurring within the last 10 years: No    . Macrodantin [Nitrofurantoin Macrocrystal] Shortness Of Breath  . Penicillins Other (See Comments)    SEE AMOXICILLIN  . Sulfa Antibiotics Shortness Of Breath and Rash  . Cymbalta [Duloxetine Hcl]     UNSPECIFIED REACTION     Antimicrobials this admission: 3/4 vancomycin >>  3/4 aztreonam >>   Dose adjustments this admission: N/A  Microbiology results: 3/4 BCx: sent 3/4 UCx: sent   3/4 MRSA PCR: sent  Thank you for allowing pharmacy to be a part of this patient's care.  Kennon Holter, PharmD PGY1 Ambulatory Care Pharmacy Resident 02/16/2020 3:31 PM

## 2020-02-06 NOTE — Progress Notes (Signed)
RT called to bedside to NT suction pt as she had aspirated while laying flat (per Neurology) Pt sitting up at 20 degree angle now. Small amount of tan thick secretions removed from Right nare x2, and copious amount of Tan thick secretions removed from Left Nare x2. Pt stable throughout with no complications. NP and RN at bedside. Pt may benefit from NTS again to avoid intubation.

## 2020-02-06 NOTE — Progress Notes (Signed)
CSW received consult for patient to obtain updated contact and POA information. Patient's POA documents are available for review in the media tab. The patient's son and daughter are listed as her emergency contact. Guido Sander contacted [facility] CSW this morning to express his desire for the patient to go to CIR instead of back to [place].  Madilyn Fireman, MSW, LCSW-A Transitions of Care  Clinical Social Worker  [facility] Emergency Departments  Medical ICU [phone]

## 2020-02-06 NOTE — Procedures (Addendum)
Intubation Procedure Note FARIN HELGESEN [id number] 1925-04-29  Procedure: Intubation Indications: Airway protection and maintenance  Procedure Details Consent: Risks of procedure as well as the alternatives and risks of each were explained to the (patient/caregiver).  Consent for procedure obtained. Time Out: Verified patient identification, verified procedure, site/side was marked, verified correct patient position, special equipment/implants available, medications/allergies/relevent history reviewed, required imaging and test results available.  Performed  Maximum sterile technique was used including antiseptics, gloves, hand hygiene and mask. Glidescope 3 used to directly visualize cords.    Evaluation Hemodynamic Status: Transient hypotension treated with fluid; O2 sats: stable throughout Patient's Current Condition: stable Complications: No apparent complications Patient did tolerate procedure well. Chest X-ray ordered to verify placement.  CXR: pending.  Johnsie Cancel, NP-C [facility] Contact / Pager information can be found on Amion  02/05/2020, 4:11 PM

## 2020-02-06 NOTE — Procedures (Signed)
Patient Name: Beverly Jimenez  MRN: [id number]  Epilepsy Attending: Lora Havens  Referring Physician/Provider: Etta Quill, PA Date: 02/26/2020 Duration: 24.59 minutes  Patient history: 86 year old female presented with altered mental status.  On exam was noted to have right gaze preference.  EEG to evaluate for seizures.  Level of alertness: Lethargic  AEDs during EEG study: None  Technical aspects: This EEG study was done with scalp electrodes positioned according to the 10-20 International system of electrode placement. Electrical activity was acquired at a sampling rate of 500Hz  and reviewed with a high frequency filter of 70Hz  and a low frequency filter of 1Hz . EEG data were recorded continuously and digitally stored.   Description: No clear posterior dominant rhythm was seen.  EEG showed continuous generalized 3 to 6 Hz theta-delta slowing.  Hyperventilation and photic stimulation were not performed due to altered mental status.  Abnormality - Continuous slow, generalized  IMPRESSION: This study is suggestive of moderate diffuse encephalopathy, nonspecific to etiology. No seizures or epileptiform discharges were seen throughout the recording.  Dr. Lorraine Lax was notified.  [name] [name]

## 2020-02-06 NOTE — Code Documentation (Signed)
86 yo female coming from [place] after she was noted to stop talking and have abnormal gaze per facility. Pt has hx of Lumbar ACDIF. LKW noted at 1500. At 2300, pt was noted to have increased drainage from surgical site and reported some pain along with moaning. This morning, pt was noted to have increased moaning with no repsonse to questioning or following commands. EMS called and brought patient to the ED.   Upon arrival to the ED, EDP noted right gaze preference and bilateral weakness. Code Stroke activated. Stroke Team met patient in room. Initial NIHSS 21 - due to right gaze preference, bilateral arm and leg weakness, and aphasia. Pt taken to CT. CT Head completed - no hemorrhage. Not a tPA candidate due to being outside window. Brought back to ED. Remains on cardiac monitor.   Plan of Care: Code Stroke cancelled. Stroke not suspected due to increase WBCs and abnormal labs along with recent surgery.

## 2020-02-06 NOTE — Consult Note (Signed)
Providing Compassionate, Quality Care - Together   Reason for Consult: Suspected CSF leak Referring Physician: Dr. Wendee Beavers Beverly Jimenez is an 86 y.o. female.  HPI: Beverly Jimenez underwent an L2-3 posterior lumbar decompression and fusion by Dr. Annette Stable on 01/28/2020. She did very well postoperatively and was discharged to [facility] skilled nursing facility for continued rehabilitation. She was taken to the [facility] emergency department this morning when [facility] staff found patient moaning and unable to answer questions. They reported the patient had been having some clear drainage from her surgical wound. In the ED, she was noted to have a right gaze preference, BUE and BLE weakness, and aphasia. CT head was negative and stroke was ruled out. Her WBC was elevated and sodium was 116. EEG concerning for encephalopathy. Neurosurgery was consulted due to the drainage coming from her incision.  Past Medical History:  Diagnosis Date   Anemia    Anxiety    Arthritis    shoulder and knee pain   Asthma    Constipation    Depression    Hx; of situational depression   GERD (gastroesophageal reflux disease)    Glaucoma    Hx; of   High cholesterol    History of hiatal hernia    Hypertension    Lumbar stenosis    Myalgia    Numbness and tingling    Hx: of B/LLE   Osteoporosis    Pneumonia    Hx: of "years ago"   Shortness of breath    Wears hearing aid    B/L    Past Surgical History:  Procedure Laterality Date   APPENDECTOMY     BACK SURGERY  01/2020   L2-4 fusion   CHOLECYSTECTOMY     COLONOSCOPY     Hx: of   COLONOSCOPY WITH PROPOFOL N/A 09/18/2017   Procedure: COLONOSCOPY WITH PROPOFOL;  Surgeon: Wilford Corner, MD;  Location: [facility] ENDOSCOPY;  Service: Endoscopy;  Laterality: N/A;   EYE SURGERY     catarac   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   HERNIA REPAIR     LUMBAR LAMINECTOMY/DECOMPRESSION MICRODISCECTOMY Left 07/05/2013   Procedure:  LUMBAR LAMINECTOMY/DECOMPRESSION MICRODISCECTOMY 1 LEVEL Left Lumbar five-Sacral one;  Surgeon: Charlie Pitter, MD;  Location: [facility] NEURO ORS;  Service: Neurosurgery;  Laterality: Left;  Left Lumbar Five-Sacral One Microdiskectomy   MULTIPLE TOOTH EXTRACTIONS     VASCULAR SURGERY     varicose vein surgery    Family History  Problem Relation Age of Onset   Varicose Veins Mother    Diabetes Brother    Heart disease Brother    Hypertension Brother    Varicose Veins Brother    Hypertension Other    Heart disease Other    Glaucoma Other    Diabetes Other    Varicose Veins Sister    Varicose Veins Daughter     Social History:  reports that she has never smoked. She has never used smokeless tobacco. She reports that she does not drink alcohol or use drugs.  Allergies:  Allergies  Allergen Reactions   Amoxicillin Hives, Itching, Rash and Other (See Comments)    Has patient had a PCN reaction causing immediate rash, facial/tongue/throat swelling, SOB or lightheadedness with hypotension: Unknown Has patient had a PCN reaction causing SEVERE rash involving mucus membranes or skin necrosis:    #  #  YES  #  #  Has patient had a PCN reaction that required hospitalization:  No Has patient had a PCN reaction occurring within the last 10 years: No     Macrodantin [Nitrofurantoin Macrocrystal] Shortness Of Breath   Penicillins Other (See Comments)    SEE AMOXICILLIN   Sulfa Antibiotics Shortness Of Breath and Rash   Cymbalta [Duloxetine Hcl]     UNSPECIFIED REACTION     Medications: I have reviewed the patient's current medications.  Results for orders placed or performed during the hospital encounter of 02/26/2020 (from the past 48 hour(s))  CBG monitoring, ED     Status: Abnormal   Collection Time: 03/01/2020  9:31 AM  Result Value Ref Range   Glucose-Capillary 176 (H) 70 - 99 mg/dL    Comment: Glucose reference range applies only to samples taken after fasting for at least 8  hours.  CBC     Status: Abnormal   Collection Time: 02/08/2020  9:40 AM  Result Value Ref Range   WBC 21.4 (H) 4.0 - 10.5 K/uL   RBC 3.81 (L) 3.87 - 5.11 MIL/uL   Hemoglobin 10.3 (L) 12.0 - 15.0 g/dL   HCT 30.8 (L) 36.0 - 46.0 %   MCV 80.8 80.0 - 100.0 fL   MCH 27.0 26.0 - 34.0 pg   MCHC 33.4 30.0 - 36.0 g/dL   RDW 13.0 11.5 - 15.5 %   Platelets 502 (H) 150 - 400 K/uL   nRBC 0.0 0.0 - 0.2 %    Comment: Performed at [facility] [address], [place], [place] [address]  Comprehensive metabolic panel     Status: Abnormal   Collection Time: 02/09/2020  9:40 AM  Result Value Ref Range   Sodium 117 (LL) 135 - 145 mmol/L    Comment: CRITICAL RESULT CALLED TO, READ BACK BY AND VERIFIED WITH: S.BAURCAN,RN  03/01/2020 1037 DAVISB    Potassium 3.5 3.5 - 5.1 mmol/L   Chloride 81 (L) 98 - 111 mmol/L   CO2 24 22 - 32 mmol/L   Glucose, Bld 171 (H) 70 - 99 mg/dL    Comment: Glucose reference range applies only to samples taken after fasting for at least 8 hours.   BUN 10 8 - 23 mg/dL   Creatinine, Ser 0.55 0.44 - 1.00 mg/dL   Calcium 9.4 8.9 - 10.3 mg/dL   Total Protein 7.0 6.5 - 8.1 g/dL   Albumin 3.2 (L) 3.5 - 5.0 g/dL   AST 27 15 - 41 U/L   ALT 41 0 - 44 U/L   Alkaline Phosphatase 63 38 - 126 U/L   Total Bilirubin 1.5 (H) 0.3 - 1.2 mg/dL   GFR calc non Af Amer >60 >60 mL/min   GFR calc Af Amer >60 >60 mL/min   Anion gap 12 5 - 15    Comment: Performed at [facility] [address], [place], [place] [address]  Lipase, blood     Status: None   Collection Time: 02/17/2020  9:40 AM  Result Value Ref Range   Lipase 22 11 - 51 U/L    Comment: Performed at [facility] [address], [place], [place] [address]  Lactic acid, plasma     Status: None   Collection Time: 03/04/2020  9:40 AM  Result Value Ref Range   Lactic Acid, Venous 1.7 0.5 - 1.9 mmol/L    Comment: Performed at [facility] [address], [place], [place] [address]  Ethanol     Status: None    Collection Time: 02/09/2020 10:09  AM  Result Value Ref Range   Alcohol, Ethyl (B) <10 <10 mg/dL    Comment: (NOTE) Lowest detectable limit for serum alcohol is 10 mg/dL. For medical purposes only. Performed at [facility], [place] [address] [place], [place] [address]   Protime-INR     Status: None   Collection Time: 02/09/2020 10:09 AM  Result Value Ref Range   Prothrombin Time 12.9 11.4 - 15.2 seconds   INR 1.0 0.8 - 1.2    Comment: (NOTE) INR goal varies based on device and disease states. Performed at [facility], [place] [address], [place], [place] [address]   APTT     Status: None   Collection Time: 02/07/2020 10:09 AM  Result Value Ref Range   aPTT 28 24 - 36 seconds    Comment: Performed at [facility] [address] [place], [place] [address]  Differential     Status: Abnormal   Collection Time: 02/09/2020 10:09 AM  Result Value Ref Range   Neutrophils Relative % 93 %   Neutro Abs 19.8 (H) 1.7 - 7.7 K/uL   Lymphocytes Relative 2 %   Lymphs Abs 0.4 (L) 0.7 - 4.0 K/uL   Monocytes Relative 4 %   Monocytes Absolute 0.8 0.1 - 1.0 K/uL   Eosinophils Relative 0 %   Eosinophils Absolute 0.0 0.0 - 0.5 K/uL   Basophils Relative 0 %   Basophils Absolute 0.0 0.0 - 0.1 K/uL   Immature Granulocytes 1 %   Abs Immature Granulocytes 0.12 (H) 0.00 - 0.07 K/uL    Comment: Performed at [facility] [address], [place], [place] [address]  POCT I-Stat EG7     Status: Abnormal   Collection Time: 02/13/2020 10:19 AM  Result Value Ref Range   pH, Ven 7.539 (H) 7.250 - 7.430   pCO2, Ven 31.2 (L) 44.0 - 60.0 mmHg   pO2, Ven 189.0 (H) 32.0 - 45.0 mmHg   Bicarbonate 26.6 20.0 - 28.0 mmol/L   TCO2 28 22 - 32 mmol/L   O2 Saturation 100.0 %   Acid-Base Excess 4.0 (H) 0.0 - 2.0 mmol/L   Sodium 116 (LL) 135 - 145 mmol/L   Potassium 3.4 (L) 3.5 - 5.1 mmol/L   Calcium, Ion 1.06 (L) 1.15 - 1.40 mmol/L   HCT 33.0 (L) 36.0 - 46.0 %   Hemoglobin 11.2 (L) 12.0 - 15.0 g/dL    Patient temperature HIDE    Sample type VENOUS    Comment NOTIFIED PHYSICIAN   Urinalysis, Routine w reflex microscopic     Status: Abnormal   Collection Time: 02/24/2020 10:32 AM  Result Value Ref Range   Color, Urine YELLOW YELLOW   APPearance CLOUDY (A) CLEAR   Specific Gravity, Urine 1.014 1.005 - 1.030   pH 8.0 5.0 - 8.0   Glucose, UA 50 (A) NEGATIVE mg/dL   Hgb urine dipstick MODERATE (A) NEGATIVE   Bilirubin Urine NEGATIVE NEGATIVE   Ketones, ur 5 (A) NEGATIVE mg/dL   Protein, ur 30 (A) NEGATIVE mg/dL   Nitrite NEGATIVE NEGATIVE   Leukocytes,Ua NEGATIVE NEGATIVE   RBC / HPF 6-10 0 - 5 RBC/hpf   WBC, UA 0-5 0 - 5 WBC/hpf   Bacteria, UA FEW (A) NONE SEEN   Amorphous Crystal PRESENT     Comment: Performed at [facility], [address] [place],  [address]  Urine rapid drug screen (hosp performed)     Status: Abnormal  Collection Time: 02/21/2020 10:32 AM  Result Value Ref Range   Opiates NONE DETECTED NONE DETECTED   Cocaine NONE DETECTED NONE DETECTED   Benzodiazepines POSITIVE (A) NONE DETECTED   Amphetamines NONE DETECTED NONE DETECTED   Tetrahydrocannabinol NONE DETECTED NONE DETECTED   Barbiturates NONE DETECTED NONE DETECTED    Comment: (NOTE) DRUG SCREEN FOR MEDICAL PURPOSES ONLY.  IF CONFIRMATION IS NEEDED FOR ANY PURPOSE, NOTIFY LAB WITHIN 5 DAYS. LOWEST DETECTABLE LIMITS FOR URINE DRUG SCREEN Drug Class                     Cutoff (ng/mL) Amphetamine and metabolites    1000 Barbiturate and metabolites    200 Benzodiazepine                 [id number] Tricyclics and metabolites     300 Opiates and metabolites        300 Cocaine and metabolites        300 THC                            50 Performed at [facility], [place] [address], [place], [place] [address]   I-stat chem 8, ED     Status: Abnormal   Collection Time: 02/13/2020 10:40 AM  Result Value Ref Range   Sodium 116 (LL) 135 - 145 mmol/L   Potassium 3.5 3.5 - 5.1 mmol/L   Chloride 78 (L)  98 - 111 mmol/L   BUN 9 8 - 23 mg/dL   Creatinine, Ser 0.50 0.44 - 1.00 mg/dL   Glucose, Bld 181 (H) 70 - 99 mg/dL    Comment: Glucose reference range applies only to samples taken after fasting for at least 8 hours.   Calcium, Ion 1.16 1.15 - 1.40 mmol/L   TCO2 28 22 - 32 mmol/L   Hemoglobin 10.5 (L) 12.0 - 15.0 g/dL   HCT 31.0 (L) 36.0 - 46.0 %   Comment NOTIFIED PHYSICIAN   Basic metabolic panel     Status: Abnormal   Collection Time: 02/21/2020  2:00 PM  Result Value Ref Range   Sodium 118 (LL) 135 - 145 mmol/L    Comment: CRITICAL RESULT CALLED TO, READ BACK BY AND VERIFIED WITH: M.COCHRANE RN 1505 02/05/20 MCCORMICK K    Potassium 4.2 3.5 - 5.1 mmol/L   Chloride 83 (L) 98 - 111 mmol/L   CO2 23 22 - 32 mmol/L   Glucose, Bld 162 (H) 70 - 99 mg/dL    Comment: Glucose reference range applies only to samples taken after fasting for at least 8 hours.   BUN 9 8 - 23 mg/dL   Creatinine, Ser 0.57 0.44 - 1.00 mg/dL   Calcium 8.7 (L) 8.9 - 10.3 mg/dL   GFR calc non Af Amer >60 >60 mL/min   GFR calc Af Amer >60 >60 mL/min   Anion gap 12 5 - 15    Comment: Performed at [facility] [address] [place], [place] [address]  CBC     Status: Abnormal   Collection Time: 02/09/2020  2:11 PM  Result Value Ref Range   WBC 11.2 (H) 4.0 - 10.5 K/uL   RBC 3.77 (L) 3.87 - 5.11 MIL/uL   Hemoglobin 10.0 (L) 12.0 - 15.0 g/dL   HCT 30.4 (L) 36.0 - 46.0 %   MCV 80.6 80.0 - 100.0 fL   MCH 26.5 26.0 - 34.0 pg  MCHC 32.9 30.0 - 36.0 g/dL   RDW 13.1 11.5 - 15.5 %   Platelets 441 (H) 150 - 400 K/uL   nRBC 0.0 0.0 - 0.2 %    Comment: Performed at [facility], [place] [address], [place], [place] [address]  Respiratory Panel by RT PCR (Flu A&B, Covid) - Nasopharyngeal Swab     Status: None   Collection Time: 02/29/2020  2:39 PM   Specimen: Nasopharyngeal Swab  Result Value Ref Range   SARS Coronavirus 2 by RT PCR NEGATIVE NEGATIVE    Comment: (NOTE) SARS-CoV-2 target nucleic acids are NOT  DETECTED. The SARS-CoV-2 RNA is generally detectable in upper respiratoy specimens during the acute phase of infection. The lowest concentration of SARS-CoV-2 viral copies this assay can detect is 131 copies/mL. A negative result does not preclude SARS-Cov-2 infection and should not be used as the sole basis for treatment or other patient management decisions. A negative result may occur with  improper specimen collection/handling, submission of specimen other than nasopharyngeal swab, presence of viral mutation(s) within the areas targeted by this assay, and inadequate number of viral copies (<131 copies/mL). A negative result must be combined with clinical observations, patient history, and epidemiological information. The expected result is Negative. Fact Sheet for Patients:  PinkCheek.be Fact Sheet for Healthcare Providers:  GravelBags.it This test is not yet ap proved or cleared by the [place] FDA and  has been authorized for detection and/or diagnosis of SARS-CoV-2 by FDA under an Emergency Use Authorization (EUA). This EUA will remain  in effect (meaning this test can be used) for the duration of the COVID-19 declaration under Section 564(b)(1) of the Act, 21 U.S.C. section 360bbb-3(b)(1), unless the authorization is terminated or revoked sooner.    Influenza A by PCR NEGATIVE NEGATIVE   Influenza B by PCR NEGATIVE NEGATIVE    Comment: (NOTE) The Xpert Xpress SARS-CoV-2/FLU/RSV assay is intended as an aid in  the diagnosis of influenza from Nasopharyngeal swab specimens and  should not be used as a sole basis for treatment. Nasal washings and  aspirates are unacceptable for Xpert Xpress SARS-CoV-2/FLU/RSV  testing. Fact Sheet for Patients: PinkCheek.be Fact Sheet for Healthcare Providers: GravelBags.it This test is not yet approved or cleared by the  [place] FDA and  has been authorized for detection and/or diagnosis of SARS-CoV-2 by  FDA under an Emergency Use Authorization (EUA). This EUA will remain  in effect (meaning this test can be used) for the duration of the  Covid-19 declaration under Section 564(b)(1) of the Act, 21  U.S.C. section 360bbb-3(b)(1), unless the authorization is  terminated or revoked. Performed at [facility], [place] [address] [place], [place] [address]   Hemoglobin A1c     Status: None   Collection Time: 02/26/2020  3:50 PM  Result Value Ref Range   Hgb A1c MFr Bld 5.3 4.8 - 5.6 %    Comment: (NOTE) Pre diabetes:          5.7%-6.4% Diabetes:              >6.4% Glycemic control for   <7.0% adults with diabetes    Mean Plasma Glucose 105.41 mg/dL    Comment: Performed at [facility] [address], [place], [place] [address]  CBG monitoring, ED     Status: Abnormal   Collection Time: 03/02/2020  4:16 PM  Result Value Ref Range   Glucose-Capillary 130 (H) 70 - 99 mg/dL  Comment: Glucose reference range applies only to samples taken after fasting for at least 8 hours.    EEG  Result Date: 02/29/2020 Lora Havens, MD     02/22/2020 12:38 PM Patient Name: Beverly Jimenez MRN: [id number] Epilepsy Attending: Lora Havens Referring Physician/Provider: Etta Quill, PA Date: 02/05/2020 Duration: 24.59 minutes Patient history: 86 year old female presented with altered mental status.  On exam was noted to have right gaze preference.  EEG to evaluate for seizures. Level of alertness: Lethargic AEDs during EEG study: None Technical aspects: This EEG study was done with scalp electrodes positioned according to the 10-20 International system of electrode placement. Electrical activity was acquired at a sampling rate of 500Hz  and reviewed with a high frequency filter of 70Hz  and a low frequency filter of 1Hz . EEG data were recorded continuously and digitally stored. Description: No clear posterior  dominant rhythm was seen.  EEG showed continuous generalized 3 to 6 Hz theta-delta slowing.  Hyperventilation and photic stimulation were not performed due to altered mental status. Abnormality - Continuous slow, generalized IMPRESSION: This study is suggestive of moderate diffuse encephalopathy, nonspecific to etiology. No seizures or epileptiform discharges were seen throughout the recording. Dr. Lorraine Lax was notified. Lora Havens   DG Chest Portable 1 View  Result Date: 02/27/2020 CLINICAL DATA:  Endotracheal placement.  Stroke presentation. EXAM: PORTABLE CHEST 1 VIEW COMPARISON:  Earlier same day FINDINGS: Endotracheal tube tip 2 cm above the carina. Orogastric or nasogastric tube enters the stomach with its tip in the fundus. Heart size is normal. Aortic atherosclerosis. Patchy bilateral pulmonary densities could represent developing pneumonia, possibly aspiration. No dense consolidation or collapse. IMPRESSION: Endotracheal tube and gastric tube well positioned. Developing patchy bilateral pulmonary infiltrates could be pneumonia, possibly aspiration. Electronically Signed   By: Nelson Chimes M.D.   On: 02/14/2020 16:03   DG Chest Port 1 View  Result Date: 02/05/2020 CLINICAL DATA:  Altered mental status EXAM: PORTABLE CHEST 1 VIEW COMPARISON:  01/16/2017 FINDINGS: Heart is upper limits normal in size. Bibasilar atelectasis. No effusions or acute bony abnormality. IMPRESSION: Bibasilar atelectasis. Electronically Signed   By: Rolm Baptise M.D.   On: 02/18/2020 10:02   CT HEAD CODE STROKE WO CONTRAST  Result Date: 03/04/2020 CLINICAL DATA:  Code stroke. Neuro deficit acute. Rule out stroke. Altered mental status. EXAM: CT HEAD WITHOUT CONTRAST TECHNIQUE: Contiguous axial images were obtained from the base of the skull through the vertex without intravenous contrast. COMPARISON:  MRI head 03/30/2005 FINDINGS: Brain: Progressive enlargement of the third and lateral ventricles with rounded frontal horns  and rounded third ventricle suggesting obstruction. Mild dilatation of the proximal aqueduct. Fourth ventricle normal in size. Findings suggest obstructive hydrocephalus. Mild atrophy. Chronic microvascular ischemic change in the white matter. No acute infarct, hemorrhage, mass. Vascular: Negative for hyperdense vessel Skull: Negative Sinuses/Orbits: Paranasal sinuses clear. Prior sinus surgery involving the left maxillary sinus. Bilateral cataract extraction. Other: None ASPECTS ([place] Stroke Program Early CT Score) - Ganglionic level infarction (caudate, lentiform nuclei, internal capsule, insula, M1-M3 cortex): 7 - Supraganglionic infarction (M4-M6 cortex): 3 Total score (0-10 with 10 being normal): 10 IMPRESSION: 1. No intracranial hemorrhage or acute infarct 2. Mild to moderate hydrocephalus has developed since 2006. No interval studies to determine chronicity. 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 3. ASPECTS is 10 4. These results were called by telephone at the time of interpretation on 02/05/2020 at 10:28 am to provider Aroor, who verbally acknowledged these results. Electronically Signed  By: Franchot Gallo M.D.   On: 02/19/2020 10:29    Review of Systems  Unable to perform ROS: Acuity of condition   Blood pressure (!) 149/72, pulse 95, temperature 98.9 F (37.2 C), temperature source Rectal, resp. rate (!) 28, height 5\' 4"  (1.626 m), SpO2 97 %. Physical Exam  Constitutional: She appears well-developed and well-nourished. She appears lethargic. She has a sickly appearance.  HENT:  Head: Normocephalic and atraumatic.  Eyes: Pupils are equal, round, and reactive to light.  Right gaze preference  Cardiovascular: Regular rhythm.  Tachycardic at time of assessment  Respiratory: She is in respiratory distress.  Musculoskeletal:        General: Normal range of motion.     Cervical back: Normal range of motion and neck supple.  Neurological: She appears lethargic. GCS  eye subscore is 4. GCS verbal subscore is 2. GCS motor subscore is 4.  Skin: Skin is warm and dry.     Psychiatric: She is noncommunicative.    Assessment/Plan: Ms. Haver is one week status post L2-3 posterior fusion by Dr. Annette Stable. She appears to have developed a metabolic encephalopathy since her discharge on 01/30/2020. She is being admitted to the ICU due to her critically low sodium and concern for airway protection. Her wound has been reinforced with a 3-0 Prolene running suture. Neurosurgery will continue to follow and monitor wound for further signs of CSF leak.  Patricia Nettle 03/03/2020, 4:43 PM

## 2020-02-06 NOTE — ED Triage Notes (Signed)
Pt here from [facility] for ams. Was last normal yesterday at 1500, is normally alert, talking, and cracking jokes, but just groaning today. Pt leaning to right but due to neck pain. Not normally on O2 but placed on O2 by the facility. Lumbar fusion surgery last month, per EMS facility changing dressing on their arrival and it did not appear that it was healing well.

## 2020-02-06 NOTE — Consult Note (Signed)
Consult Note    Beverly Jimenez [id number] DOB: 26-Jul-1925 DOA: 03/04/2020  PCP: Marton Redwood, MD Consultants:  Pool - neurosurgery Patient coming from: [facility]; NOK: Guido Sander, [phone]; Kimberlee Nearing, [phone]  Chief Complaint: AMS  HPI: Beverly Jimenez is a 86 y.o. female with medical history significant of HTN;  HLD; and recent (2/23-25) hospitalization for L2-3 posterior lumbar fusion with subsequent d/c to SNF.  She is presenting with AMS since yesterday.  She did ok with the surgery and was in pain up to the day of d/c.  She was given Valium and oxycodone for pain.  Before that hospitalization, she was living home alone and was ambulating with a walker, cooked her own food.  She was complaining of not getting medications while in rehab, primarily her Advair and Nexium.  He had a conversation with her yesterday and she had been started on oxygen.  He tried to call the nursing staff and wasn't able to reach the nursing staff.  Eventually, they were told that during the PT she appeared to need O2.  From yesterday to today, they were told that her back was draining from the surgery and they sent her over for that.  The pain meds seemed to slow her speech and her cognitive skills were diminished.  Her significantly AMS happened overnight or this morning.  He thinks she has only had 1 BM since last Wednesday.  She has appeared to have abdominal pain while in the ER.  No fever.    ED Course:  Recovering from lumbar surgery in SNF rehab with gradual MS decline since yesterday.  Polypharmacy vs. Metabolic.  With gaze preference, consulted neurology.  Hyponatremia, sepsis, does not appear to be Code Stroke.  If not getting better with other treatments, consider MRI (per Dr. Lorraine Lax).  Na++ 116.  Review of Systems: Unable to perform  Ambulatory Status:  Ambulated with a walker prior to last hospital admission  Past Medical History:  Diagnosis Date  . Anemia   . Anxiety   .  Arthritis    shoulder and knee pain  . Asthma   . Constipation   . Depression    Hx; of situational depression  . GERD (gastroesophageal reflux disease)   . Glaucoma    Hx; of  . High cholesterol   . History of hiatal hernia   . Hypertension   . Lumbar stenosis   . Myalgia   . Numbness and tingling    Hx: of B/LLE  . Osteoporosis   . Pneumonia    Hx: of "years ago"  . Shortness of breath   . Wears hearing aid    B/L    Past Surgical History:  Procedure Laterality Date  . APPENDECTOMY    . BACK SURGERY  01/2020   L2-4 fusion  . CHOLECYSTECTOMY    . COLONOSCOPY     Hx: of  . COLONOSCOPY WITH PROPOFOL N/A 09/18/2017   Procedure: COLONOSCOPY WITH PROPOFOL;  Surgeon: Wilford Corner, MD;  Location: [place];  Service: Endoscopy;  Laterality: N/A;  . EYE SURGERY     catarac  . 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 HERNIA REPAIR    . LUMBAR LAMINECTOMY/DECOMPRESSION MICRODISCECTOMY Left 07/05/2013   Procedure: LUMBAR LAMINECTOMY/DECOMPRESSION MICRODISCECTOMY 1 LEVEL Left Lumbar five-Sacral one;  Surgeon: Charlie Pitter, MD;  Location: [place] [facility];  Service: Neurosurgery;  Laterality: Left;  Left Lumbar Five-Sacral One Microdiskectomy  . MULTIPLE TOOTH EXTRACTIONS    .  VASCULAR SURGERY     varicose vein surgery    Social History   Socioeconomic History  . Marital status: Widowed    Spouse name: Not on file  . Number of children: Not on file  . Years of education: Not on file  . Highest education level: Not on file  Occupational History  . Not on file  Tobacco Use  . Smoking status: Never Smoker  . Smokeless tobacco: Never Used  Substance and Sexual Activity  . Alcohol use: No  . Drug use: No  . Sexual activity: Not on file  Other Topics Concern  . Not on file  Social History Narrative  . Not on file   Social Determinants of Health   Financial Resource Strain:   . Difficulty of Paying Living Expenses: Not on file  Food Insecurity:   . Worried  About Charity fundraiser in the Last Year: Not on file  . Ran Out of Food in the Last Year: Not on file  Transportation Needs:   . Lack of Transportation (Medical): Not on file  . Lack of Transportation (Non-Medical): Not on file  Physical Activity:   . Days of Exercise per Week: Not on file  . Minutes of Exercise per Session: Not on file  Stress:   . Feeling of Stress : Not on file  Social Connections:   . Frequency of Communication with Friends and Family: Not on file  . Frequency of Social Gatherings with Friends and Family: Not on file  . Attends Religious Services: Not on file  . Active Member of Clubs or Organizations: Not on file  . Attends Archivist Meetings: Not on file  . Marital Status: Not on file  Intimate Partner Violence:   . Fear of Current or Ex-Partner: Not on file  . Emotionally Abused: Not on file  . Physically Abused: Not on file  . Sexually Abused: Not on file    Allergies  Allergen Reactions  . Amoxicillin Hives, Itching, Rash and Other (See Comments)    Has patient had a PCN reaction causing immediate rash, facial/tongue/throat swelling, SOB or lightheadedness with hypotension: Unknown Has patient had a PCN reaction causing SEVERE rash involving mucus membranes or skin necrosis:    #  #  YES  #  #  Has patient had a PCN reaction that required hospitalization: No Has patient had a PCN reaction occurring within the last 10 years: No    . Macrodantin [Nitrofurantoin Macrocrystal] Shortness Of Breath  . Penicillins Other (See Comments)    SEE AMOXICILLIN  . Sulfa Antibiotics Shortness Of Breath and Rash  . Cymbalta [Duloxetine Hcl]     UNSPECIFIED REACTION     Family History  Problem Relation Age of Onset  . Varicose Veins Mother   . Diabetes Brother   . Heart disease Brother   . Hypertension Brother   . Varicose Veins Brother   . Hypertension Other   . Heart disease Other   . Glaucoma Other   . Diabetes Other   . Varicose Veins  Sister   . Varicose Veins Daughter     Prior to Admission medications   Medication Sig Start Date End Date Taking? Authorizing Provider  acetaminophen (TYLENOL) 500 MG tablet Take 1-2 tablets (500-1,000 mg total) by mouth every 8 (eight) hours as needed for mild pain, moderate pain, fever or headache. Patient taking differently: Take 1,000 mg by mouth 2 (two) times daily as needed for mild pain, moderate pain,  fever or headache.  09/18/17  Yes Wilford Corner, MD  albuterol (PROVENTIL HFA;VENTOLIN HFA) 108 (90 Base) MCG/ACT inhaler Inhale 2 puffs into the lungs every 4 (four) hours as needed for wheezing or shortness of breath. 01/18/17  Yes Ghimire, Henreitta Leber, MD  ALPRAZolam Duanne Moron) 0.5 MG tablet Take 1 tablet (0.5 mg total) by mouth at bedtime. 01/30/20  Yes Viona Gilmore D, NP  Calcium Carbonate-Vit D-Min (CALTRATE PLUS PO) Take 1 tablet by mouth 2 (two) times daily.    Yes [provider]  Chloramphenicol POWD Place 1 application into the left ear every Thursday. Chloramphenicol/Sulfamenth/Amphoteracin    [employer]   Yes [provider]  cholecalciferol (VITAMIN D3) 25 MCG (1000 UNIT) tablet Take 1,000 Units by mouth daily.   Yes [provider]  Cyanocobalamin (VITAMIN B-12 PO) Place 1 tablet under the tongue daily. 582mcg   Yes [provider]  dorzolamide (TRUSOPT) 2 % ophthalmic solution Place 1 drop into both eyes 2 (two) times daily.   Yes [provider]  fish oil-omega-3 fatty acids 1000 MG capsule Take 1 g by mouth 2 (two) times daily.    Yes [provider]  fluticasone (FLONASE) 50 MCG/ACT nasal spray Place 1 spray into both nostrils at bedtime.    Yes [provider]  Fluticasone-Salmeterol (ADVAIR) 100-50 MCG/DOSE AEPB Inhale 1 puff into the lungs 2 (two) times daily.   Yes [provider]  gabapentin (NEURONTIN) 100 MG capsule Take 100 mg by mouth at bedtime. X 2 weeks 02/03/20  Yes [provider]  guaiFENesin (MUCINEX) 600 MG 12 hr tablet Take 600 mg by mouth daily as needed for cough.   Yes [provider]  iron polysaccharides (NIFEREX) 150 MG capsule Take 150 mg by mouth every morning.   Yes [provider]  LATANOPROST OP Place 1 drop into both eyes at bedtime.   Yes [provider]  loratadine (CLARITIN) 10 MG tablet Take 10 mg by mouth daily as needed for allergies.    Yes [provider]  losartan (COZAAR) 50 MG tablet Take 50 mg by mouth daily.   Yes [provider]  niacin 500 MG tablet Take 500 mg by mouth daily with breakfast.   Yes [provider]  omeprazole (PRILOSEC) 20 MG capsule Take 20 mg by mouth daily.   Yes [provider]  Potassium 99 MG TABS Take 99 mg by mouth daily.   Yes [provider]  Probiotic Product (ALIGN PO) Take 1 capsule by mouth every morning.   Yes [provider]  sennosides-docusate sodium (SENOKOT-S) 8.6-50 MG tablet Take 2 tablets by mouth daily.   Yes [provider]  sodium chloride (OCEAN) 0.65 % SOLN nasal spray Place 1 spray into both nostrils as needed for congestion.   Yes [provider]  traMADol (ULTRAM) 50 MG tablet Take 25 mg by mouth 2 (two) times daily as needed for moderate pain.   Yes [provider]  vitamin C (ASCORBIC ACID) 500 MG tablet Take 500 mg by mouth daily.   Yes [provider]  vitamin E 400 UNIT capsule Take 400 Units by mouth daily.   Yes [provider]  diazepam (VALIUM) 5 MG tablet Take 1-2 tablets (5-10 mg total) by mouth every 6 (six) hours as needed for muscle spasms. Patient not taking: Reported on 03/01/2020 01/30/20   Viona Gilmore D, NP  oxyCODONE (OXY IR/ROXICODONE) 5 MG immediate release tablet Take 1-2 tablets (  5-10 mg total) by mouth every 4 (four) hours as needed for severe pain ((score 7 to 10)). Patient not taking: Reported on 02/16/2020 01/30/20   Viona Gilmore D,  NP    Physical Exam: Vitals:   02/23/2020 1200 02/11/2020 1215 02/29/2020 1245 02/09/2020 1300  BP: (!) 215/111 (!) 188/90 (!) 180/96 (!) 196/89  Pulse: (!) 110 (!) 121 (!) 124 (!) 125  Resp: (!) 23 17 (!) 29 (!) 36  Temp:      TempSrc:      SpO2: 98% 94% 93% 94%     . General:  Obtunded/unresponsive, gurgling; getting EEG  . Eyes:  Closed, normal lids . ENT:  Open mouth breathing with normal lips & tongue, mmm . Neck:  no LAD, masses or thyromegaly; vomitus along her R neck/shoulder . Cardiovascular:  RR with tachycardia to 130s.  Marland Kitchen Respiratory:   Coarse upper airway noises, diminished in the B bases.  Persistently increased respiratory effort. . Abdomen:  soft, NT, ND, NABS . Back:   Not examined since she was in the midst of EEG - will need to examine if PCCM is not admitting . Skin:  no rash or induration seen on limited exam . Musculoskeletal:   no bony abnormality . Lower extremity:  Trace LE edema.  Limited foot exam with no ulcerations.  2+ distal pulses. Marland Kitchen Psychiatric:  obtunded . Neurologic:  Unable to perform    Radiological Exams on Admission: EEG  Result Date: 02/17/2020 Lora Havens, MD     02/20/2020 12:38 PM Patient Name: JAMERA JAGO MRN: [id number] Epilepsy Attending: Lora Havens Referring Physician/Provider: Etta Quill, PA Date: 02/25/2020 Duration: 24.59 minutes Patient history: 86 year old female presented with altered mental status.  On exam was noted to have right gaze preference.  EEG to evaluate for seizures. Level of alertness: Lethargic AEDs during EEG study: None Technical aspects: This EEG study was done with scalp electrodes positioned according to the 10-20 International system of electrode placement. Electrical activity was acquired at a sampling rate of 500Hz  and reviewed with a high frequency filter of 70Hz  and a low frequency filter of 1Hz . EEG data were recorded continuously and digitally stored. Description: No clear posterior dominant rhythm was  seen.  EEG showed continuous generalized 3 to 6 Hz theta-delta slowing.  Hyperventilation and photic stimulation were not performed due to altered mental status. Abnormality - Continuous slow, generalized IMPRESSION: This study is suggestive of moderate diffuse encephalopathy, nonspecific to etiology. No seizures or epileptiform discharges were seen throughout the recording. Dr. Lorraine Lax was notified. Lora Havens   DG Chest Port 1 View  Result Date: 02/16/2020 CLINICAL DATA:  Altered mental status EXAM: PORTABLE CHEST 1 VIEW COMPARISON:  01/16/2017 FINDINGS: Heart is upper limits normal in size. Bibasilar atelectasis. No effusions or acute bony abnormality. IMPRESSION: Bibasilar atelectasis. Electronically Signed   By: Rolm Baptise M.D.   On: 02/18/2020 10:02   CT HEAD CODE STROKE WO CONTRAST  Result Date: 03/02/2020 CLINICAL DATA:  Code stroke. Neuro deficit acute. Rule out stroke. Altered mental status. EXAM: CT HEAD WITHOUT CONTRAST TECHNIQUE: Contiguous axial images were obtained from the base of the skull through the vertex without intravenous contrast. COMPARISON:  MRI head 03/30/2005 FINDINGS: Brain: Progressive enlargement of the third and lateral ventricles with rounded frontal horns and rounded third ventricle suggesting obstruction. Mild dilatation of the proximal aqueduct. Fourth ventricle normal in size. Findings suggest obstructive hydrocephalus. Mild atrophy. Chronic microvascular ischemic change in the white matter. No  acute infarct, hemorrhage, mass. Vascular: Negative for hyperdense vessel Skull: Negative Sinuses/Orbits: Paranasal sinuses clear. Prior sinus surgery involving the left maxillary sinus. Bilateral cataract extraction. Other: None ASPECTS ([place] Stroke Program Early CT Score) - Ganglionic level infarction (caudate, lentiform nuclei, internal capsule, insula, M1-M3 cortex): 7 - Supraganglionic infarction (M4-M6 cortex): 3 Total score (0-10 with 10 being normal): 10 IMPRESSION:  1. No intracranial hemorrhage or acute infarct 2. Mild to moderate hydrocephalus has developed since 2006. No interval studies to determine chronicity. 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 3. ASPECTS is 10 4. These results were called by telephone at the time of interpretation on 02/11/2020 at 10:28 am to provider Aroor, who verbally acknowledged these results. Electronically Signed   By: Franchot Gallo M.D.   On: 02/05/2020 10:29    EKG: Independently reviewed.  NSR with rate 94; nonspecific ST changes with no evidence of acute ischemia   Labs on Admission: I have personally reviewed the available labs and imaging studies at the time of the admission.  Pertinent labs:   ABG: 7.539/31.2/189 Na++ 117; 128 on 01/24/20; normal in 2019 Glucose 171 Albumin 3.2 Bili 1.5 Lactate 1.7 WBC 21.4 Hgb 10.3 (improved) INR 1.0 UA: 50 glucose; moderate Hgb; 5 ketones; 30 protein UDS + BZD COVID pending Blood cultures pending   Assessment/Plan Principal Problem:   Acute metabolic encephalopathy Active Problems:   HTN (hypertension)   Acute hyponatremia   Asthma   Degenerative spondylolisthesis   Sepsis due to undetermined organism with acute respiratory failure (HCC)   Acute metabolic encephalopathy -Patient presenting with encephalopathy as evidenced by her obtunded/unresponsiveness -Her usual baseline mental status is pleasant and chatty -She may have had a slow decline since her recent hospitalization and placement in SNF, but she had abrupt worsening yesterday and even moreso this AM -Evaluation with severe hyponatremia -Also with apparent sepsis (see below) -There may be a component of medication misadventure (newly added Neurontin, although this appears less likely based on my current exam -Code stroke was initially called; CT was negative for CVA but did show mild to moderate obstructive hydrocephalus of uncertain duration -EEG is being done now -Based on  current concern for inability to protect her airway, will request PCCM consultation for admission there -If the patient does not meet ICU criteria, will then plan to admit to progressive care unit -Based on her current presentation, the patient appears to have a poor prognosis and this has been explained to her son -I confirmed with her son that they would want full support including CPR and mechanical ventilation should her condition continue to decline.  Sepsis -SIRS criteria in this patient includes: Leukocytosis, tachycardia, tachypnea, hypoxia  -Patient has evidence of acute organ failure with encephalopathy that is not easily explained by another condition. -While awaiting blood cultures, this appears to be a preseptic condition. -Sepsis protocol initiated -Suspected source is possibly related to her recent back surgery and needs further evaluation -Blood and urine cultures pending -Given the apparent severity of her illness, she will be admitted to PCCM at this time  Acute hyponatremia -Etiology appears to be hypovolemic hyponatremia; patient with probably acute infection/sepsis -Given her significant hyponatremia in conjunction with AMS, she may benefit from hot salts (hypertonic saline) - will defer to PCCM  Recent lumbar spine fusion -She does not appear to have been ambulatory since her recent surgery, other than transferring to bedside chair (according to her son) -She may have had additional pain medications added to her  regimen -Her recent surgery may be a source of infection -Defer to PCCM  Asthma with acute respiratory failure -Her son is concerned that she has not been receiving her Advair at the facility and that this may have led to her issues -She currently has upper airway noise and does not appear to be effectively protecting her airway and so is at high risk for intubation -Will defer to PCCM at this point  Hypertension -Takes Cozaar monotherapy but currently  quite hypertensive (nearing emergency given her AMS?) -Likely needs hydralazine vs a drip - will defer to PCCM   Total critical care time: 65 minutes Critical care time was exclusive of separately billable procedures and treating other patients. Critical care was necessary to treat or prevent imminent or life-threatening deterioration. Critical care was time spent personally by me on the following activities: development of treatment plan with patient and/or surrogate as well as nursing, discussions with consultants, evaluation of patient's response to treatment, examination of patient, obtaining history from patient or surrogate, ordering and performing treatments and interventions, ordering and review of laboratory studies, ordering and review of radiographic studies, pulse oximetry and re-evaluation of patient's condition.     Note: This patient has been tested and is pending for the novel coronavirus COVID-19.   Thank you for this interesting consult.  The patient is being admitted to the ICU at this time.  If the patient stabilizes and is ready for transfer to [facility], we will be happy to assume care at that time.   Karmen Bongo MD [facility]   How to contact the [facility] Attending or Consulting provider [place] or covering provider during after hours [place], for this patient?  1. Check the care team in [facility] and look for a) attending/consulting [facility] provider listed and b) the [facility] team listed 2. Log into [email or web address] and use [place] universal password to access. If you do not have the password, please contact the hospital operator. 3. Locate the [facility] provider you are looking for under [facility] and page to a number that you can be directly reached. 4. If you still have difficulty reaching the provider, please page the [facility] (Director on Call) for the Hospitalists listed on amion for assistance.   02/26/2020, 1:14 PM

## 2020-02-06 NOTE — Consult Note (Signed)
Neurology Consultation  Reason for Consult: Code stroke/altered mental status Referring Physician: Sedonia Small  CC: Altered mental status/code stroke  History is obtained from: Nursing home/EMS  HPI: Beverly Jimenez is a 86 y.o. female with past medical history of myalgia, hypercholesterolemia, hypertension and posterior interbody lumbar fusion.  Patient was residing at [facility].  She was brought to the hospital via EMS secondary to altered mental status.  Due to altered mental status code stroke was initiated.  In discussion with the nurse who took care of her today, she states that yesterday at 1500 hrs. patient was baseline.  At 11 PM it was noted that the patient was starting to gag on her pills, have increased drainage from her incision site and was asking for pain medication.  This morning at 730 patient was noted to only be moaning, not following commands.  During evaluation with CT i-STAT came back with sodium of 116 and white blood cell count of 21.  ED course  Relevant labs include -sodium 116, BUN 9, creatinine 0.50, hemoglobin 10.5, hematocrit 31,  CT head shows-no intracranial hemorrhage or acute infarct.  Mild to moderate hydrocephalus has developed since 2006.  No interval study to determine chronicity.  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  Chart review: No neurological charts on epic  Work up that has been done: CT head, i-STAT  LKW: 1500 hrs. on 02/05/2020 tpa given?: no, out of window Premorbid modified Rankin scale (mRS): 4 NIH stroke scale: 21   Past Medical History:  Diagnosis Date  . Anemia   . Anxiety   . Arthritis    shoulder and knee pain  . Asthma   . Asthma   . Constipation   . Depression    Hx; of situational depression  . GERD (gastroesophageal reflux disease)   . Glaucoma    Hx; of  . High cholesterol   . History of hiatal hernia   . HOH (hard of hearing)   . Hypertension   . Lumbar stenosis   . Myalgia   .  Numbness and tingling    Hx: of B/LLE  . Osteoporosis   . Pneumonia    Hx: of "years ago"  . Shortness of breath   . Wears hearing aid    B/L    Family History  Problem Relation Age of Onset  . Varicose Veins Mother   . Diabetes Brother   . Heart disease Brother   . Hypertension Brother   . Varicose Veins Brother   . Hypertension Other   . Heart disease Other   . Glaucoma Other   . Diabetes Other   . Varicose Veins Sister   . Varicose Veins Daughter     Social History:   reports that she has never smoked. She has never used smokeless tobacco. She reports that she does not drink alcohol or use drugs.  Medications  Current Facility-Administered Medications:  .  sodium chloride 0.9 % bolus 1,000 mL, 1,000 mL, Intravenous, Once, Bero, Barth Kirks, MD  Current Outpatient Medications:  .  acetaminophen (TYLENOL) 500 MG tablet, Take 1-2 tablets (500-1,000 mg total) by mouth every 8 (eight) hours as needed for mild pain, moderate pain, fever or headache., Disp: 30 tablet, Rfl: 0 .  albuterol (PROVENTIL HFA;VENTOLIN HFA) 108 (90 Base) MCG/ACT inhaler, Inhale 2 puffs into the lungs every 4 (four) hours as needed for wheezing or shortness of breath., Disp: 18 g, Rfl: 0 .  ALPRAZolam (XANAX) 0.5 MG  tablet, Take 1 tablet (0.5 mg total) by mouth at bedtime., Disp: 7 tablet, Rfl: 0 .  bimatoprost (LUMIGAN) 0.01 % SOLN, Place 1 drop into both eyes at bedtime., Disp: , Rfl:  .  Calcium Carbonate-Vit D-Min (CALTRATE PLUS PO), Take 1 tablet by mouth 2 (two) times daily. , Disp: , Rfl:  .  Chloramphenicol POWD, Place 1 application into the left ear every Thursday. Chloramphenicol/Sulfamenth/Amphoteracin    [employer], Disp: , Rfl:  .  cholecalciferol (VITAMIN D3) 25 MCG (1000 UNIT) tablet, Take 1,000 Units by mouth daily., Disp: , Rfl:  .  Cyanocobalamin (VITAMIN B-12 PO), Place 1 tablet under the tongue daily., Disp: , Rfl:  .  diazepam (VALIUM) 5 MG tablet, Take 1-2 tablets (5-10 mg  total) by mouth every 6 (six) hours as needed for muscle spasms., Disp: 30 tablet, Rfl: 0 .  docusate sodium (COLACE) 100 MG capsule, Take 100 mg by mouth daily., Disp: , Rfl:  .  dorzolamide (TRUSOPT) 2 % ophthalmic solution, Place 1 drop into both eyes 2 (two) times daily., Disp: , Rfl:  .  esomeprazole (NEXIUM) 40 MG capsule, Take 40 mg by mouth daily at 12 noon., Disp: , Rfl:  .  fish oil-omega-3 fatty acids 1000 MG capsule, Take 1 g by mouth 2 (two) times daily. , Disp: , Rfl:  .  fluticasone (FLONASE) 50 MCG/ACT nasal spray, Place 1 spray into both nostrils at bedtime. , Disp: , Rfl:  .  Fluticasone-Salmeterol (ADVAIR) 100-50 MCG/DOSE AEPB, Inhale 1 puff into the lungs 2 (two) times daily. , Disp: , Rfl:  .  guaiFENesin (MUCINEX) 600 MG 12 hr tablet, Take 600 mg by mouth daily as needed for cough., Disp: , Rfl:  .  iron polysaccharides (NIFEREX) 150 MG capsule, Take 150 mg by mouth every morning., Disp: , Rfl:  .  loratadine (CLARITIN) 10 MG tablet, Take 10 mg by mouth daily as needed for allergies. , Disp: , Rfl:  .  niacin 500 MG tablet, Take 500 mg by mouth daily with breakfast., Disp: , Rfl:  .  olmesartan (BENICAR) 20 MG tablet, Take 20 mg by mouth daily., Disp: , Rfl:  .  oxyCODONE (OXY IR/ROXICODONE) 5 MG immediate release tablet, Take 1-2 tablets (5-10 mg total) by mouth every 4 (four) hours as needed for severe pain ((score 7 to 10))., Disp: 60 tablet, Rfl: 0 .  Potassium 99 MG TABS, Take 99 mg by mouth daily., Disp: , Rfl:  .  Probiotic Product (ALIGN PO), Take 1 capsule by mouth every morning., Disp: , Rfl:  .  sodium chloride (OCEAN) 0.65 % SOLN nasal spray, Place 1 spray into both nostrils as needed for congestion., Disp: , Rfl:  .  vitamin C (ASCORBIC ACID) 500 MG tablet, Take 500 mg by mouth daily., Disp: , Rfl:  .  vitamin E 400 UNIT capsule, Take 400 Units by mouth daily., Disp: , Rfl:   ROS:  Unable to obtain due to altered mental status.   Exam: Current vital signs: BP  125/87   Pulse 96   Temp 98.9 F (37.2 C) (Rectal)   Resp (!) 27   LMP  (LMP Unknown)   SpO2 100%  Vital signs in last 24 hours: Temp:  [98.2 F (36.8 C)-98.9 F (37.2 C)] 98.9 F (37.2 C) (03/04 0929) Pulse Rate:  [96-115] 96 (03/04 1000) Resp:  [24-28] 27 (03/04 1000) BP: (125-194)/(87-101) 125/87 (03/04 1000) SpO2:  [69 %-100 %] 100 % (03/04 1000)  Constitutional: Appears well-developed and well-nourished.  Psych: Affect appropriate to situation Eyes: No scleral injection HENT: No OP obstrucion Head: Normocephalic.  Cardiovascular: Normal rate and regular rhythm.  Respiratory: Effort normal, non-labored breathing GI: Soft.  No distension. There is no tenderness.  Skin: WDI  Neuro: Mental Status: Patient is awake, alert, moaning and not following commands Speech-moaning Cranial Nerves: II: Blinks to threat bilaterally III,IV, VI: Doll's intact. Pupils equal, round and reactive to light V: Unable to obtain secondary to altered mental status VII: Facial movement is symmetric.  VIII: Does not follow commands  Motor: 2/5 in all 4 extremities Positive drift allowing both upper extremity lower extremities to drop to bed Sensory: Intact to noxious stimuli in all 4 extremities Deep Tendon Reflexes: 1+ brachioradialis bilaterally 0 knee jerks bilaterally Plantars: Mute bilaterally Cerebellar: Unable to obtain  Labs I have reviewed labs in epic and the results pertinent to this consultation are:   CBC    Component Value Date/Time   WBC 21.4 (H) 02/03/2020 0940   RBC 3.81 (L) 02/04/2020 0940   HGB 11.2 (L) 02/27/2020 1019   HCT 33.0 (L) 03/04/2020 1019   PLT 502 (H) 02/05/2020 0940   MCV 80.8 02/09/2020 0940   MCH 27.0 02/26/2020 0940   MCHC 33.4 02/15/2020 0940   RDW 13.0 02/26/2020 0940   LYMPHSABS 0.4 (L) 02/09/2020 1009   MONOABS 0.8 03/02/2020 1009   EOSABS 0.0 02/16/2020 1009   BASOSABS 0.0 02/17/2020 1009    CMP     Component Value Date/Time    NA 116 (LL) 02/05/2020 1019   K 3.4 (L) 02/28/2020 1019   CL 81 (L) 02/07/2020 0940   CO2 24 02/09/2020 0940   GLUCOSE 171 (H) 03/01/2020 0940   BUN 10 02/23/2020 0940   CREATININE 0.55 02/25/2020 0940   CALCIUM 9.4 03/01/2020 0940   PROT 7.0 02/11/2020 0940   ALBUMIN 3.2 (L) 03/01/2020 0940   AST 27 02/13/2020 0940   ALT 41 02/27/2020 0940   ALKPHOS 63 02/16/2020 0940   BILITOT 1.5 (H) 02/11/2020 0940   GFRNONAA >60 02/19/2020 0940   GFRAA >60 02/04/2020 0940    Lipid Panel  No results found for: CHOL, TRIG, HDL, CHOLHDL, VLDL, LDLCALC, LDLDIRECT   Imaging I have reviewed the images obtained:  CT-scan of the brain-no intracranial hemorrhage or acute infarct.  Mild to moderate hydrocephalus has developed since 2006.  No interval study to determine chronicity.  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  Etta Quill PA-C [facility] [phone]  M-F  (9:00 am- 5:00 PM)  02/11/2020, 10:42 AM     Assessment:  86 year old female brought to the hospital secondary to altered mental status.  Due to altered mental status and question of stroke code stroke was initiated.  On exam patient was altered with nonfocal neurological exam.  As noted above sodium was noted to be 116 white blood cell was noted to be 21.  I believe this is most likely toxic metabolic encephalopathy rather than a stroke.  We will also obtain EEG to evaluate for possible seizure as she does have significantly low sodium.  Impression: -Toxic/metabolic encephalopathy --Hyponatremia  -- Leukocytosis  Recommendations: -Address metabolic and infectious processes -Obtain EEG --If patient does not return to baseline despite treatment of metabolic conditions can get MRI brain.  NEUROHOSPITALIST ADDENDUM Performed a face to face diagnostic evaluation.   I have reviewed the contents of history and physical exam as documented by PA/ARNP/Resident. I have discussed and  formulated the  above plan as documented.  86 year old female with recent spinal surgery presents to the ED after progressive lethargy and confusion. Last known normal 3 PM, last p.m. patient noted to start gagging on her pills.  This morning patient was moaning not following commands.  Patient noted to have right gaze preference, not forced gaze deviation, but was not crossing midline.  She was moving bilateral upper extremities equally.  Code stroke initiated-stat CT head obtained showed no hemorrhage, aspects 10.  Also noted to have leukocytosis of 20, sodium 116.  Felt encephalopathy likely from underlying metabolic issues and less likely acute stroke.  Patient also not a candidate for TPA.  Clinically does not appear to be LVO  Karena Addison Ophia Shamoon MD [facility] [phone]   If 7pm to 7am, please call on call as listed on AMION.

## 2020-02-06 NOTE — ED Provider Notes (Addendum)
[facility] Emergency Department Provider Note MRN:  [id number]  Arrival date & time: 02/05/2020     Chief Complaint   Altered Mental Status   History of Present Illness   Beverly Jimenez is a 86 y.o. year-old female with a history of lumbar stenosis presenting to the ED with chief complaint of altered mental status.  Not acting normally since yesterday afternoon, today he is not communicating normally, not following commands.  Family reports that facility has been giving her a number of new medications such as gabapentin, Xanax.  I was unable to obtain an accurate HPI, PMH, or ROS due to the patient's altered mental status.  Level 5 caveat.  Review of Systems  Positive for altered mental status.  Patient's Health History    Past Medical History:  Diagnosis Date  . Anemia   . Anxiety   . Arthritis    shoulder and knee pain  . Asthma   . Constipation   . Depression    Hx; of situational depression  . GERD (gastroesophageal reflux disease)   . Glaucoma    Hx; of  . High cholesterol   . History of hiatal hernia   . Hypertension   . Lumbar stenosis   . Myalgia   . Numbness and tingling    Hx: of B/LLE  . Osteoporosis   . Pneumonia    Hx: of "years ago"  . Shortness of breath   . Wears hearing aid    B/L    Past Surgical History:  Procedure Laterality Date  . APPENDECTOMY    . BACK SURGERY  01/2020   L2-4 fusion  . CHOLECYSTECTOMY    . COLONOSCOPY     Hx: of  . COLONOSCOPY WITH PROPOFOL N/A 09/18/2017   Procedure: COLONOSCOPY WITH PROPOFOL;  Surgeon: Wilford Corner, MD;  Location: [facility];  Service: Endoscopy;  Laterality: N/A;  . EYE SURGERY     catarac  . 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 HERNIA REPAIR    . LUMBAR LAMINECTOMY/DECOMPRESSION MICRODISCECTOMY Left 07/05/2013   Procedure: LUMBAR LAMINECTOMY/DECOMPRESSION MICRODISCECTOMY 1 LEVEL Left Lumbar five-Sacral one;  Surgeon: Charlie Pitter, MD;  Location: [facility];  Service: Neurosurgery;  Laterality: Left;  Left Lumbar Five-Sacral One Microdiskectomy  . MULTIPLE TOOTH EXTRACTIONS    . VASCULAR SURGERY     varicose vein surgery    Family History  Problem Relation Age of Onset  . Varicose Veins Mother   . Diabetes Brother   . Heart disease Brother   . Hypertension Brother   . Varicose Veins Brother   . Hypertension Other   . Heart disease Other   . Glaucoma Other   . Diabetes Other   . Varicose Veins Sister   . Varicose Veins Daughter     Social History   Socioeconomic History  . Marital status: Widowed    Spouse name: Not on file  . Number of children: Not on file  . Years of education: Not on file  . Highest education level: Not on file  Occupational History  . Not on file  Tobacco Use  . Smoking status: Never Smoker  . Smokeless tobacco: Never Used  Substance and Sexual Activity  . Alcohol use: No  . Drug use: No  . Sexual activity: Not on file  Other Topics Concern  . Not on file  Social History Narrative  . Not on file   Social Determinants of Health  Financial Resource Strain:   . Difficulty of Paying Living Expenses: Not on file  Food Insecurity:   . Worried About Charity fundraiser in the Last Year: Not on file  . Ran Out of Food in the Last Year: Not on file  Transportation Needs:   . Lack of Transportation (Medical): Not on file  . Lack of Transportation (Non-Medical): Not on file  Physical Activity:   . Days of Exercise per Week: Not on file  . Minutes of Exercise per Session: Not on file  Stress:   . Feeling of Stress : Not on file  Social Connections:   . Frequency of Communication with Friends and Family: Not on file  . Frequency of Social Gatherings with Friends and Family: Not on file  . Attends Religious Services: Not on file  . Active Member of Clubs or Organizations: Not on file  . Attends Archivist Meetings: Not on file  . Marital Status: Not on file  Intimate Partner Violence:    . Fear of Current or Ex-Partner: Not on file  . Emotionally Abused: Not on file  . Physically Abused: Not on file  . Sexually Abused: Not on file     Physical Exam   Vitals:   02/23/2020 1200 02/25/2020 1215  BP: (!) 215/111 (!) 188/90  Pulse: (!) 110 (!) 121  Resp: (!) 23 17  Temp:    SpO2: 98% 94%    CONSTITUTIONAL: Chronically ill-appearing, NAD NEURO: Somnolent, moves all extremities, groaning, not following commands EYES:  eyes equal and reactive ENT/NECK:  no LAD, no JVD CARDIO: Regular rate, well-perfused, normal S1 and S2 PULM:  CTAB no wheezing or rhonchi GI/GU:  normal bowel sounds, non-distended, non-tender MSK/SPINE:  No gross deformities, no edema SKIN:  no rash, atraumatic PSYCH:  Appropriate speech and behavior  *Additional and/or pertinent findings included in MDM below  Diagnostic and Interventional Summary    EKG Interpretation  Date/Time:  Thursday February 06 2020 09:39:58 EST Ventricular Rate:  94 PR Interval:    QRS Duration: 81 QT Interval:  363 QTC Calculation: 454 R Axis:   57 Text Interpretation: Sinus rhythm Atrial premature complexes Consider right atrial enlargement Abnormal R-wave progression, early transition Confirmed by Gerlene Fee [phone]) on 02/23/2020 11:17:14 AM      Cardiac Monitoring Interpretation:  Labs Reviewed  CBC - Abnormal; Notable for the following components:      Result Value   WBC 21.4 (*)    RBC 3.81 (*)    Hemoglobin 10.3 (*)    HCT 30.8 (*)    Platelets 502 (*)    All other components within normal limits  COMPREHENSIVE METABOLIC PANEL - Abnormal; Notable for the following components:   Sodium 117 (*)    Chloride 81 (*)    Glucose, Bld 171 (*)    Albumin 3.2 (*)    Total Bilirubin 1.5 (*)    All other components within normal limits  URINALYSIS, ROUTINE W REFLEX MICROSCOPIC - Abnormal; Notable for the following components:   APPearance CLOUDY (*)    Glucose, UA 50 (*)    Hgb urine dipstick MODERATE (*)     Ketones, ur 5 (*)    Protein, ur 30 (*)    Bacteria, UA FEW (*)    All other components within normal limits  DIFFERENTIAL - Abnormal; Notable for the following components:   Neutro Abs 19.8 (*)    Lymphs Abs 0.4 (*)    Abs Immature Granulocytes 0.12 (*)  All other components within normal limits  RAPID URINE DRUG SCREEN, HOSP PERFORMED - Abnormal; Notable for the following components:   Benzodiazepines POSITIVE (*)    All other components within normal limits  CBG MONITORING, ED - Abnormal; Notable for the following components:   Glucose-Capillary 176 (*)    All other components within normal limits  I-STAT CHEM 8, ED - Abnormal; Notable for the following components:   Sodium 116 (*)    Chloride 78 (*)    Glucose, Bld 181 (*)    Hemoglobin 10.5 (*)    HCT 31.0 (*)    All other components within normal limits  POCT I-STAT EG7 - Abnormal; Notable for the following components:   pH, Ven 7.539 (*)    pCO2, Ven 31.2 (*)    pO2, Ven 189.0 (*)    Acid-Base Excess 4.0 (*)    Sodium 116 (*)    Potassium 3.4 (*)    Calcium, Ion 1.06 (*)    HCT 33.0 (*)    Hemoglobin 11.2 (*)    All other components within normal limits  CULTURE, BLOOD (SINGLE)  SARS CORONAVIRUS 2 (TAT 6-24 HRS)  LIPASE, BLOOD  LACTIC ACID, PLASMA  ETHANOL  PROTIME-INR  APTT  I-STAT CHEM 8, ED    CT HEAD CODE STROKE WO CONTRAST  Final Result    DG Chest Port 1 View  Final Result      Medications  lidocaine (XYLOCAINE) 0.5 % injection 30 mL (has no administration in time range)  sodium chloride 0.9 % bolus 1,000 mL (1,000 mLs Intravenous New Bag/Given 02/26/2020 1058)  HYDROmorphone (DILAUDID) injection 0.5 mg (0.5 mg Intravenous Given 02/15/2020 1205)     Procedures  /  Critical Care .Critical Care Performed by: Maudie Flakes, MD Authorized by: Maudie Flakes, MD   Critical care provider statement:    Critical care time (minutes):  45   Critical care was necessary to treat or prevent imminent or  life-threatening deterioration of the following conditions:  Metabolic crisis   Critical care was time spent personally by me on the following activities:  Discussions with consultants, evaluation of patient's response to treatment, examination of patient, ordering and performing treatments and interventions, ordering and review of laboratory studies, ordering and review of radiographic studies, pulse oximetry, re-evaluation of patient's condition, obtaining history from patient or surrogate and review of old charts    ED Course and Medical Decision Making  I have reviewed the triage vital signs, the nursing notes, and pertinent available records from the EMR.  Pertinent labs & imaging results that were available during my care of the patient were reviewed by me and considered in my medical decision making (see below for details).     Altered mental status, question of metabolic disarray, polypharmacy, less likely stroke.  Also considering UTI, sepsis though no fever.  Work-up pending.  2:43 PM update: During patient's initial assessment it was noted that she had a gaze preference to the right.  This coupled with her inability to converse, garbled speech, question of aphasia led to neurology consultation.  Last known normal less than 24 hours ago, after discussing with Dr. Lorraine Lax we agreed to initiate code stroke protocol.  CT without contrast unremarkable, and then it was revealed the patient's sodium is 116, likely explaining patient's symptoms.  Code stroke canceled.  Providing IV normal saline, discussed with hospitalist service, given the profoundly low sodium and altered mental status will admit to intensivist service.  There was  report from rehab facility that there has been drainage from the surgical site.  Looks well on my exam, well-healing incision, no signs of infection, no discharge for me.  Neurosurgery was kind enough to evaluate the patient, and they suspect a CSF leak.  They will follow  in consultation.  Beverly Jimenez, [place] [email or web address]  Final Clinical Impressions(s) / ED Diagnoses     ICD-10-CM   1. Altered mental status, unspecified altered mental status type  R41.82   2. Hyponatremia  E87.1     ED Discharge Orders    None       Discharge Instructions Discussed with and Provided to Patient:   Discharge Instructions   None       Maudie Flakes, MD 02/03/2020 1245    Maudie Flakes, MD 02/28/2020 1246

## 2020-02-06 NOTE — H&P (Signed)
.   Beverly Jimenez, MRN:  [id number], DOB:  Apr 29, 1925, LOS: 0 ADMISSION DATE:  02/09/2020, CONSULTATION DATE:  02/05/2020 REFERRING MD:  Dr. Sedonia Small, CHIEF COMPLAINT:  AMS with respiratory distress    Brief History   86yo female presented from local SNF with AMS, found to be significantly hyponatremic with a sodium of 116. PCCM consulted for admission.   History of present illness   Kennetra Jimenez Is a 86yo female with recent (2/23 - 2/25) hospitalization for posterior lumbar fusion after which she was discharged to SNF for rehab who presented with complaint of AMS. PMX includes lumbar stenosis, GERD, Depression, anemia, anxiety, arthritis, HTN, and HLD. History obtained from son as patient is currently encephalopathic. Per son patient originally did well post surgery with only mild pain post surgery for which she was was prescribed Valium and Oxycodone. Son reports over the last few days his mom was  recently placed on 2L [place] and that her back incision was recently noted to be draining.   On arrival vital signs appeared stable but over the course of her ED stay she became tachypnic, hypertensive, and tachycardiac with worsening mentation. Due to right gaze preference code stroke called but Head CT negative. Labwork significant for severe hyponatremia with sodium of 116, CL 78, WBC 21.4, hgb 10.3, and lactic within normal of 1.7. PCCM consulted for further management and admission.   Past Medical History  GERD Depression Anemia Anxiety Arthritis HTN  HLD  Significant Hospital Events   Admitted 3/4  Consults:  Neurosurgery   Procedures:    Significant Diagnostic Tests:  CXR 3/4 > Bibasilar atelectasis   Head CT 3/4 > 1. No intracranial hemorrhage or acute infarct 2. Mild to moderate hydrocephalus has developed since 2006. No interval studies to determine chronicity. 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  Micro Data:  COVID 3/4 > Blood culture 3/4  > Urine 3/4 >  Antimicrobials:     Interim history/subjective:  Lying in bed respond to painful stimuli only   Objective   Blood pressure (!) 196/89, pulse (!) 125, temperature 98.9 F (37.2 C), temperature source Rectal, resp. rate (!) 36, SpO2 94 %.       No intake or output data in the 24 hours ending 02/09/2020 1322 There were no vitals filed for this visit.  Examination: General: Chronically ill appearing elderly female lying in bed in mild respiratory distress, in NAD HEENT: [place]/AT, MM pink/moist, PERRL, [place] in place   Neuro: Responsive to painful stimuli only   CV: s1s2 regular rate and rhythm, no murmur, rubs, or gallops,  PULM:  Bilateral rhonchi with upper airway rhonchi as well, oxygen saturations 94-98 on 2L   GI: soft, bowel sounds active in all 4 quadrants, non-tender, non-distended Extremities: warm/dry, RLE 1+ edema  Skin: no rashes or lesions   Resolved Hospital Problem list     Assessment & Plan:  Acute respiratory insuffiencey  History of asthma  -ABG on admission 7.5/31.2/189/26.6. Patient is seen with rhonchi bilateral with concern for ability to protect her airway  P: NTS suction as needed  Supplemental oxygen to maintain sats greater than 92 HOB greater than 30 degrees Pulmonary hygiene Verified with family patient would want to be intubated if needed Correction of underlying encephalopathy as below  Close monitoring in the ICU setting  Repeat ABG and CRX as needed   Acute hyponatremia -Sodium 117 on admission with associated hypochloridemia. Appears hypovolemic 8L water deficient  P:  Q4hr Bmets  NS infusion Goal correction of 4-69mEq in 24hrs  Obtain urine osmolarity and urine sodium   Acute metabolic encephalopathy -At baseline patient is alert and oriented on admission she is only responsive to pain -Head CT negative on admission P: Likely due to severe hyponatremia, correction as above  EEG negative for seizure  Close monitoring in  the ICU setting given minimal responsiveness Delirium precautions  Minimize sedation as able   Rule out Sepsis  Leukocytosis  -Patient dose meet criteria for sepsis with tachypnea, tachycardia, leukocytosis, and acute AMS. However patient dose not appear grossly infected. P: Admit ICU Continue supplemental oxygen Pan cultures prior to antibiotic Broad spectrum IV antibiotics with low threshold to discontinue  Aggressive IV hydration MAP< 65  Lactic acid WNL Monitor urine output Per nursing back incision is C/D/I  Hypertensive urgency  -Med recc reveals no baseline antihypertensives  P: PRN IV hydralazine Closely monitor  IV hydration as above   Recent lumbar fusion  P Management per neurosurgery  Supportive care    Best practice:  Diet: NPO Pain/Anxiety/Delirium protocol (if indicated): N/a VAP protocol (if indicated): N/A DVT prophylaxis: Subq heparin  GI prophylaxis: PPI Glucose control: Monitor  Mobility: Bedrest  Code Status: Full Family Communication: Son updated at bedside  Disposition: ICU   Labs   CBC: Recent Labs  Lab 02/22/2020 0940 02/19/2020 1009 02/13/2020 1019 02/29/2020 1040  WBC 21.4*  --   --   --   NEUTROABS  --  19.8*  --   --   HGB 10.3*  --  11.2* 10.5*  HCT 30.8*  --  33.0* 31.0*  MCV 80.8  --   --   --   PLT 502*  --   --   --     Basic Metabolic Panel: Recent Labs  Lab 02/07/2020 0940 02/19/2020 1019 02/08/2020 1040  NA 117* 116* 116*  K 3.5 3.4* 3.5  CL 81*  --  78*  CO2 24  --   --   GLUCOSE 171*  --  181*  BUN 10  --  9  CREATININE 0.55  --  0.50  CALCIUM 9.4  --   --    GFR: Estimated Creatinine Clearance: 45.8 mL/min (by C-G formula based on SCr of 0.5 mg/dL). Recent Labs  Lab 02/18/2020 0940  WBC 21.4*  LATICACIDVEN 1.7    Liver Function Tests: Recent Labs  Lab 02/11/2020 0940  AST 27  ALT 41  ALKPHOS 63  BILITOT 1.5*  PROT 7.0  ALBUMIN 3.2*   Recent Labs  Lab 02/14/2020 0940  LIPASE 22   No results for  input(s): AMMONIA in the last 168 hours.  ABG    Component Value Date/Time   HCO3 26.6 02/24/2020 1019   TCO2 28 02/18/2020 1040   O2SAT 100.0 02/09/2020 1019     Coagulation Profile: Recent Labs  Lab 02/21/2020 1009  INR 1.0    Cardiac Enzymes: No results for input(s): CKTOTAL, CKMB, CKMBINDEX, TROPONINI in the last 168 hours.  HbA1C: No results found for: HGBA1C  CBG: Recent Labs  Lab 02/09/2020 0931  GLUCAP 176*    Review of Systems:   Unable to assess due to encephalopathy   Past Medical History  She,  has a past medical history of Anemia, Anxiety, Arthritis, Asthma, Constipation, Depression, GERD (gastroesophageal reflux disease), Glaucoma, High cholesterol, History of hiatal hernia, Hypertension, Lumbar stenosis, Myalgia, Numbness and tingling, Osteoporosis, Pneumonia, Shortness of breath, and Wears hearing aid.   Surgical  History    Past Surgical History:  Procedure Laterality Date  . APPENDECTOMY    . BACK SURGERY  01/2020   L2-4 fusion  . CHOLECYSTECTOMY    . COLONOSCOPY     Hx: of  . COLONOSCOPY WITH PROPOFOL N/A 09/18/2017   Procedure: COLONOSCOPY WITH PROPOFOL;  Surgeon: Wilford Corner, MD;  Location: [facility];  Service: Endoscopy;  Laterality: N/A;  . EYE SURGERY     catarac  . 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 HERNIA REPAIR    . LUMBAR LAMINECTOMY/DECOMPRESSION MICRODISCECTOMY Left 07/05/2013   Procedure: LUMBAR LAMINECTOMY/DECOMPRESSION MICRODISCECTOMY 1 LEVEL Left Lumbar five-Sacral one;  Surgeon: Charlie Pitter, MD;  Location: [facility];  Service: Neurosurgery;  Laterality: Left;  Left Lumbar Five-Sacral One Microdiskectomy  . MULTIPLE TOOTH EXTRACTIONS    . VASCULAR SURGERY     varicose vein surgery     Social History   reports that she has never smoked. She has never used smokeless tobacco. She reports that she does not drink alcohol or use drugs.   Family History   Her family history includes Diabetes in her brother and  another family member; Glaucoma in an other family member; Heart disease in her brother and another family member; Hypertension in her brother and another family member; Varicose Veins in her brother, daughter, mother, and sister.   Allergies Allergies  Allergen Reactions  . Amoxicillin Hives, Itching, Rash and Other (See Comments)    Has patient had a PCN reaction causing immediate rash, facial/tongue/throat swelling, SOB or lightheadedness with hypotension: Unknown Has patient had a PCN reaction causing SEVERE rash involving mucus membranes or skin necrosis:    #  #  YES  #  #  Has patient had a PCN reaction that required hospitalization: No Has patient had a PCN reaction occurring within the last 10 years: No    . Macrodantin [Nitrofurantoin Macrocrystal] Shortness Of Breath  . Penicillins Other (See Comments)    SEE AMOXICILLIN  . Sulfa Antibiotics Shortness Of Breath and Rash  . Cymbalta [Duloxetine Hcl]     UNSPECIFIED REACTION      Home Medications  Prior to Admission medications   Medication Sig Start Date End Date Taking? Authorizing Provider  acetaminophen (TYLENOL) 500 MG tablet Take 1-2 tablets (500-1,000 mg total) by mouth every 8 (eight) hours as needed for mild pain, moderate pain, fever or headache. Patient taking differently: Take 1,000 mg by mouth 2 (two) times daily as needed for mild pain, moderate pain, fever or headache.  09/18/17  Yes Wilford Corner, MD  albuterol (PROVENTIL HFA;VENTOLIN HFA) 108 (90 Base) MCG/ACT inhaler Inhale 2 puffs into the lungs every 4 (four) hours as needed for wheezing or shortness of breath. 01/18/17  Yes Ghimire, Henreitta Leber, MD  ALPRAZolam Duanne Moron) 0.5 MG tablet Take 1 tablet (0.5 mg total) by mouth at bedtime. 01/30/20  Yes Viona Gilmore D, NP  Calcium Carbonate-Vit D-Min (CALTRATE PLUS PO) Take 1 tablet by mouth 2 (two) times daily.    Yes [provider]  Chloramphenicol POWD Place 1 application into the left ear every  Thursday. Chloramphenicol/Sulfamenth/Amphoteracin    [employer]   Yes [provider]  cholecalciferol (VITAMIN D3) 25 MCG (1000 UNIT) tablet Take 1,000 Units by mouth daily.   Yes [provider]  Cyanocobalamin (VITAMIN B-12 PO) Place 1 tablet under the tongue daily. 533mcg   Yes [provider]  dorzolamide (TRUSOPT) 2 % ophthalmic solution  Place 1 drop into both eyes 2 (two) times daily.   Yes [provider]  fish oil-omega-3 fatty acids 1000 MG capsule Take 1 g by mouth 2 (two) times daily.    Yes [provider]  fluticasone (FLONASE) 50 MCG/ACT nasal spray Place 1 spray into both nostrils at bedtime.    Yes [provider]  Fluticasone-Salmeterol (ADVAIR) 100-50 MCG/DOSE AEPB Inhale 1 puff into the lungs 2 (two) times daily.   Yes [provider]  gabapentin (NEURONTIN) 100 MG capsule Take 100 mg by mouth at bedtime. X 2 weeks 02/03/20  Yes [provider]  guaiFENesin (MUCINEX) 600 MG 12 hr tablet Take 600 mg by mouth daily as needed for cough.   Yes [provider]  iron polysaccharides (NIFEREX) 150 MG capsule Take 150 mg by mouth every morning.   Yes [provider]  LATANOPROST OP Place 1 drop into both eyes at bedtime.   Yes [provider]  loratadine (CLARITIN) 10 MG tablet Take 10 mg by mouth daily as needed for allergies.    Yes [provider]  losartan (COZAAR) 50 MG tablet Take 50 mg by mouth daily.   Yes [provider]  niacin 500 MG tablet Take 500 mg by mouth daily with breakfast.   Yes [provider]  omeprazole (PRILOSEC) 20 MG capsule Take 20 mg by mouth daily.   Yes [provider]  Potassium 99 MG TABS Take 99 mg by mouth daily.   Yes [provider]  Probiotic Product (ALIGN PO) Take 1 capsule by mouth every morning.   Yes [provider]  sennosides-docusate sodium (SENOKOT-S) 8.6-50 MG tablet Take 2 tablets  by mouth daily.   Yes [provider]  sodium chloride (OCEAN) 0.65 % SOLN nasal spray Place 1 spray into both nostrils as needed for congestion.   Yes [provider]  traMADol (ULTRAM) 50 MG tablet Take 25 mg by mouth 2 (two) times daily as needed for moderate pain.   Yes [provider]  vitamin C (ASCORBIC ACID) 500 MG tablet Take 500 mg by mouth daily.   Yes [provider]  vitamin E 400 UNIT capsule Take 400 Units by mouth daily.   Yes [provider]  diazepam (VALIUM) 5 MG tablet Take 1-2 tablets (5-10 mg total) by mouth every 6 (six) hours as needed for muscle spasms. Patient not taking: Reported on 02/25/2020 01/30/20   Viona Gilmore D, NP  oxyCODONE (OXY IR/ROXICODONE) 5 MG immediate release tablet Take 1-2 tablets (5-10 mg total) by mouth every 4 (four) hours as needed for severe pain ((score 7 to 10)). Patient not taking: Reported on 02/08/2020 01/30/20   Viona Gilmore D, NP     Critical care time:   Performed by: Johnsie Cancel  Total critical care time: 45 minutes  Critical care time was exclusive of separately billable procedures and treating other patients.  Critical care was necessary to treat or prevent imminent or life-threatening deterioration.  Critical care was time spent personally by me on the following activities: development of treatment plan with patient and/or surrogate as well as nursing, discussions with consultants, evaluation of patient's response to treatment, examination of patient, obtaining history from patient or surrogate, ordering and performing treatments and interventions, ordering and review of laboratory studies, ordering and review of radiographic studies, pulse oximetry and re-evaluation of patient's condition.  Johnsie Cancel, NP-C [facility] Contact / Pager information can be  found on Amion  02/08/2020, 2:45 PM

## 2020-02-06 NOTE — Progress Notes (Signed)
EEG complete - results pending 

## 2020-02-06 NOTE — ED Notes (Signed)
EEG at bedside.

## 2020-02-06 NOTE — Progress Notes (Addendum)
10am-CSW received voicemail from patient's son, Jeneen Rinks, stating that at discharge they are requesting to return home rather than [facility]. CSW will provide handoff to covering CSW.   10:30am-Pt's son called again and is requesting CIR. CSW explained that patient would have to meet requirements for that program, including being admitted.    Percell Locus Jozef Eisenbeis LCSW [phone]

## 2020-02-07 ENCOUNTER — Inpatient Hospital Stay (HOSPITAL_COMMUNITY): Payer: Medicare Other

## 2020-02-07 DIAGNOSIS — R609 Edema, unspecified: Secondary | ICD-10-CM

## 2020-02-07 LAB — CBC
HCT: 26.4 % — ABNORMAL LOW (ref 36.0–46.0)
Hemoglobin: 8.9 g/dL — ABNORMAL LOW (ref 12.0–15.0)
MCH: 27 pg (ref 26.0–34.0)
MCHC: 33.7 g/dL (ref 30.0–36.0)
MCV: 80 fL (ref 80.0–100.0)
Platelets: 390 10*3/uL (ref 150–400)
RBC: 3.3 MIL/uL — ABNORMAL LOW (ref 3.87–5.11)
RDW: 13.2 % (ref 11.5–15.5)
WBC: 16.8 10*3/uL — ABNORMAL HIGH (ref 4.0–10.5)
nRBC: 0 % (ref 0.0–0.2)

## 2020-02-07 LAB — BASIC METABOLIC PANEL
Anion gap: 15 (ref 5–15)
Anion gap: 12 (ref 5–15)
Anion gap: 13 (ref 5–15)
Anion gap: 11 (ref 5–15)
Anion gap: 6 (ref 5–15)
BUN: 12 mg/dL (ref 8–23)
BUN: 8 mg/dL (ref 8–23)
BUN: 9 mg/dL (ref 8–23)
BUN: 10 mg/dL (ref 8–23)
BUN: 9 mg/dL (ref 8–23)
CO2: 21 mmol/L — ABNORMAL LOW (ref 22–32)
CO2: 22 mmol/L (ref 22–32)
CO2: 20 mmol/L — ABNORMAL LOW (ref 22–32)
CO2: 21 mmol/L — ABNORMAL LOW (ref 22–32)
CO2: 22 mmol/L (ref 22–32)
Calcium: 8.8 mg/dL — ABNORMAL LOW (ref 8.9–10.3)
Calcium: 8.4 mg/dL — ABNORMAL LOW (ref 8.9–10.3)
Calcium: 8.2 mg/dL — ABNORMAL LOW (ref 8.9–10.3)
Calcium: 8.5 mg/dL — ABNORMAL LOW (ref 8.9–10.3)
Calcium: 8.1 mg/dL — ABNORMAL LOW (ref 8.9–10.3)
Chloride: 87 mmol/L — ABNORMAL LOW (ref 98–111)
Chloride: 89 mmol/L — ABNORMAL LOW (ref 98–111)
Chloride: 90 mmol/L — ABNORMAL LOW (ref 98–111)
Chloride: 90 mmol/L — ABNORMAL LOW (ref 98–111)
Chloride: 94 mmol/L — ABNORMAL LOW (ref 98–111)
Creatinine, Ser: 0.49 mg/dL (ref 0.44–1.00)
Creatinine, Ser: 0.48 mg/dL (ref 0.44–1.00)
Creatinine, Ser: 0.5 mg/dL (ref 0.44–1.00)
Creatinine, Ser: 0.55 mg/dL (ref 0.44–1.00)
Creatinine, Ser: 0.41 mg/dL — ABNORMAL LOW (ref 0.44–1.00)
GFR calc Af Amer: >60 mL/min (ref >60)
GFR calc Af Amer: >60 mL/min (ref >60)
GFR calc Af Amer: >60 mL/min (ref >60)
GFR calc Af Amer: >60 mL/min (ref >60)
GFR calc Af Amer: >60 mL/min (ref >60)
GFR calc non Af Amer: >60 mL/min (ref >60)
GFR calc non Af Amer: >60 mL/min (ref >60)
GFR calc non Af Amer: >60 mL/min (ref >60)
GFR calc non Af Amer: >60 mL/min (ref >60)
GFR calc non Af Amer: >60 mL/min (ref >60)
Glucose, Bld: 112 mg/dL — ABNORMAL HIGH (ref 70–99)
Glucose, Bld: 108 mg/dL — ABNORMAL HIGH (ref 70–99)
Glucose, Bld: 90 mg/dL (ref 70–99)
Glucose, Bld: 113 mg/dL — ABNORMAL HIGH (ref 70–99)
Glucose, Bld: 123 mg/dL — ABNORMAL HIGH (ref 70–99)
Potassium: 3.4 mmol/L — ABNORMAL LOW (ref 3.5–5.1)
Potassium: 3.2 mmol/L — ABNORMAL LOW (ref 3.5–5.1)
Potassium: 3.5 mmol/L (ref 3.5–5.1)
Potassium: 3.2 mmol/L — ABNORMAL LOW (ref 3.5–5.1)
Potassium: 3.1 mmol/L — ABNORMAL LOW (ref 3.5–5.1)
Sodium: 123 mmol/L — ABNORMAL LOW (ref 135–145)
Sodium: 123 mmol/L — ABNORMAL LOW (ref 135–145)
Sodium: 123 mmol/L — ABNORMAL LOW (ref 135–145)
Sodium: 122 mmol/L — ABNORMAL LOW (ref 135–145)
Sodium: 122 mmol/L — ABNORMAL LOW (ref 135–145)

## 2020-02-07 LAB — MAGNESIUM
Magnesium: 1.5 mg/dL — ABNORMAL LOW (ref 1.7–2.4)
Magnesium: 1.6 mg/dL — ABNORMAL LOW (ref 1.7–2.4)
Magnesium: 1.7 mg/dL (ref 1.7–2.4)

## 2020-02-07 LAB — GLUCOSE, CAPILLARY
Glucose-Capillary: 104 mg/dL — ABNORMAL HIGH (ref 70–99)
Glucose-Capillary: 101 mg/dL — ABNORMAL HIGH (ref 70–99)
Glucose-Capillary: 103 mg/dL — ABNORMAL HIGH (ref 70–99)
Glucose-Capillary: 121 mg/dL — ABNORMAL HIGH (ref 70–99)
Glucose-Capillary: 130 mg/dL — ABNORMAL HIGH (ref 70–99)
Glucose-Capillary: 135 mg/dL — ABNORMAL HIGH (ref 70–99)

## 2020-02-07 LAB — PHOSPHORUS
Phosphorus: 2.7 mg/dL (ref 2.5–4.6)
Phosphorus: 2.7 mg/dL (ref 2.5–4.6)
Phosphorus: 1.8 mg/dL — ABNORMAL LOW (ref 2.5–4.6)

## 2020-02-07 LAB — POCT I-STAT 7, (LYTES, BLD GAS, ICA,H+H)
Acid-base deficit: 1 mmol/L (ref 0.0–2.0)
Bicarbonate: 22.1 mmol/L (ref 20.0–28.0)
Calcium, Ion: 1.14 mmol/L — ABNORMAL LOW (ref 1.15–1.40)
HCT: 28 % — ABNORMAL LOW (ref 36.0–46.0)
Hemoglobin: 9.5 g/dL — ABNORMAL LOW (ref 12.0–15.0)
O2 Saturation: 94 %
Patient temperature: 97.6
Potassium: 2.7 mmol/L — CL (ref 3.5–5.1)
Sodium: 120 mmol/L — ABNORMAL LOW (ref 135–145)
TCO2: 23 mmol/L (ref 22–32)
pCO2 arterial: 30.2 mmHg — ABNORMAL LOW (ref 32.0–48.0)
pH, Arterial: 7.47 — ABNORMAL HIGH (ref 7.350–7.450)
pO2, Arterial: 65 mmHg — ABNORMAL LOW (ref 83.0–108.0)

## 2020-02-07 MED ORDER — POTASSIUM CHLORIDE 20 MEQ/15ML (10%) PO SOLN
20.0000 meq | ORAL | Status: AC
  Administered 2020-02-07 (×2): 20 meq
  Filled 2020-02-07 (×2): qty 15

## 2020-02-07 MED ORDER — SODIUM CHLORIDE 0.9 % IV BOLUS
1000.0000 mL | Freq: Once | INTRAVENOUS | Status: AC
  Administered 2020-02-07: 17:00:00 1000 mL via INTRAVENOUS

## 2020-02-07 MED ORDER — SODIUM CHLORIDE 0.9 % IV BOLUS
500.0000 mL | Freq: Once | INTRAVENOUS | Status: AC
  Administered 2020-02-07: 500 mL via INTRAVENOUS

## 2020-02-07 MED ORDER — AMLODIPINE BESYLATE 5 MG PO TABS
5.0000 mg | ORAL_TABLET | Freq: Every day | ORAL | Status: DC
  Administered 2020-02-07 – 2020-02-08 (×2): 5 mg
  Filled 2020-02-07 (×2): qty 1

## 2020-02-07 MED ORDER — MAGNESIUM SULFATE 2 GM/50ML IV SOLN
2.0000 g | Freq: Once | INTRAVENOUS | Status: AC
  Administered 2020-02-07: 2 g via INTRAVENOUS
  Filled 2020-02-07: qty 50

## 2020-02-07 MED ORDER — PRO-STAT SUGAR FREE PO LIQD
30.0000 mL | Freq: Every day | ORAL | Status: DC
  Administered 2020-02-08 – 2020-02-10 (×3): 30 mL
  Filled 2020-02-07 (×3): qty 30

## 2020-02-07 MED ORDER — POTASSIUM CHLORIDE 20 MEQ/15ML (10%) PO SOLN
30.0000 meq | ORAL | Status: AC
  Administered 2020-02-07 – 2020-02-08 (×2): 30 meq
  Filled 2020-02-07 (×2): qty 30

## 2020-02-07 MED ORDER — VITAL HIGH PROTEIN PO LIQD
1000.0000 mL | ORAL | Status: DC
  Administered 2020-02-07: 1000 mL

## 2020-02-07 MED ORDER — PANTOPRAZOLE SODIUM 40 MG PO PACK
40.0000 mg | PACK | Freq: Every day | ORAL | Status: DC
  Administered 2020-02-07 – 2020-02-09 (×3): 40 mg
  Filled 2020-02-07 (×4): qty 20

## 2020-02-07 MED ORDER — IPRATROPIUM-ALBUTEROL 0.5-2.5 (3) MG/3ML IN SOLN: 3.0000 mL | Freq: Four times a day (QID) | RESPIRATORY_TRACT | Status: DC | PRN

## 2020-02-07 MED ORDER — VITAL AF 1.2 CAL PO LIQD
1000.0000 mL | ORAL | Status: DC
  Administered 2020-02-07 – 2020-02-10 (×3): 1000 mL
  Filled 2020-02-07 (×2): qty 1000

## 2020-02-07 MED ORDER — LEVOFLOXACIN IN D5W 250 MG/50ML IV SOLN
250.0000 mg | INTRAVENOUS | Status: DC
  Administered 2020-02-07 – 2020-02-08 (×2): 250 mg via INTRAVENOUS
  Filled 2020-02-07 (×2): qty 50

## 2020-02-07 MED ORDER — PRO-STAT SUGAR FREE PO LIQD
30.0000 mL | Freq: Two times a day (BID) | ORAL | Status: DC
  Administered 2020-02-07: 10:00:00 30 mL
  Filled 2020-02-07: qty 30

## 2020-02-07 NOTE — Progress Notes (Signed)
Providing Compassionate, Quality Care - Together   Subjective: Patient is intubated. Fentanyl gtt infusing. Not interactive per nurse.   Objective: Vital signs in last 24 hours: Temp:  [96.7 F (35.9 C)-98.6 F (37 C)] 96.7 F (35.9 C) (03/05 0700) Pulse Rate:  [31-125] 88 (03/05 0827) Resp:  [0-37] 29 (03/05 0827) BP: (76-215)/(46-111) 153/54 (03/05 0800) SpO2:  [92 %-100 %] 100 % (03/05 0827) FiO2 (%):  [40 %-50 %] 40 % (03/05 0827) Weight:  [83.4 kg-84 kg] 83.4 kg (03/05 0435)  Intake/Output from previous day: 03/04 0701 - 03/05 0700 In: 1542.7 [I.V.:942.8; IV Piggyback:599.9] Out: 1500 [Urine:1500] Intake/Output this shift: Total I/O In: 5 [I.V.:5] Out: -   Constitutional: She is intubated and sedated  HENT:  Head: Normocephalic and atraumatic.  Eyes: Pupils are equal, round, and reactive to light.  Cardiovascular: Regular rhythm. Respiratory: She is intubated Musculoskeletal:        General: Normal range of motion.     Cervical back: Normal range of motion and neck supple.  Neurological: Not following commands. Withdraws to pain Skin: Skin is warm and dry.  Lab Results: Recent Labs    02/15/2020 1411 02/03/2020 1646 02/07/20 0335 02/07/20 0755  WBC 11.2*  --   --  16.8*  HGB 10.0*   < > 9.5* 8.9*  HCT 30.4*   < > 28.0* 26.4*  PLT 441*  --   --  390   < > = values in this interval not displayed.   BMET Recent Labs    02/07/20 0302 02/07/20 0302 02/07/20 0335 02/07/20 0755  NA 123*   < > 120* 123*  K 3.4*   < > 2.7* 3.2*  CL 87*  --   --  89*  CO2 21*  --   --  22  GLUCOSE 112*  --   --  108*  BUN 12  --   --  8  CREATININE 0.49  --   --  0.48  CALCIUM 8.8*  --   --  8.4*   < > = values in this interval not displayed.    Studies/Results: EEG  Result Date: 02/16/2020 Lora Havens, MD     03/04/2020 12:38 PM Patient Name: KAMEA ALLINSON MRN: [id number] Epilepsy Attending: Lora Havens Referring Physician/Provider: Etta Quill, PA Date:  03/02/2020 Duration: 24.59 minutes Patient history: 86 year old female presented with altered mental status.  On exam was noted to have right gaze preference.  EEG to evaluate for seizures. Level of alertness: Lethargic AEDs during EEG study: None Technical aspects: This EEG study was done with scalp electrodes positioned according to the 10-20 International system of electrode placement. Electrical activity was acquired at a sampling rate of 500Hz  and reviewed with a high frequency filter of 70Hz  and a low frequency filter of 1Hz . EEG data were recorded continuously and digitally stored. Description: No clear posterior dominant rhythm was seen.  EEG showed continuous generalized 3 to 6 Hz theta-delta slowing.  Hyperventilation and photic stimulation were not performed due to altered mental status. Abnormality - Continuous slow, generalized IMPRESSION: This study is suggestive of moderate diffuse encephalopathy, nonspecific to etiology. No seizures or epileptiform discharges were seen throughout the recording. Dr. Lorraine Lax was notified. Lora Havens   Portable Chest xray  Result Date: 02/07/2020 CLINICAL DATA:  Intubation. Stroke EXAM: PORTABLE CHEST 1 VIEW COMPARISON:  One-view chest x-ray 02/14/2020 FINDINGS: The heart size is normal. Endotracheal tube terminates 2.1 cm above the carina, in satisfactory position.  Atherosclerotic changes are present at the aortic arch. Mild pulmonary vascular congestion is present. Increasing left basilar opacity is noted. IMPRESSION: 1. Satisfactory positioning of endotracheal tube. 2. Increasing left basilar airspace disease. While this likely reflects atelectasis, infection or aspiration is not excluded. Electronically Signed   By: San Morelle M.D.   On: 02/07/2020 07:04   DG Chest Portable 1 View  Result Date: 02/18/2020 CLINICAL DATA:  Endotracheal placement.  Stroke presentation. EXAM: PORTABLE CHEST 1 VIEW COMPARISON:  Earlier same day FINDINGS: Endotracheal  tube tip 2 cm above the carina. Orogastric or nasogastric tube enters the stomach with its tip in the fundus. Heart size is normal. Aortic atherosclerosis. Patchy bilateral pulmonary densities could represent developing pneumonia, possibly aspiration. No dense consolidation or collapse. IMPRESSION: Endotracheal tube and gastric tube well positioned. Developing patchy bilateral pulmonary infiltrates could be pneumonia, possibly aspiration. Electronically Signed   By: Nelson Chimes M.D.   On: 02/20/2020 16:03   DG Chest Port 1 View  Result Date: 03/03/2020 CLINICAL DATA:  Altered mental status EXAM: PORTABLE CHEST 1 VIEW COMPARISON:  01/16/2017 FINDINGS: Heart is upper limits normal in size. Bibasilar atelectasis. No effusions or acute bony abnormality. IMPRESSION: Bibasilar atelectasis. Electronically Signed   By: Rolm Baptise M.D.   On: 03/04/2020 10:02   CT HEAD CODE STROKE WO CONTRAST  Result Date: 02/15/2020 CLINICAL DATA:  Code stroke. Neuro deficit acute. Rule out stroke. Altered mental status. EXAM: CT HEAD WITHOUT CONTRAST TECHNIQUE: Contiguous axial images were obtained from the base of the skull through the vertex without intravenous contrast. COMPARISON:  MRI head 03/30/2005 FINDINGS: Brain: Progressive enlargement of the third and lateral ventricles with rounded frontal horns and rounded third ventricle suggesting obstruction. Mild dilatation of the proximal aqueduct. Fourth ventricle normal in size. Findings suggest obstructive hydrocephalus. Mild atrophy. Chronic microvascular ischemic change in the white matter. No acute infarct, hemorrhage, mass. Vascular: Negative for hyperdense vessel Skull: Negative Sinuses/Orbits: Paranasal sinuses clear. Prior sinus surgery involving the left maxillary sinus. Bilateral cataract extraction. Other: None ASPECTS (Pablo Stroke Program Early CT Score) - Ganglionic level infarction (caudate, lentiform nuclei, internal capsule, insula, M1-M3 cortex): 7 -  Supraganglionic infarction (M4-M6 cortex): 3 Total score (0-10 with 10 being normal): 10 IMPRESSION: 1. No intracranial hemorrhage or acute infarct 2. Mild to moderate hydrocephalus has developed since 2006. No interval studies to determine chronicity. 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 3. ASPECTS is 10 4. These results were called by telephone at the time of interpretation on 02/29/2020 at 10:28 am to provider Aroor, who verbally acknowledged these results. Electronically Signed   By: Franchot Gallo M.D.   On: 03/03/2020 10:29    Assessment/Plan: Ms. Lerer is one week status post L2-3 posterior fusion by Dr. Annette Stable. She appears to have developed a metabolic encephalopathy since her discharge on 01/30/2020. She was admitted to the ICU due to her critically low sodium and concern for airway protection. She was intubated in the emergency department. Her wound has been reinforced. Neurosurgery will continue to follow and monitor wound for further signs of CSF leak.   LOS: 1 day    -No signs of obvious CSF leak -Medical management per CCM. Sodium improving.   Viona Gilmore, DNP, AGNP-C Nurse Practitioner  [facility] [place] [address], [address], [place], [place] [address] P: [phone]    F: [phone]  02/07/2020, 10:22 AM

## 2020-02-07 NOTE — Progress Notes (Signed)
Call back between 0000-0200 if pt not voiding, continue to monitor, no in and out at this time.

## 2020-02-07 NOTE — Progress Notes (Signed)
Initial Nutrition Assessment RD working remotely.  DOCUMENTATION CODES:   Not applicable  INTERVENTION:   Begin tube feeding:   Vital AF 1.2 at 45 ml/h (1080 ml per day)   Pro-stat 30 ml once daily   Provides 1396 kcal, 96 gm protein, 876 ml free water daily  NUTRITION DIAGNOSIS:   Inadequate oral intake related to inability to eat as evidenced by NPO status.  GOAL:   Patient will meet greater than or equal to 90% of their needs  MONITOR:   Vent status, TF tolerance, Labs, Skin  REASON FOR ASSESSMENT:   Ventilator, Consult Enteral/tube feeding initiation and management  ASSESSMENT:   86 yo female admitted from SNF with AMS and hyponatremia. PMH includes HTN, asthma, recent lumbar fusion (Jan 28, 2020).   Received MD Consult for TF initiation and management.   Neurology consulting for clear fluid leaking from surgical site.   Patient is currently intubated on ventilator support MV: 10.6 L/min Temp (24hrs), Avg:97.7 F (36.5 C), Min:96.7 F (35.9 C), Max:98.6 F (37 C)   Labs reviewed. Na 123 (L, trending up), K 3.2 (L), Mag 1.5 (L) CBG's: 104-101 Medications reviewed.   Usual weight 79.4 kg (2/23), BMI=30 Currently 83.4 kg  NUTRITION - FOCUSED PHYSICAL EXAM:  unable to complete  Diet Order:   Diet Order            Diet NPO time specified  Diet effective now              EDUCATION NEEDS:   Not appropriate for education at this time  Skin:  Skin Assessment: Reviewed RN Assessment(surgical incision to lower back (2/23 lumbar fusion))  Last BM:  3/4 type 6  Height:   Ht Readings from Last 1 Encounters:  02/05/2020 5\' 4"  (1.626 m)    Weight:   Wt Readings from Last 1 Encounters:  02/07/20 83.4 kg    Ideal Body Weight:  54.5 kg  BMI:  Body mass index is 31.56 kg/m.  Estimated Nutritional Needs:   Kcal:  [id number]  Protein:  95-110 gm  Fluid:  >/= 1.5 L    Molli Barrows, RD, LDN, CNSC Please refer to Amion for contact  information.

## 2020-02-07 NOTE — Progress Notes (Signed)
Bilateral lower extremity venous duplex completed. Refer to "CV Proc" under chart review to view preliminary results.  02/07/2020 11:27 AM Kelby Aline., MHA, RVT, RDCS, RDMS

## 2020-02-07 NOTE — Progress Notes (Signed)
.   Beverly Jimenez, MRN:  [id number], DOB:  Sep 12, 1925, LOS: 1 ADMISSION DATE:  02/18/2020, CONSULTATION DATE:  02/13/2020 REFERRING MD:  Dr. Sedonia Small, CHIEF COMPLAINT:  AMS with respiratory distress    Brief History   86yo female presented from local SNF with AMS, found to be significantly hyponatremic with a sodium of 116. PCCM consulted for admission.   History of present illness   Beverly Jimenez Is a 86yo female with recent (2/23 - 2/25) hospitalization for posterior lumbar fusion after which she was discharged to SNF for rehab who presented with complaint of AMS. PMX includes lumbar stenosis, GERD, Depression, anemia, anxiety, arthritis, HTN, and HLD. History obtained from son as patient is currently encephalopathic. Per son patient originally did well post surgery with only mild pain post surgery for which she was was prescribed Valium and Oxycodone. Son reports over the last few days his mom was  recently placed on 2L [place] and that her back incision was recently noted to be draining.   On arrival vital signs appeared stable but over the course of her ED stay she became tachypnic, hypertensive, and tachycardiac with worsening mentation. Due to right gaze preference code stroke called but Head CT negative. Labwork significant for severe hyponatremia with sodium of 116, CL 78, WBC 21.4, hgb 10.3, and lactic within normal of 1.7. PCCM consulted for further management and admission.   Past Medical History  GERD Depression Anemia Anxiety Arthritis HTN  HLD  Significant Hospital Events   Admitted and intubated 3/4  Consults:  Neurosurgery   Procedures:    Significant Diagnostic Tests:  CXR 3/4 > Bibasilar atelectasis   Head CT 3/4 > 1. No intracranial hemorrhage or acute infarct 2. Mild to moderate hydrocephalus has developed since 2006. No interval studies to determine chronicity. 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  Micro Data:  COVID 3/4 > Blood  culture 3/4 > Urine 3/4 >  Antimicrobials:  Vanc 3/4>3/5 Aztreonam 3/4>3/5   Interim history/subjective:  Transferred from ED to ICU last night. Remains on mechanical ventilation and sedation  Objective   Blood pressure (!) 153/54, pulse 88, temperature (!) 96.7 F (35.9 C), temperature source Axillary, resp. rate (!) 29, height 5\' 4"  (1.626 m), weight 83.4 kg, SpO2 100 %.    Vent Mode: PSV;CPAP FiO2 (%):  [40 %-50 %] 40 % Set Rate:  [20 bmp] 20 bmp Vt Set:  [430 mL] 430 mL PEEP:  [5 cmH20] 5 cmH20 Pressure Support:  [5 cmH20] 5 cmH20 Plateau Pressure:  [18 cmH20-22 cmH20] 18 cmH20   Intake/Output Summary (Last 24 hours) at 02/07/2020 0914 Last data filed at 02/07/2020 0800 Gross per 24 hour  Intake 1547.7 ml  Output 1500 ml  Net 47.7 ml   Filed Weights   02/19/2020 1700 02/07/20 0435  Weight: 84 kg 83.4 kg   Physical Exam: General: Chronically ill-appearing and elderly female, sedated HENT: [place], AT, ETT in place Eyes: EOMI, no scleral icterus Respiratory: Coarse breath sounds bilaterally. Cardiovascular: RRR, -M/R/G, no JVD GI: BS+, soft, nontender Extremities:-Edema,-tenderness Skin: Lower back sutures with fluctuance or erythema Neuro: [facility] Problem list     Assessment & Plan:  Acute respiratory failure secondary to presumed aspiration pneumonitis/pneumonia  History of asthma  -ABG on admission 7.5/31.2/189/26.6. Patient is seen with rhonchi bilateral with concern for ability to protect her airway  P: Full vent support. On minimal vent settings SBT this am Transition antibiotics to fluroquinolone Duonebs PRN  VAP  Acute hypovolemic hyponatremia -Sodium 117 on admission with associated hypochloridemia. S/p 1.5 NS with improvement.  P: 500 NS bolus now BMET [id number]  Acute metabolic encephalopathy secondary to hyponatremia, oversedation Hydrocephalus, thought to be chronic per Neuro and not contributing to acute presentation -At baseline  patient is alert and oriented on admission she is only responsive to pain -Head CT negative on admission. EEG negative for seizures -Discharged on home pain medications post-back surgery P: Wean sedation to assess neuro status Likely due to severe hyponatremia, correction as above  Appreciate Neurology assistance. Delirium precautions  Minimize sedation as able   Hypertensive urgency  -Med rec reveals no baseline antihypertensives  P: Start amlodipine Hold ARB for now PRN IV hydralazine Closely monitor   Hypokalemia P:  Repleted  Recent lumbar fusion  P Management per neurosurgery  Supportive care    Best practice:  Diet: NPO Pain/Anxiety/Delirium protocol (if indicated): N/a VAP protocol (if indicated): N/A DVT prophylaxis: Subq heparin  GI prophylaxis: PPI Glucose control: Monitor  Mobility: Bedrest  Code Status: Full Family Communication: Son updated at bedside on 3/4. Disposition: ICU   Labs   CBC: Recent Labs  Lab 02/23/2020 0940 03/01/2020 1009 02/16/2020 1019 02/13/2020 1040 03/03/2020 1411 02/26/2020 1646 02/07/20 0335 02/07/20 0755  WBC 21.4*  --   --   --  11.2*  --   --  16.8*  NEUTROABS  --  19.8*  --   --   --   --   --   --   HGB 10.3*  --    < > 10.5* 10.0* 10.2* 9.5* 8.9*  HCT 30.8*  --    < > 31.0* 30.4* 30.0* 28.0* 26.4*  MCV 80.8  --   --   --  80.6  --   --  80.0  PLT 502*  --   --   --  441*  --   --  390   < > = values in this interval not displayed.    Basic Metabolic Panel: Recent Labs  Lab 02/28/2020 0940 02/29/2020 1019 02/21/2020 1040 02/11/2020 1040 03/02/2020 1400 02/25/2020 1400 02/22/2020 1646 02/21/2020 2211 02/07/20 0302 02/07/20 0335 02/07/20 0755  NA 117*   < > 116*   < > 118*   < > 121* 123* 123* 120* 123*  K 3.5   < > 3.5   < > 4.2   < > 3.1* 3.9 3.4* 2.7* 3.2*  CL 81*   < > 78*  --  83*  --   --  88* 87*  --  89*  CO2 24  --   --   --  23  --   --  23 21*  --  22  GLUCOSE 171*   < > 181*  --  162*  --   --  118* 112*  --  108*    BUN 10   < > 9  --  9  --   --  11 12  --  8  CREATININE 0.55   < > 0.50  --  0.57  --   --  0.55 0.49  --  0.48  CALCIUM 9.4  --   --   --  8.7*  --   --  8.4* 8.8*  --  8.4*   < > = values in this interval not displayed.   GFR: Estimated Creatinine Clearance: 46.9 mL/min (by C-G formula based on SCr of 0.48 mg/dL). Recent Labs  Lab 02/17/2020  0940 02/09/2020 1411 02/07/20 0755  WBC 21.4* 11.2* 16.8*  LATICACIDVEN 1.7  --   --     Liver Function Tests: Recent Labs  Lab 02/26/2020 0940  AST 27  ALT 41  ALKPHOS 63  BILITOT 1.5*  PROT 7.0  ALBUMIN 3.2*   Recent Labs  Lab 02/16/2020 0940  LIPASE 22   No results for input(s): AMMONIA in the last 168 hours.  ABG    Component Value Date/Time   PHART 7.470 (H) 02/07/2020 0335   PCO2ART 30.2 (L) 02/07/2020 0335   PO2ART 65.0 (L) 02/07/2020 0335   HCO3 22.1 02/07/2020 0335   TCO2 23 02/07/2020 0335   ACIDBASEDEF 1.0 02/07/2020 0335   O2SAT 94.0 02/07/2020 0335     Coagulation Profile: Recent Labs  Lab 02/29/2020 1009  INR 1.0    Cardiac Enzymes: No results for input(s): CKTOTAL, CKMB, CKMBINDEX, TROPONINI in the last 168 hours.  HbA1C: Hgb A1c MFr Bld  Date/Time Value Ref Range Status  02/05/2020 03:50 PM 5.3 4.8 - 5.6 % Final    Comment:    (NOTE) Pre diabetes:          5.7%-6.4% Diabetes:              >6.4% Glycemic control for   <7.0% adults with diabetes     CBG: Recent Labs  Lab 02/11/2020 1809 02/29/2020 1918 02/11/2020 2315 02/07/20 0312 02/07/20 0731  GLUCAP 124* 134* 101* 104* 101*    Critical care time:34   The patient is critically ill with multiple organ systems failure and requires high complexity decision making for assessment and support, frequent evaluation and titration of therapies, application of advanced monitoring technologies and extensive interpretation of multiple databases.   Rodman Pickle, M.D. [facility] Pulmonary/Critical Care Medicine 02/07/2020 9:14 AM   Please see Amion for  pager number to reach on-call Pulmonary and Critical Care Team.

## 2020-02-07 NOTE — Progress Notes (Signed)
[facility] ADULT ICU REPLACEMENT PROTOCOL FOR AM LAB REPLACEMENT ONLY  The patient does apply for the [facility] Adult ICU Electrolyte Replacment Protocol based on the criteria listed below:   1. Is GFR >/= 40 ml/min? Yes.    Patient's GFR today is >60 2. Is urine output >/= 0.5 ml/kg/hr for the last 6 hours? Yes.   Patient's UOP is 2 ml/kg/hr 3. Is BUN < 60 mg/dL? Yes.    Patient's BUN today is 12 4. Abnormal electrolyte(s): K-3.4 5. Ordered repletion with: per protocol 6. If a panic level lab has been reported, has the CCM MD in charge been notified? Yes.  .   Physician:  Dr. Lamonte Richer, Philis Nettle 02/07/2020 6:16 AM

## 2020-02-08 DIAGNOSIS — R4182 Altered mental status, unspecified: Secondary | ICD-10-CM

## 2020-02-08 LAB — BASIC METABOLIC PANEL WITH GFR
Anion gap: 9 (ref 5–15)
Anion gap: 10 (ref 5–15)
BUN: 12 mg/dL (ref 8–23)
BUN: 12 mg/dL (ref 8–23)
CO2: 23 mmol/L (ref 22–32)
CO2: 23 mmol/L (ref 22–32)
Calcium: 8.6 mg/dL — ABNORMAL LOW (ref 8.9–10.3)
Calcium: 8.8 mg/dL — ABNORMAL LOW (ref 8.9–10.3)
Chloride: 96 mmol/L — ABNORMAL LOW (ref 98–111)
Chloride: 95 mmol/L — ABNORMAL LOW (ref 98–111)
Creatinine, Ser: 0.46 mg/dL (ref 0.44–1.00)
Creatinine, Ser: 0.43 mg/dL — ABNORMAL LOW (ref 0.44–1.00)
GFR calc Af Amer: >60 mL/min (ref >60)
GFR calc Af Amer: >60 mL/min (ref >60)
GFR calc non Af Amer: >60 mL/min (ref >60)
GFR calc non Af Amer: >60 mL/min (ref >60)
Glucose, Bld: 115 mg/dL — ABNORMAL HIGH (ref 70–99)
Glucose, Bld: 133 mg/dL — ABNORMAL HIGH (ref 70–99)
Potassium: 3.7 mmol/L (ref 3.5–5.1)
Potassium: 3.6 mmol/L (ref 3.5–5.1)
Sodium: 128 mmol/L — ABNORMAL LOW (ref 135–145)
Sodium: 128 mmol/L — ABNORMAL LOW (ref 135–145)

## 2020-02-08 LAB — BASIC METABOLIC PANEL
Anion gap: 7 (ref 5–15)
Anion gap: 10 (ref 5–15)
Anion gap: 8 (ref 5–15)
Anion gap: 7 (ref 5–15)
BUN: 9 mg/dL (ref 8–23)
BUN: 9 mg/dL (ref 8–23)
BUN: 10 mg/dL (ref 8–23)
BUN: 10 mg/dL (ref 8–23)
CO2: 22 mmol/L (ref 22–32)
CO2: 22 mmol/L (ref 22–32)
CO2: 23 mmol/L (ref 22–32)
CO2: 23 mmol/L (ref 22–32)
Calcium: 8.2 mg/dL — ABNORMAL LOW (ref 8.9–10.3)
Calcium: 8.5 mg/dL — ABNORMAL LOW (ref 8.9–10.3)
Calcium: 8.7 mg/dL — ABNORMAL LOW (ref 8.9–10.3)
Calcium: 8.5 mg/dL — ABNORMAL LOW (ref 8.9–10.3)
Chloride: 95 mmol/L — ABNORMAL LOW (ref 98–111)
Chloride: 93 mmol/L — ABNORMAL LOW (ref 98–111)
Chloride: 95 mmol/L — ABNORMAL LOW (ref 98–111)
Chloride: 95 mmol/L — ABNORMAL LOW (ref 98–111)
Creatinine, Ser: 0.46 mg/dL (ref 0.44–1.00)
Creatinine, Ser: 0.42 mg/dL — ABNORMAL LOW (ref 0.44–1.00)
Creatinine, Ser: 0.43 mg/dL — ABNORMAL LOW (ref 0.44–1.00)
Creatinine, Ser: 0.43 mg/dL — ABNORMAL LOW (ref 0.44–1.00)
GFR calc Af Amer: >60 mL/min (ref >60)
GFR calc Af Amer: >60 mL/min (ref >60)
GFR calc Af Amer: >60 mL/min (ref >60)
GFR calc Af Amer: >60 mL/min (ref >60)
GFR calc non Af Amer: >60 mL/min (ref >60)
GFR calc non Af Amer: >60 mL/min (ref >60)
GFR calc non Af Amer: >60 mL/min (ref >60)
GFR calc non Af Amer: >60 mL/min (ref >60)
Glucose, Bld: 139 mg/dL — ABNORMAL HIGH (ref 70–99)
Glucose, Bld: 132 mg/dL — ABNORMAL HIGH (ref 70–99)
Glucose, Bld: 145 mg/dL — ABNORMAL HIGH (ref 70–99)
Glucose, Bld: 137 mg/dL — ABNORMAL HIGH (ref 70–99)
Potassium: 3.5 mmol/L (ref 3.5–5.1)
Potassium: 3.7 mmol/L (ref 3.5–5.1)
Potassium: 3.7 mmol/L (ref 3.5–5.1)
Potassium: 3.8 mmol/L (ref 3.5–5.1)
Sodium: 124 mmol/L — ABNORMAL LOW (ref 135–145)
Sodium: 125 mmol/L — ABNORMAL LOW (ref 135–145)
Sodium: 126 mmol/L — ABNORMAL LOW (ref 135–145)
Sodium: 125 mmol/L — ABNORMAL LOW (ref 135–145)

## 2020-02-08 LAB — CBC
HCT: 23.2 % — ABNORMAL LOW (ref 36.0–46.0)
Hemoglobin: 8.1 g/dL — ABNORMAL LOW (ref 12.0–15.0)
MCH: 27.2 pg (ref 26.0–34.0)
MCHC: 34.9 g/dL (ref 30.0–36.0)
MCV: 77.9 fL — ABNORMAL LOW (ref 80.0–100.0)
Platelets: 377 10*3/uL (ref 150–400)
RBC: 2.98 MIL/uL — ABNORMAL LOW (ref 3.87–5.11)
RDW: 13.4 % (ref 11.5–15.5)
WBC: 20.9 10*3/uL — ABNORMAL HIGH (ref 4.0–10.5)
nRBC: 0 % (ref 0.0–0.2)

## 2020-02-08 LAB — URINE CULTURE: Culture: 50000 — AB

## 2020-02-08 LAB — GLUCOSE, CAPILLARY
Glucose-Capillary: 115 mg/dL — ABNORMAL HIGH (ref 70–99)
Glucose-Capillary: 124 mg/dL — ABNORMAL HIGH (ref 70–99)
Glucose-Capillary: 128 mg/dL — ABNORMAL HIGH (ref 70–99)
Glucose-Capillary: 129 mg/dL — ABNORMAL HIGH (ref 70–99)
Glucose-Capillary: 143 mg/dL — ABNORMAL HIGH (ref 70–99)
Glucose-Capillary: 147 mg/dL — ABNORMAL HIGH (ref 70–99)

## 2020-02-08 LAB — PHOSPHORUS
Phosphorus: 1.3 mg/dL — ABNORMAL LOW (ref 2.5–4.6)
Phosphorus: <1 mg/dL — CL (ref 2.5–4.6)

## 2020-02-08 LAB — MAGNESIUM
Magnesium: 2 mg/dL (ref 1.7–2.4)
Magnesium: 2.1 mg/dL (ref 1.7–2.4)

## 2020-02-08 MED ORDER — SODIUM CHLORIDE 0.9 % IV SOLN: 750.0000 mg | Freq: Two times a day (BID) | INTRAVENOUS | Status: DC

## 2020-02-08 MED ORDER — SODIUM CHLORIDE 0.9 % IV SOLN
1.0000 g | INTRAVENOUS | Status: DC
  Administered 2020-02-08: 16:00:00 1 g via INTRAVENOUS
  Filled 2020-02-08: qty 10
  Filled 2020-02-08: qty 1

## 2020-02-08 MED ORDER — NOREPINEPHRINE 4 MG/250ML-% IV SOLN
0.0000 ug/min | INTRAVENOUS | Status: DC
  Administered 2020-02-09: 5 ug/min via INTRAVENOUS

## 2020-02-08 MED ORDER — SODIUM PHOSPHATES 45 MMOLE/15ML IV SOLN
30.0000 mmol | Freq: Once | INTRAVENOUS | Status: AC
  Administered 2020-02-08: 30 mmol via INTRAVENOUS
  Filled 2020-02-08: qty 10

## 2020-02-08 MED ORDER — FENTANYL CITRATE (PF) 100 MCG/2ML IJ SOLN: 25.0000 ug | INTRAMUSCULAR | Status: DC | PRN

## 2020-02-08 NOTE — Progress Notes (Signed)
Assisted tele visit to patient with family member.  Alexia Dinger P, RN  

## 2020-02-08 NOTE — Progress Notes (Signed)
Subjective: Patient remains intubated.  No leaking from back overnight.  Objective: Vital signs in last 24 hours: Temp:  [96.2 F (35.7 C)-97.5 F (36.4 C)] 97.5 F (36.4 C) (03/06 0400) Pulse Rate:  [76-95] 76 (03/06 0749) Resp:  [16-37] 20 (03/06 0749) BP: (132-166)/(47-72) 148/64 (03/06 0749) SpO2:  [100 %] 100 % (03/06 0749) FiO2 (%):  [30 %-40 %] 30 % (03/06 0749) Weight:  [82.9 kg] 82.9 kg (03/06 0500)  Intake/Output from previous day: 03/05 0701 - 03/06 0700 In: 2179.6 [I.V.:119.8; NG/GT:710; IV Piggyback:1299.8] Out: 750 [Urine:750] Intake/Output this shift: No intake/output data recorded.  Intubated, sedated. Dressing c/d  Lab Results: Recent Labs    02/07/20 0755 02/08/20 0402  WBC 16.8* 20.9*  HGB 8.9* 8.1*  HCT 26.4* 23.2*  PLT 390 377   BMET Recent Labs    02/08/20 0402 02/08/20 0755  NA 125* 126*  K 3.7 3.7  CL 93* 95*  CO2 22 23  GLUCOSE 132* 145*  BUN 9 10  CREATININE 0.42* 0.43*  CALCIUM 8.5* 8.7*    Studies/Results: EEG  Result Date: 03/04/2020 Lora Havens, MD     02/24/2020 12:38 PM Patient Name: Beverly Jimenez MRN: [id number] Epilepsy Attending: Lora Havens Referring Physician/Provider: Etta Quill, PA Date: 02/05/2020 Duration: 24.59 minutes Patient history: 86 year old female presented with altered mental status.  On exam was noted to have right gaze preference.  EEG to evaluate for seizures. Level of alertness: Lethargic AEDs during EEG study: None Technical aspects: This EEG study was done with scalp electrodes positioned according to the 10-20 International system of electrode placement. Electrical activity was acquired at a sampling rate of 500Hz  and reviewed with a high frequency filter of 70Hz  and a low frequency filter of 1Hz . EEG data were recorded continuously and digitally stored. Description: No clear posterior dominant rhythm was seen.  EEG showed continuous generalized 3 to 6 Hz theta-delta slowing.  Hyperventilation and  photic stimulation were not performed due to altered mental status. Abnormality - Continuous slow, generalized IMPRESSION: This study is suggestive of moderate diffuse encephalopathy, nonspecific to etiology. No seizures or epileptiform discharges were seen throughout the recording. Dr. Lorraine Lax was notified. Lora Havens   Portable Chest xray  Result Date: 02/07/2020 CLINICAL DATA:  Intubation. Stroke EXAM: PORTABLE CHEST 1 VIEW COMPARISON:  One-view chest x-ray 02/14/2020 FINDINGS: The heart size is normal. Endotracheal tube terminates 2.1 cm above the carina, in satisfactory position. Atherosclerotic changes are present at the aortic arch. Mild pulmonary vascular congestion is present. Increasing left basilar opacity is noted. IMPRESSION: 1. Satisfactory positioning of endotracheal tube. 2. Increasing left basilar airspace disease. While this likely reflects atelectasis, infection or aspiration is not excluded. Electronically Signed   By: San Morelle M.D.   On: 02/07/2020 07:04   DG Chest Portable 1 View  Result Date: 02/19/2020 CLINICAL DATA:  Endotracheal placement.  Stroke presentation. EXAM: PORTABLE CHEST 1 VIEW COMPARISON:  Earlier same day FINDINGS: Endotracheal tube tip 2 cm above the carina. Orogastric or nasogastric tube enters the stomach with its tip in the fundus. Heart size is normal. Aortic atherosclerosis. Patchy bilateral pulmonary densities could represent developing pneumonia, possibly aspiration. No dense consolidation or collapse. IMPRESSION: Endotracheal tube and gastric tube well positioned. Developing patchy bilateral pulmonary infiltrates could be pneumonia, possibly aspiration. Electronically Signed   By: Nelson Chimes M.D.   On: 02/13/2020 16:03   CT HEAD CODE STROKE WO CONTRAST  Result Date: 02/22/2020 CLINICAL DATA:  Code stroke. Neuro deficit  acute. Rule out stroke. Altered mental status. EXAM: CT HEAD WITHOUT CONTRAST TECHNIQUE: Contiguous axial images were  obtained from the base of the skull through the vertex without intravenous contrast. COMPARISON:  MRI head 03/30/2005 FINDINGS: Brain: Progressive enlargement of the third and lateral ventricles with rounded frontal horns and rounded third ventricle suggesting obstruction. Mild dilatation of the proximal aqueduct. Fourth ventricle normal in size. Findings suggest obstructive hydrocephalus. Mild atrophy. Chronic microvascular ischemic change in the white matter. No acute infarct, hemorrhage, mass. Vascular: Negative for hyperdense vessel Skull: Negative Sinuses/Orbits: Paranasal sinuses clear. Prior sinus surgery involving the left maxillary sinus. Bilateral cataract extraction. Other: None ASPECTS ([place] Stroke Program Early CT Score) - Ganglionic level infarction (caudate, lentiform nuclei, internal capsule, insula, M1-M3 cortex): 7 - Supraganglionic infarction (M4-M6 cortex): 3 Total score (0-10 with 10 being normal): 10 IMPRESSION: 1. No intracranial hemorrhage or acute infarct 2. Mild to moderate hydrocephalus has developed since 2006. No interval studies to determine chronicity. 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 3. ASPECTS is 10 4. These results were called by telephone at the time of interpretation on 02/20/2020 at 10:28 am to provider Aroor, who verbally acknowledged these results. Electronically Signed   By: Franchot Gallo M.D.   On: 02/05/2020 10:29   VAS [place] LOWER EXTREMITY VENOUS (DVT)  Result Date: 02/07/2020  Lower Venous DVTStudy Indications: Edema.  Limitations: Body habitus, poor ultrasound/tissue interface and intubated. Comparison Study: No prior study Performing Technologist: Maudry Mayhew MHA, RDMS, RVT, RDCS  Examination Guidelines: A complete evaluation includes B-mode imaging, spectral Doppler, color Doppler, and power Doppler as needed of all accessible portions of each vessel. Bilateral testing is considered an integral part of a complete examination.  Limited examinations for reoccurring indications may be performed as noted. The reflux portion of the exam is performed with the patient in reverse Trendelenburg.  +---------+---------------+---------+-----------+----------+--------------+ RIGHT    CompressibilityPhasicitySpontaneityPropertiesThrombus Aging +---------+---------------+---------+-----------+----------+--------------+ CFV      Full           Yes      Yes                                 +---------+---------------+---------+-----------+----------+--------------+ SFJ      Full                                                        +---------+---------------+---------+-----------+----------+--------------+ FV Prox  Full                                                        +---------+---------------+---------+-----------+----------+--------------+ FV Mid   Full                                                        +---------+---------------+---------+-----------+----------+--------------+ FV DistalFull                                                        +---------+---------------+---------+-----------+----------+--------------+  PFV      Full                                                        +---------+---------------+---------+-----------+----------+--------------+ POP      Full           Yes      Yes                                 +---------+---------------+---------+-----------+----------+--------------+ PTV      Full                                                        +---------+---------------+---------+-----------+----------+--------------+ PERO     Full                                                        +---------+---------------+---------+-----------+----------+--------------+   +---------+---------------+---------+-----------+----------+--------------+ LEFT     CompressibilityPhasicitySpontaneityPropertiesThrombus Aging  +---------+---------------+---------+-----------+----------+--------------+ CFV      Full           Yes      Yes                                 +---------+---------------+---------+-----------+----------+--------------+ SFJ      Full                                                        +---------+---------------+---------+-----------+----------+--------------+ FV Prox  Full                                                        +---------+---------------+---------+-----------+----------+--------------+ FV Mid   Full                                                        +---------+---------------+---------+-----------+----------+--------------+ FV DistalFull                                                        +---------+---------------+---------+-----------+----------+--------------+ PFV      Full                                                        +---------+---------------+---------+-----------+----------+--------------+  POP      Full           Yes      Yes                                 +---------+---------------+---------+-----------+----------+--------------+ PTV      Full                                                        +---------+---------------+---------+-----------+----------+--------------+ PERO     Full                                                        +---------+---------------+---------+-----------+----------+--------------+     Summary: RIGHT: - There is no evidence of deep vein thrombosis in the lower extremity.  - No cystic structure found in the popliteal fossa.  LEFT: - There is no evidence of deep vein thrombosis in the lower extremity.  - No cystic structure found in the popliteal fossa.  *See table(s) above for measurements and observations. Electronically signed by Monica Martinez MD on 02/07/2020 at 4:38:39 PM.    Final     Assessment/Plan: 86 yo F with respiratory failure, recent lumbar spine surgery 1  week ago.  No evidence of CSF leak.  Continue to monitor wound, reinforce as necessary.  If persistent leak noted, will reinforce with stitching.   Vallarie Mare 02/08/2020, 10:14 AM

## 2020-02-08 NOTE — Plan of Care (Signed)
Discussed in front of patient with daughter over the phone plan of care for the evening, pain management and labs with some teach back displayed by family.  All questions answered at this time

## 2020-02-08 NOTE — Progress Notes (Signed)
In and out cath done obtained 750 yellow to orange clear urine. Will continue to monitor

## 2020-02-08 NOTE — Progress Notes (Signed)
Pts phos this AM was 1.3.  CCM MD aware.

## 2020-02-08 NOTE — Progress Notes (Signed)
eLink Physician-Brief Progress Note Patient Name: Beverly Jimenez DOB: 28-May-1925 MRN: [id number]   Date of Service  02/08/2020  HPI/Events of Note  Notified of oligoanuria Bladder scan with 719 cc  eICU Interventions  Ordered in and out cath     Intervention Category Intermediate Interventions: Oliguria - evaluation and management  Judd Lien 02/08/2020, 1:42 AM

## 2020-02-08 NOTE — Progress Notes (Signed)
Pt has had multiple bladder scans over 400 and  2 straight caths for retention in past 24 hours.  Most recent bladder scan is 408.  Will place foley per protocol.

## 2020-02-08 NOTE — Progress Notes (Addendum)
eLink Physician-Brief Progress Note Patient Name: NAVEYA PESHEK DOB: [birth year] MRN: [id number]   Date of Service  02/08/2020  HPI/Events of Note  Red alert for unstable bradycardia after being given labetalol for hypertension and tachycardia which she has previously tolerated. Given Epi and atropine with resolution of bradycardia and BP eventually stabilized to 101/64 after a transient episode of hypertension  eICU Interventions   Hold beta blockers for now  Check digoxin level and EKG  Ongoing phos correction, sodium stable     Intervention Category Major Interventions: Arrhythmia - evaluation and management;Hypotension - evaluation and management  Judd Lien 02/08/2020, 11:35 PM

## 2020-02-08 NOTE — Progress Notes (Signed)
.   NAMETREMEKA FILTZ, MRN:  [id number], DOB:  [birth year], LOS: 2 ADMISSION DATE:  02/19/2020, CONSULTATION DATE:  02/21/2020 REFERRING MD:  Dr. Sedonia Small, CHIEF COMPLAINT:  AMS with respiratory distress    Brief History   86yo female presented from local SNF with AMS, found to be significantly hyponatremic with a sodium of 116. PCCM consulted for admission.   History of present illness   Cherlynn Velie Is a 86yo female with recent (2/23 - 2/25) hospitalization for posterior lumbar fusion after which she was discharged to SNF for rehab who presented with complaint of AMS. PMX includes lumbar stenosis, GERD, Depression, anemia, anxiety, arthritis, HTN, and HLD. History obtained from son as patient is currently encephalopathic. Per son patient originally did well post surgery with only mild pain post surgery for which she was was prescribed Valium and Oxycodone. Son reports over the last few days his mom was  recently placed on 2L [place] and that her back incision was recently noted to be draining.   On arrival vital signs appeared stable but over the course of her ED stay she became tachypnic, hypertensive, and tachycardiac with worsening mentation. Due to right gaze preference code stroke called but Head CT negative. Labwork significant for severe hyponatremia with sodium of 116, CL 78, WBC 21.4, hgb 10.3, and lactic within normal of 1.7. PCCM consulted for further management and admission.   Past Medical History  GERD Depression Anemia Anxiety Arthritis HTN  HLD  Significant Hospital Events   Admitted and intubated 3/4  Consults:  Neurosurgery   Procedures:    Significant Diagnostic Tests:  CXR 3/4 > Bibasilar atelectasis   Head CT 3/4 > 1. No intracranial hemorrhage or acute infarct 2. Mild to moderate hydrocephalus has developed since 2006. No interval studies to determine chronicity. 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  Micro Data:  COVID 3/4 > Blood  culture 3/4 > Urine 3/4 >  Antimicrobials:  Vanc 3/4>3/5 Aztreonam 3/4>3/5   Interim history/subjective:  Transferred from ED to ICU last night. Remains on mechanical ventilation and sedation  Objective   Blood pressure (!) 141/56, pulse 98, temperature 98.5 F (36.9 C), temperature source Oral, resp. rate (!) 33, height 5\' 4"  (1.626 m), weight 82.9 kg, SpO2 100 %.    Vent Mode: PSV;CPAP FiO2 (%):  [30 %-40 %] 30 % Set Rate:  [20 bmp] 20 bmp Vt Set:  [430 mL] 430 mL PEEP:  [5 cmH20] 5 cmH20 Pressure Support:  [10 [id number] cmH20] 10 cmH20 Plateau Pressure:  [17 cmH20-19 cmH20] 17 cmH20   Intake/Output Summary (Last 24 hours) at 02/08/2020 1456 Last data filed at 02/08/2020 1400 Gross per 24 hour  Intake 1972.61 ml  Output 1350 ml  Net 622.61 ml   Filed Weights   02/25/2020 1700 02/07/20 0435 02/08/20 0500  Weight: 84 kg 83.4 kg 82.9 kg   Physical Exam: General: Chronically ill-appearing and elderly female, sedated HENT: [place], AT, ETT in place Eyes: EOMI, no scleral icterus Respiratory: Coarse breath sounds bilaterally. Cardiovascular: RRR, -M/R/G, no JVD GI: BS+, soft, nontender Extremities: + Edema of hands,-tenderness Skin: Lower back sutures with fluctuance or erythema Neuro: [facility] Problem list     Assessment & Plan:   Critically ill due to acute respiratory failure secondary to presumed aspiration pneumonitis/pneumonia requiring mechanical ventilation. History of asthma. Tolerating SBT but mental status precludes extubation - daily SBT  Acute hypovolemic hyponatremia -Correct slowly, appears euvolemic at present  Acute  metabolic encephalopathy secondary to hyponatremia, oversedation Hydrocephalus, thought to be chronic per Neuro and not contributing to acute presentation At baseline patient is alert and oriented on admission she is only responsive to pain Head CT negative on admission. EEG negative for seizures Discharged on home pain  medications post-back surgery - Allow sedation washout. - Levaquin can contribute to sedation. - Cefuroxime for 5 days.   Hypertensive urgency  -Med rec reveals no baseline antihypertensives  P: Start amlodipine Hold ARB for now PRN IV hydralazine Closely monitor   Hypokalemia P:  Repleted  Recent lumbar fusion  P Management per neurosurgery  Supportive care    Best practice:  Diet: tolerating tube feeds Pain/Anxiety/Delirium protocol (if indicated): prn only  VAP protocol (if indicated): bundle in place DVT prophylaxis: Subq heparin  GI prophylaxis: PPI Glucose control: Monitor  Mobility: Bedrest  Code Status: Full Family Communication: Son updated at bedside on 3/6. Disposition: ICU   Labs   CBC: Recent Labs  Lab 02/19/2020 0940 02/09/2020 1009 02/09/2020 1019 02/13/2020 1411 02/25/2020 1646 02/07/20 0335 02/07/20 0755 02/08/20 0402  WBC 21.4*  --   --  11.2*  --   --  16.8* 20.9*  NEUTROABS  --  19.8*  --   --   --   --   --   --   HGB 10.3*  --    < > 10.0* 10.2* 9.5* 8.9* 8.1*  HCT 30.8*  --    < > 30.4* 30.0* 28.0* 26.4* 23.2*  MCV 80.8  --   --  80.6  --   --  80.0 77.9*  PLT 502*  --   --  441*  --   --  390 377   < > = values in this interval not displayed.    Basic Metabolic Panel: Recent Labs  Lab 02/07/20 0755 02/07/20 0755 02/07/20 0959 02/07/20 1347 02/07/20 2035 02/08/20 0012 02/08/20 0402 02/08/20 0755 02/08/20 1159  NA 123*   < > 123*   < > 122* 124* 125* 126* 125*  K 3.2*   < > 3.5   < > 3.1* 3.5 3.7 3.7 3.8  CL 89*   < > 90*   < > 94* 95* 93* 95* 95*  CO2 22   < > 20*   < > 22 22 22 23 23   GLUCOSE 108*   < > 90   < > 123* 139* 132* 145* 137*  BUN 8   < > 9   < > 9 9 9 10 10   CREATININE 0.48   < > 0.50   < > 0.41* 0.46 0.42* 0.43* 0.43*  CALCIUM 8.4*   < > 8.2*   < > 8.1* 8.2* 8.5* 8.7* 8.5*  MG 1.5*  --  1.6*  --  1.7  --  2.0  --   --   PHOS 2.7  --  2.7  --  1.8*  --  1.3*  --   --    < > = values in this interval not  displayed.   GFR: Estimated Creatinine Clearance: 46.8 mL/min (A) (by C-G formula based on SCr of 0.43 mg/dL (L)). Recent Labs  Lab 02/22/2020 0940 02/24/2020 1411 02/07/20 0755 02/08/20 0402  WBC 21.4* 11.2* 16.8* 20.9*  LATICACIDVEN 1.7  --   --   --     Liver Function Tests: Recent Labs  Lab 02/15/2020 0940  AST 27  ALT 41  ALKPHOS 63  BILITOT 1.5*  PROT 7.0  ALBUMIN 3.2*   Recent Labs  Lab 02/14/2020 0940  LIPASE 22   No results for input(s): AMMONIA in the last 168 hours.  ABG    Component Value Date/Time   PHART 7.470 (H) 02/07/2020 0335   PCO2ART 30.2 (L) 02/07/2020 0335   PO2ART 65.0 (L) 02/07/2020 0335   HCO3 22.1 02/07/2020 0335   TCO2 23 02/07/2020 0335   ACIDBASEDEF 1.0 02/07/2020 0335   O2SAT 94.0 02/07/2020 0335     Coagulation Profile: Recent Labs  Lab 02/23/2020 1009  INR 1.0    Cardiac Enzymes: No results for input(s): CKTOTAL, CKMB, CKMBINDEX, TROPONINI in the last 168 hours.  HbA1C: Hgb A1c MFr Bld  Date/Time Value Ref Range Status  02/19/2020 03:50 PM 5.3 4.8 - 5.6 % Final    Comment:    (NOTE) Pre diabetes:          5.7%-6.4% Diabetes:              >6.4% Glycemic control for   <7.0% adults with diabetes     CBG: Recent Labs  Lab 02/07/20 1954 02/07/20 2318 02/08/20 0430 02/08/20 0759 02/08/20 1138  GLUCAP 130* 135* 129* 147* 143*    CRITICAL CARE Performed by: Kipp Brood   Total critical care time: 30 minutes  Critical care time was exclusive of separately billable procedures and treating other patients.  Critical care was necessary to treat or prevent imminent or life-threatening deterioration.  Critical care was time spent personally by me on the following activities: development of treatment plan with patient and/or surrogate as well as nursing, discussions with consultants, evaluation of patient's response to treatment, examination of patient, obtaining history from patient or surrogate, ordering and performing  treatments and interventions, ordering and review of laboratory studies, ordering and review of radiographic studies, pulse oximetry, re-evaluation of patient's condition and participation in multidisciplinary rounds.  Kipp Brood, MD [facility] ICU Physician [place]  Pager: [phone] Mobile: [phone] After hours: [phone].

## 2020-02-08 NOTE — Progress Notes (Signed)
Video call with daughter, Hoyle Sauer through e-mail [email or web address]

## 2020-02-08 NOTE — Progress Notes (Signed)
Received call from lab, pts phos is <1.  Spoke w/ CCM MD to relay.

## 2020-02-08 NOTE — Progress Notes (Signed)
Informed Gretchen on elink no further orders at this time, dr will be rounding this morning and for am nurse to monitor.

## 2020-02-09 ENCOUNTER — Inpatient Hospital Stay (HOSPITAL_COMMUNITY): Payer: Medicare Other

## 2020-02-09 DIAGNOSIS — R001 Bradycardia, unspecified: Secondary | ICD-10-CM

## 2020-02-09 DIAGNOSIS — Z452 Encounter for adjustment and management of vascular access device: Secondary | ICD-10-CM

## 2020-02-09 LAB — COMPREHENSIVE METABOLIC PANEL
ALT: 16 U/L (ref 0–44)
AST: 18 U/L (ref 15–41)
Albumin: 1.6 g/dL — ABNORMAL LOW (ref 3.5–5.0)
Alkaline Phosphatase: 53 U/L (ref 38–126)
Anion gap: 9 (ref 5–15)
BUN: 16 mg/dL (ref 8–23)
CO2: 20 mmol/L — ABNORMAL LOW (ref 22–32)
Calcium: 8.2 mg/dL — ABNORMAL LOW (ref 8.9–10.3)
Chloride: 98 mmol/L (ref 98–111)
Creatinine, Ser: 0.7 mg/dL (ref 0.44–1.00)
GFR calc Af Amer: >60 mL/min (ref >60)
GFR calc non Af Amer: >60 mL/min (ref >60)
Glucose, Bld: 124 mg/dL — ABNORMAL HIGH (ref 70–99)
Potassium: 3.6 mmol/L (ref 3.5–5.1)
Sodium: 127 mmol/L — ABNORMAL LOW (ref 135–145)
Total Bilirubin: 0.4 mg/dL (ref 0.3–1.2)
Total Protein: 4.4 g/dL — ABNORMAL LOW (ref 6.5–8.1)

## 2020-02-09 LAB — TROPONIN I (HIGH SENSITIVITY)
Troponin I (High Sensitivity): 38 ng/L — ABNORMAL HIGH (ref <18)
Troponin I (High Sensitivity): 117 ng/L (ref <18)

## 2020-02-09 LAB — CBC WITH DIFFERENTIAL/PLATELET
Abs Immature Granulocytes: 0.15 10*3/uL — ABNORMAL HIGH (ref 0.00–0.07)
Basophils Absolute: 0 10*3/uL (ref 0.0–0.1)
Basophils Relative: 0 %
Eosinophils Absolute: 0.2 10*3/uL (ref 0.0–0.5)
Eosinophils Relative: 1 %
HCT: 20.5 % — ABNORMAL LOW (ref 36.0–46.0)
Hemoglobin: 7.1 g/dL — ABNORMAL LOW (ref 12.0–15.0)
Immature Granulocytes: 1 %
Lymphocytes Relative: 3 %
Lymphs Abs: 0.7 10*3/uL (ref 0.7–4.0)
MCH: 27.4 pg (ref 26.0–34.0)
MCHC: 34.6 g/dL (ref 30.0–36.0)
MCV: 79.2 fL — ABNORMAL LOW (ref 80.0–100.0)
Monocytes Absolute: 1.1 10*3/uL — ABNORMAL HIGH (ref 0.1–1.0)
Monocytes Relative: 4 %
Neutro Abs: 22 10*3/uL — ABNORMAL HIGH (ref 1.7–7.7)
Neutrophils Relative %: 91 %
Platelets: 409 10*3/uL — ABNORMAL HIGH (ref 150–400)
RBC: 2.59 MIL/uL — ABNORMAL LOW (ref 3.87–5.11)
RDW: 13.8 % (ref 11.5–15.5)
Smear Review: ADEQUATE
WBC: 24.1 10*3/uL — ABNORMAL HIGH (ref 4.0–10.5)
nRBC: 0 % (ref 0.0–0.2)

## 2020-02-09 LAB — MAGNESIUM: Magnesium: 2.4 mg/dL (ref 1.7–2.4)

## 2020-02-09 LAB — GLUCOSE, CAPILLARY
Glucose-Capillary: 124 mg/dL — ABNORMAL HIGH (ref 70–99)
Glucose-Capillary: 128 mg/dL — ABNORMAL HIGH (ref 70–99)
Glucose-Capillary: 149 mg/dL — ABNORMAL HIGH (ref 70–99)
Glucose-Capillary: 151 mg/dL — ABNORMAL HIGH (ref 70–99)
Glucose-Capillary: 153 mg/dL — ABNORMAL HIGH (ref 70–99)
Glucose-Capillary: 186 mg/dL — ABNORMAL HIGH (ref 70–99)

## 2020-02-09 LAB — PHOSPHORUS
Phosphorus: 2.5 mg/dL (ref 2.5–4.6)
Phosphorus: 2.2 mg/dL — ABNORMAL LOW (ref 2.5–4.6)

## 2020-02-09 LAB — CBC
HCT: 20 % — ABNORMAL LOW (ref 36.0–46.0)
Hemoglobin: 6.5 g/dL — CL (ref 12.0–15.0)
MCH: 27.1 pg (ref 26.0–34.0)
MCHC: 32.5 g/dL (ref 30.0–36.0)
MCV: 83.3 fL (ref 80.0–100.0)
Platelets: 336 10*3/uL (ref 150–400)
RBC: 2.4 MIL/uL — ABNORMAL LOW (ref 3.87–5.11)
RDW: 14.5 % (ref 11.5–15.5)
WBC: 19.6 10*3/uL — ABNORMAL HIGH (ref 4.0–10.5)
nRBC: 0.1 % (ref 0.0–0.2)

## 2020-02-09 LAB — DIGOXIN LEVEL: Digoxin Level: <0.2 ng/mL — ABNORMAL LOW (ref 0.8–2.0)

## 2020-02-09 LAB — SODIUM
Sodium: 134 mmol/L — ABNORMAL LOW (ref 135–145)
Sodium: 142 mmol/L (ref 135–145)
Sodium: 146 mmol/L — ABNORMAL HIGH (ref 135–145)

## 2020-02-09 LAB — PREPARE RBC (CROSSMATCH)

## 2020-02-09 LAB — CORTISOL: Cortisol, Plasma: 18.7 ug/dL

## 2020-02-09 MED ORDER — EPINEPHRINE 1 MG/10ML IJ SOSY
0.1000 mg | PREFILLED_SYRINGE | Freq: Once | INTRAMUSCULAR | Status: AC
  Administered 2020-02-09: 0.1 mg via INTRAVENOUS

## 2020-02-09 MED ORDER — EPINEPHRINE PF 1 MG/ML IJ SOLN: 1.0000 mg | Freq: Once | INTRAMUSCULAR | Status: DC

## 2020-02-09 MED ORDER — CALCIUM GLUCONATE-NACL 2-0.675 GM/100ML-% IV SOLN
2.0000 g | INTRAVENOUS | Status: DC
  Filled 2020-02-09: qty 100

## 2020-02-09 MED ORDER — GLUCAGON HCL RDNA (DIAGNOSTIC) 1 MG IJ SOLR
3.0000 mg/h | INTRAVENOUS | Status: DC
  Filled 2020-02-09: qty 5

## 2020-02-09 MED ORDER — SODIUM CHLORIDE 0.9 % IV SOLN: 2.0000 g | Freq: Three times a day (TID) | INTRAVENOUS | Status: DC

## 2020-02-09 MED ORDER — ATROPINE SULFATE 1 MG/10ML IJ SOSY
1.0000 mg | PREFILLED_SYRINGE | Freq: Once | INTRAMUSCULAR | Status: AC
  Administered 2020-02-08: 1 mg via INTRAVENOUS

## 2020-02-09 MED ORDER — EPINEPHRINE HCL 5 MG/250ML IV SOLN IN NS
0.5000 ug/min | INTRAVENOUS | Status: DC
  Administered 2020-02-09: 16 ug/min via INTRAVENOUS
  Administered 2020-02-09: 12 ug/min via INTRAVENOUS
  Administered 2020-02-09: 13 ug/min via INTRAVENOUS
  Administered 2020-02-09: 20:00:00 15 ug/min via INTRAVENOUS
  Administered 2020-02-09: 17 ug/min via INTRAVENOUS
  Administered 2020-02-09: 01:00:00 5 ug/min via INTRAVENOUS
  Administered 2020-02-10: 20 ug/min via INTRAVENOUS
  Administered 2020-02-10: 18 ug/min via INTRAVENOUS
  Administered 2020-02-10: 20 ug/min via INTRAVENOUS
  Administered 2020-02-10 (×2): 17 ug/min via INTRAVENOUS
  Filled 2020-02-09 (×11): qty 250

## 2020-02-09 MED ORDER — SODIUM BICARBONATE 8.4 % IV SOLN
50.0000 meq | Freq: Once | INTRAVENOUS | Status: AC
  Administered 2020-02-09: 01:00:00 50 meq via INTRAVENOUS

## 2020-02-09 MED ORDER — DEXTROSE 5 % IV SOLN: INTRAVENOUS | Status: DC

## 2020-02-09 MED ORDER — VANCOMYCIN HCL 750 MG/150ML IV SOLN
750.0000 mg | Freq: Two times a day (BID) | INTRAVENOUS | Status: DC
  Administered 2020-02-09 – 2020-02-10 (×2): 750 mg via INTRAVENOUS
  Filled 2020-02-09 (×4): qty 150

## 2020-02-09 MED ORDER — SODIUM CHLORIDE 3 % IV SOLN
INTRAVENOUS | Status: DC
  Administered 2020-02-09: 08:00:00 50 mL/h via INTRAVENOUS
  Administered 2020-02-09: 10 mL/h via INTRAVENOUS
  Filled 2020-02-09 (×3): qty 500

## 2020-02-09 MED ORDER — ATROPINE SULFATE 1 MG/ML IJ SOLN: 1.0000 mg | Freq: Once | INTRAMUSCULAR | Status: DC

## 2020-02-09 MED ORDER — VANCOMYCIN HCL 1500 MG/300ML IV SOLN
1500.0000 mg | Freq: Once | INTRAVENOUS | Status: AC
  Administered 2020-02-09: 1500 mg via INTRAVENOUS
  Filled 2020-02-09: qty 300

## 2020-02-09 MED ORDER — VANCOMYCIN HCL 1500 MG/300ML IV SOLN: 1500.0000 mg | Freq: Two times a day (BID) | INTRAVENOUS | Status: DC

## 2020-02-09 MED ORDER — SODIUM CHLORIDE 0.9 % IV SOLN
2.0000 g | Freq: Two times a day (BID) | INTRAVENOUS | Status: DC
  Administered 2020-02-09 (×2): 2 g via INTRAVENOUS
  Filled 2020-02-09 (×2): qty 2

## 2020-02-09 MED ORDER — ATROPINE SULFATE 1 MG/10ML IJ SOSY
1.0000 mg | PREFILLED_SYRINGE | Freq: Once | INTRAMUSCULAR | Status: AC
  Administered 2020-02-09: 1 mg via INTRAVENOUS

## 2020-02-09 MED ORDER — GLUCAGON HCL RDNA (DIAGNOSTIC) 1 MG IJ SOLR: 1.0000 mg | Freq: Once | INTRAMUSCULAR | Status: AC | PRN

## 2020-02-09 MED ORDER — GLUCAGON HCL RDNA (DIAGNOSTIC) 1 MG IJ SOLR
INTRAMUSCULAR | Status: AC
  Filled 2020-02-09: qty 1

## 2020-02-09 MED ORDER — GLUCAGON HCL RDNA (DIAGNOSTIC) 1 MG IJ SOLR
5.0000 mg/h | Freq: Once | INTRAVENOUS | Status: DC
  Filled 2020-02-09: qty 5

## 2020-02-09 MED ORDER — EPINEPHRINE 1 MG/10ML IJ SOSY
1.0000 mg | PREFILLED_SYRINGE | Freq: Once | INTRAMUSCULAR | Status: AC
  Administered 2020-02-08: 1 mg via INTRAVENOUS

## 2020-02-09 MED ORDER — SODIUM CHLORIDE 0.9% IV SOLUTION: Freq: Once | INTRAVENOUS | Status: AC

## 2020-02-09 MED ORDER — CALCIUM CHLORIDE 10 % IV SOLN
1.0000 g | Freq: Once | INTRAVENOUS | Status: AC
  Administered 2020-02-09: 1 g via INTRAVENOUS
  Filled 2020-02-09: qty 10

## 2020-02-09 MED ORDER — EPINEPHRINE 1 MG/10ML IJ SOSY
1.0000 mg | PREFILLED_SYRINGE | Freq: Once | INTRAMUSCULAR | Status: AC
  Administered 2020-02-09: 0.5 mg via INTRAVENOUS

## 2020-02-09 NOTE — Plan of Care (Signed)
Discussed in front of patient with daughter on the phone plan of care for the evening, labs and blood transfusion being completed with some teach back displayed.  Patient may be bleeding from stools and will try to get an occult sample and let family know.  Answered all questions at this time.

## 2020-02-09 NOTE — Progress Notes (Signed)
[facility] Progress Note Patient Name: Beverly Jimenez DOB: 1925-08-10 MRN: [id number]   Date of Service  02/09/2020  HPI/Events of Note  Dark Stool - Resquest to send stool for occult blood. Request for PO4--- and Mg++ levels in AM.  eICU Interventions  Will order: 1. Stool for occult blood. 2. Mg++ and PO4--- levels in AM.     Intervention Category Major Interventions: Other:  Lysle Dingwall 02/09/2020, 11:42 PM

## 2020-02-09 NOTE — Significant Event (Addendum)
PCCM OVERNIGHT   Called to bedside by E link due to hypotension following Labetalol push On evaluation 82M RN team at bedside pt had received Atropine with no improvement  Responded to epi push and was started on Levophed but continued to have increasing pressor requirements  Reviewed preceding events with RN Pt not on Digoxin, only hydralazine and Labetalol No previous dose of Labetalol charted but per RN,  daytime RN had given Labetalol with no event Pt received Labetalol due to increased HR and BP  Elink initially gave Epi and Atropine and BP stabilized but then hypotension and bradycardia occurred again  INTERVENTIONS Called for clinical pharmacist to bedside -> review med profile and identify issues, provide meds for resuscitation in a peri-arrest scenario  Gave pt Calcium chloride 1 g  Then glucagon -> IVP no response 1 amp Bicarb --> had some improvement in her BP but no significant change to HR Tried Epi (push dose) responded well Switched pt to Epi gtt discontinued Levo PA Gleason placed CVC in Left IJ and radial A line  After stabilizing BP and HR performed thorough physical exam Neuro exam concerning: no withdrawal from painful stimuli  No corneal no gag no pupillary response and not on sedation Last Na 128, Phos was <1 and was repleted Lytes being checked and corrected Of note she had a recent lumbar fusion and Neurosurgery is following Recommend CTH w/o contrast  Signed Dr Seward Carol Pulmonary Critical Care Locums

## 2020-02-09 NOTE — Progress Notes (Signed)
.   Beverly Jimenez, MRN:  [id number], DOB:  02-05-1925, LOS: 3 ADMISSION DATE:  02/12/2020, CONSULTATION DATE:  02/05/2020 REFERRING MD:  Dr. Sedonia Small, CHIEF COMPLAINT:  AMS with respiratory distress    Brief History   86yo female presented from local SNF with AMS, found to be significantly hyponatremic with a sodium of 116. PCCM consulted for admission.   History of present illness   Beverly Jimenez Is a 86yo female with recent (2/23 - 2/25) hospitalization for posterior lumbar fusion after which she was discharged to SNF for rehab who presented with complaint of AMS. PMX includes lumbar stenosis, GERD, Depression, anemia, anxiety, arthritis, HTN, and HLD. History obtained from son as patient is currently encephalopathic. Per son patient originally did well post surgery with only mild pain post surgery for which she was was prescribed Valium and Oxycodone. Son reports over the last few days his mom was  recently placed on 2L [place] and that her back incision was recently noted to be draining.   On arrival vital signs appeared stable but over the course of her ED stay she became tachypnic, hypertensive, and tachycardiac with worsening mentation. Due to right gaze preference code stroke called but Head CT negative. Labwork significant for severe hyponatremia with sodium of 116, CL 78, WBC 21.4, hgb 10.3, and lactic within normal of 1.7. PCCM consulted for further management and admission.   Past Medical History  GERD Depression Anemia Anxiety Arthritis HTN  HLD  Significant Hospital Events   Admitted and intubated 3/4  Consults:  Neurosurgery   Procedures:    Significant Diagnostic Tests:  CXR 3/4 > Bibasilar atelectasis   Head CT 3/4 > 1. No intracranial hemorrhage or acute infarct 2. Mild to moderate hydrocephalus has developed since 2006. No interval studies to determine chronicity. 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  Micro Data:  COVID 3/4 > Blood  culture 3/4 > Urine 3/4 >  Antimicrobials:  Vanc 3/4>3/5 Aztreonam 3/4>3/5   Interim history/subjective:  Episode of hypotension with bradycardia, prearrest last night.  Now on epinephrine infusion.  Objective   Blood pressure (!) 119/40, pulse 80, temperature (!) 96.7 F (35.9 C), temperature source Axillary, resp. rate 20, height 5\' 4"  (1.626 m), weight 83.6 kg, SpO2 100 %.    Vent Mode: PRVC FiO2 (%):  [30 %-40 %] 40 % Set Rate:  [20 bmp] 20 bmp Vt Set:  [430 mL] 430 mL PEEP:  [5 cmH20] 5 cmH20 Plateau Pressure:  [17 cmH20-19 cmH20] 18 cmH20   Intake/Output Summary (Last 24 hours) at 02/09/2020 1644 Last data filed at 02/09/2020 1600 Gross per 24 hour  Intake 3003.57 ml  Output 2460 ml  Net 543.57 ml   Filed Weights   02/07/20 0435 02/08/20 0500 02/09/20 0336  Weight: 83.4 kg 82.9 kg 83.6 kg   Physical Exam: General: Chronically ill-appearing and elderly female, off all sedation HENT: [place], AT, ETT in place Eyes: Pupils midposition, no doll's eyes, no reaction to light., no scleral icterus Respiratory: Coarse breath sounds bilaterally. Cardiovascular: RRR, -M/R/G, no JVD GI: BS+, soft, nontender Extremities: + Edema of hands,-tenderness Skin: Lower back sutures with fluctuance or erythema Neuro: No response to painful stimuli.  Resolved Hospital Problem list     Assessment & Plan:   Critically ill due to acute respiratory failure secondary to presumed aspiration pneumonitis/pneumonia requiring mechanical ventilation. History of asthma. Tolerating SBT but mental status precludes extubation -Continue full support.  Acute hypovolemic hyponatremia -Correct slowly,  appears euvolemic at present  Critically ill due to acute hydrocephalus likely secondary to ventriculitis with evidence of pus in the cerebral ventricular system.  Progressive cerebral edema. Seen by neurosurgery.  Prognosis is poor.  No plans for surgical intervention per neurosurgery and discussion with  family. -Time-limited trial of antibiotics.  Should see some improvement within 48 hours. -Have advised family that if no improvement at that point a transition to comfort care should be considered at that time.  Critically ill due to shock requiring titration of epinephrine infusion. Likely secondary to worsening cerebral edema and herniation. -Titrate to keep systolic blood pressure greater than 110.  Hypokalemia P:  Repleted  Recent lumbar fusion  P Management per neurosurgery  Supportive care    Best practice:  Diet: tolerating tube feeds Pain/Anxiety/Delirium protocol (if indicated): prn only  VAP protocol (if indicated): bundle in place DVT prophylaxis: Subq heparin  GI prophylaxis: PPI Glucose control: Monitor  Mobility: Bedrest  Code Status: Full Family Communication: Son updated by phone today he understands the gravity of the situation and the limited therapeutic options.  Have agreed to trial of medical therapy.  If further decline would advise family and recommend transition to comfort care at that time. Disposition: ICU   Labs   CBC: Recent Labs  Lab 02/25/2020 0940 02/14/2020 1009 02/04/2020 1019 02/12/2020 1411 02/23/2020 1411 02/09/2020 1646 02/07/20 0335 02/07/20 0755 02/08/20 0402 02/09/20 0151  WBC 21.4*  --   --  11.2*  --   --   --  16.8* 20.9* 24.1*  NEUTROABS  --  19.8*  --   --   --   --   --   --   --  22.0*  HGB 10.3*  --    < > 10.0*   < > 10.2* 9.5* 8.9* 8.1* 7.1*  HCT 30.8*  --    < > 30.4*   < > 30.0* 28.0* 26.4* 23.2* 20.5*  MCV 80.8  --   --  80.6  --   --   --  80.0 77.9* 79.2*  PLT 502*  --   --  441*  --   --   --  390 377 409*   < > = values in this interval not displayed.    Basic Metabolic Panel: Recent Labs  Lab 02/07/20 0959 02/07/20 1347 02/07/20 2035 02/08/20 0012 02/08/20 0402 02/08/20 0402 02/08/20 0755 02/08/20 0755 02/08/20 1159 02/08/20 1159 02/08/20 1646 02/08/20 2124 02/09/20 0016 02/09/20 0800 02/09/20 1428    NA 123*   < > 122*   < > 125*   < > 126*   < > 125*   < > 128* 128* 127* 134* 142  K 3.5   < > 3.1*   < > 3.7   < > 3.7  --  3.8  --  3.7 3.6 3.6  --   --   CL 90*   < > 94*   < > 93*   < > 95*  --  95*  --  96* 95* 98  --   --   CO2 20*   < > 22   < > 22   < > 23  --  23  --  23 23 20*  --   --   GLUCOSE 90   < > 123*   < > 132*   < > 145*  --  137*  --  115* 133* 124*  --   --   BUN  9   < > 9   < > 9   < > 10  --  10  --  12 12 16   --   --   CREATININE 0.50   < > 0.41*   < > 0.42*   < > 0.43*  --  0.43*  --  0.46 0.43* 0.70  --   --   CALCIUM 8.2*   < > 8.1*   < > 8.5*   < > 8.7*  --  8.5*  --  8.6* 8.8* 8.2*  --   --   MG 1.6*  --  1.7  --  2.0  --   --   --   --   --  2.1  --   --   --  2.4  PHOS 2.7   < > 1.8*  --  1.3*  --   --   --   --   --  <1.0*  --  2.5  --  2.2*   < > = values in this interval not displayed.   GFR: Estimated Creatinine Clearance: 47 mL/min (by C-G formula based on SCr of 0.7 mg/dL). Recent Labs  Lab 02/20/2020 0940 02/09/2020 0940 03/03/2020 1411 02/07/20 0755 02/08/20 0402 02/09/20 0151  WBC 21.4*   < > 11.2* 16.8* 20.9* 24.1*  LATICACIDVEN 1.7  --   --   --   --   --    < > = values in this interval not displayed.    Liver Function Tests: Recent Labs  Lab 02/29/2020 0940 02/09/20 0016  AST 27 18  ALT 41 16  ALKPHOS 63 53  BILITOT 1.5* 0.4  PROT 7.0 4.4*  ALBUMIN 3.2* 1.6*   Recent Labs  Lab 02/18/2020 0940  LIPASE 22   No results for input(s): AMMONIA in the last 168 hours.  ABG    Component Value Date/Time   PHART 7.470 (H) 02/07/2020 0335   PCO2ART 30.2 (L) 02/07/2020 0335   PO2ART 65.0 (L) 02/07/2020 0335   HCO3 22.1 02/07/2020 0335   TCO2 23 02/07/2020 0335   ACIDBASEDEF 1.0 02/07/2020 0335   O2SAT 94.0 02/07/2020 0335     Coagulation Profile: Recent Labs  Lab 02/13/2020 1009  INR 1.0    Cardiac Enzymes: No results for input(s): CKTOTAL, CKMB, CKMBINDEX, TROPONINI in the last 168 hours.  HbA1C: Hgb A1c MFr Bld  Date/Time  Value Ref Range Status  02/28/2020 03:50 PM 5.3 4.8 - 5.6 % Final    Comment:    (NOTE) Pre diabetes:          5.7%-6.4% Diabetes:              >6.4% Glycemic control for   <7.0% adults with diabetes     CBG: Recent Labs  Lab 02/08/20 2334 02/09/20 0342 02/09/20 0724 02/09/20 1138 02/09/20 1531  GLUCAP 124* 151* 186* 153* 124*    CRITICAL CARE Performed by: Kipp Brood   Total critical care time: 45 minutes  Critical care time was exclusive of separately billable procedures and treating other patients.  Critical care was necessary to treat or prevent imminent or life-threatening deterioration.  Critical care was time spent personally by me on the following activities: development of treatment plan with patient and/or surrogate as well as nursing, discussions with consultants, evaluation of patient's response to treatment, examination of patient, obtaining history from patient or surrogate, ordering and performing treatments and interventions, ordering and review of laboratory studies, ordering and review of  radiographic studies, pulse oximetry, re-evaluation of patient's condition and participation in multidisciplinary rounds.  Kipp Brood, MD [facility] ICU Physician [place]  Pager: [phone] Mobile: [phone] After hours: [phone].

## 2020-02-09 NOTE — Progress Notes (Signed)
eLink Physician-Brief Progress Note Patient Name: LAMYIAH OMOTO DOB: May 01, 1925 MRN: [id number]   Date of Service  02/09/2020  HPI/Events of Note  Notified of hypotension SBP 40s. HR now 60s.  eICU Interventions   Ordered to give another 250 NS bolus  A total of 1 mg atropine given without change in HR  BP improved to 107/88  Stat labs being drawn now  EKG done NSR  Bedside CCM team on their way     Intervention Category Major Interventions: Hypotension - evaluation and management;Arrhythmia - evaluation and management  Judd Lien 02/09/2020, 12:01 AM

## 2020-02-09 NOTE — Progress Notes (Signed)
Received call from lab, pts Na increased from 134 to 142.  Pts phos is 2.2.  Spoke w/ CCM MD to relay all.  Received verbal order to decrease hypertonic saline gtt.

## 2020-02-09 NOTE — Procedures (Signed)
Central Venous Catheter Insertion Procedure Note ANNJEANETTE PEHL [id number] 05/14/1925  Procedure: Insertion of Central Venous Catheter Indications: Assessment of intravascular volume, Drug and/or fluid administration and Frequent blood sampling  Procedure Details Consent: Unable to obtain consent because of emergent medical necessity. Time Out: Verified patient identification, verified procedure, site/side was marked, verified correct patient position, special equipment/implants available, medications/allergies/relevent history reviewed, required imaging and test results available.  Performed   Maximum sterile technique was used including antiseptics, cap, gloves, gown, hand hygiene, mask and sheet. Skin prep: Chlorhexidine; local anesthetic administered A antimicrobial bonded/coated triple lumen catheter was placed in the left internal jugular vein using the Seldinger technique.  Evaluation Blood flow good Complications: No apparent complications Patient did tolerate procedure well. Chest X-ray ordered to verify placement.  CXR: pending.  Otilio Carpen Taydem Cavagnaro 02/09/2020, 1:21 AM

## 2020-02-09 NOTE — Progress Notes (Signed)
RT NOTE:  Pt transported to CT and back to ICU without event.  

## 2020-02-09 NOTE — Progress Notes (Signed)
Transported to CT with RT and transporter for STAT CT HEAD

## 2020-02-09 NOTE — Progress Notes (Signed)
Patient back from CT and daughter called and updated about the cardiac event last night with all questions answered at this time

## 2020-02-09 NOTE — Progress Notes (Signed)
Assisted tele visit to patient with daughter. ° °Tilda Samudio P, RN  °

## 2020-02-09 NOTE — Progress Notes (Signed)
Shift Note:  Patient was breathing over the ventilator and tachycardic with her heart rate and SBP>160.  Given prn labetalol slow 5 mg at a time for a total of 10 mg.  Patient possible had reaction when blood pressures went to 90/50's.  E-link informed immediately and I monitored and was trying to get a small NS bolus.  Patient had a heart rate throughout the rapid situation.  But continued to drop with Charge RN informed, E-link RN and MD on-line prior to bedside critical care team arriving.  ART line and CVC placed for better access and Na Phosphate and tube feeding restarted after patient was stabilized.  NO CPR compressions were performed.

## 2020-02-09 NOTE — Progress Notes (Signed)
Pharmacy Antibiotic Note  Beverly Jimenez is a 86 y.o. female admitted on 02/27/2020 with AMS and gaze.  Patient iss being treated for a UTI, now with possible ventriculitis so Pharmacy has been consulted for vancomycin and cefepime dosing.  SCr 0.7, CrCL 47 ml/min, hypothermic, WBC up to 24.1.  Plan: Vanc 1500 mg IV x 1, then 750mg  IV Q12H for vanc trough 15-20 mcg/mL Cefepime 2gm IV Q12H Monitor renal fxn, clinical progress, vanc trough prior to 4th dose   Height: 5\' 4"  (162.6 cm) Weight: 184 lb 4.9 oz (83.6 kg) IBW/kg (Calculated) : 54.7  Temp (24hrs), Avg:96.8 F (36 C), Min:94 F (34.4 C), Max:98.5 F (36.9 C)  Recent Labs  Lab 02/26/2020 0940 02/11/2020 1040 03/01/2020 1411 02/22/2020 2211 02/07/20 0755 02/07/20 0959 02/08/20 0402 02/08/20 0402 02/08/20 0755 02/08/20 1159 02/08/20 1646 02/08/20 2124 02/09/20 0016 02/09/20 0151  WBC 21.4*  --  11.2*  --  16.8*  --  20.9*  --   --   --   --   --   --  24.1*  CREATININE 0.55   < >  --    < > 0.48   < > 0.42*   < > 0.43* 0.43* 0.46 0.43* 0.70  --   LATICACIDVEN 1.7  --   --   --   --   --   --   --   --   --   --   --   --   --    < > = values in this interval not displayed.    Estimated Creatinine Clearance: 47 mL/min (by C-G formula based on SCr of 0.7 mg/dL).    Allergies  Allergen Reactions  . Amoxicillin Hives, Itching, Rash and Other (See Comments)    Has patient had a PCN reaction causing immediate rash, facial/tongue/throat swelling, SOB or lightheadedness with hypotension: Unknown Has patient had a PCN reaction causing SEVERE rash involving mucus membranes or skin necrosis:    #  #  YES  #  #  Has patient had a PCN reaction that required hospitalization: No Has patient had a PCN reaction occurring within the last 10 years: No    . Macrodantin [Nitrofurantoin Macrocrystal] Shortness Of Breath  . Penicillins Other (See Comments)    SEE AMOXICILLIN  . Sulfa Antibiotics Shortness Of Breath and Rash  . Cymbalta  [Duloxetine Hcl]     UNSPECIFIED REACTION     Vanc 3/4 >> 3/5, restart 3/7 >> Cefepime 3/7 >> Aztreonam 3/4 >> 3/5 LVQ 3/5>>3/6 CTX 3/6 >> 3/7  3/4 BCx - NGTD 3/4 UCx - 50K Kleb + 20K Providencia (both S to CTX, Cipro, Zosyn) 3/4 covid / flu / MRSA PCR - negative  Zohan Shiflet D. Mina Marble, PharmD, BCPS, [place] 02/09/2020, 8:53 AM

## 2020-02-09 NOTE — Procedures (Signed)
Arterial Catheter Insertion Procedure Note Beverly Jimenez [id number] Jul 29, 1925  Procedure: Insertion of Arterial Catheter  Indications: Blood pressure monitoring and Frequent blood sampling  Procedure Details Consent: Unable to obtain consent because of emergent medical necessity. Time Out: Verified patient identification, verified procedure, site/side was marked, verified correct patient position, special equipment/implants available, medications/allergies/relevent history reviewed, required imaging and test results available.  Performed  Maximum sterile technique was used including antiseptics, cap, gloves, gown, hand hygiene, mask and sheet. Skin prep: Chlorhexidine; local anesthetic administered 20 gauge catheter was inserted into right radial artery using the Seldinger technique. ULTRASOUND GUIDANCE USED: YES Evaluation Blood flow good; BP tracing good. Complications: No apparent complications.   Otilio Carpen Beverly Jimenez 02/09/2020

## 2020-02-09 NOTE — Progress Notes (Addendum)
Neurosurgery  Patient had continued neurologic deterioration overnight.  Dr. Lynetta Mare notified me of overnight events and CT head results.  This showed severe ventriculitis with significant purulent debris in the ventricles, enlarged ventricles, and herniation.  I examined the patient at the bedside.  Her pupils are 76mm and fixed in midposition bilaterally, and no gag is present, GCS 3.  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  I explained that continued most aggressive treatment would entail ventriculostomy procedure.  However, this would not change the ultimate outcome, and the daughter stated clearly that her mother would not want an invasive brain procedure in this situation with little chance of benefit.  They do want to continue with medical treatment including antibiotics for now.  Given her poor neurologic exam, even if she survives, the chance of meaningful functional outcome is nil, but we will give the patient's family time.  I examined her back.  Her dressing today has some saturation not surprisingly given her ICP increase but no active leaking seen.    BP (!) 128/39   Pulse 82   Temp (!) 96.3 F (35.7 C) (Axillary)   Resp 20   Ht 5\' 4"  (1.626 m)   Wt 83.6 kg   LMP  (LMP Unknown)   SpO2 100%   BMI 31.64 kg/m

## 2020-02-10 DIAGNOSIS — J9601 Acute respiratory failure with hypoxia: Secondary | ICD-10-CM

## 2020-02-10 DIAGNOSIS — G0490 Encephalitis and encephalomyelitis, unspecified: Secondary | ICD-10-CM

## 2020-02-10 DIAGNOSIS — G039 Meningitis, unspecified: Secondary | ICD-10-CM

## 2020-02-10 DIAGNOSIS — A419 Sepsis, unspecified organism: Principal | ICD-10-CM

## 2020-02-10 DIAGNOSIS — R6521 Severe sepsis with septic shock: Secondary | ICD-10-CM

## 2020-02-10 LAB — POCT I-STAT 7, (LYTES, BLD GAS, ICA,H+H)
Acid-base deficit: 7 mmol/L — ABNORMAL HIGH (ref 0.0–2.0)
Bicarbonate: 19.6 mmol/L — ABNORMAL LOW (ref 20.0–28.0)
Calcium, Ion: 1.39 mmol/L (ref 1.15–1.40)
HCT: 22 % — ABNORMAL LOW (ref 36.0–46.0)
Hemoglobin: 7.5 g/dL — ABNORMAL LOW (ref 12.0–15.0)
O2 Saturation: 82 %
Patient temperature: 98.2
Potassium: 2.2 mmol/L — CL (ref 3.5–5.1)
Sodium: 154 mmol/L — ABNORMAL HIGH (ref 135–145)
TCO2: 21 mmol/L — ABNORMAL LOW (ref 22–32)
pCO2 arterial: 45.3 mmHg (ref 32.0–48.0)
pH, Arterial: 7.242 — ABNORMAL LOW (ref 7.350–7.450)
pO2, Arterial: 53 mmHg — ABNORMAL LOW (ref 83.0–108.0)

## 2020-02-10 LAB — BPAM RBC
Blood Product Expiration Date: 202104062359
ISSUE DATE / TIME: 202103071951
Unit Type and Rh: 6200

## 2020-02-10 LAB — CBC
HCT: 26 % — ABNORMAL LOW (ref 36.0–46.0)
Hemoglobin: 8.6 g/dL — ABNORMAL LOW (ref 12.0–15.0)
MCH: 28.2 pg (ref 26.0–34.0)
MCHC: 33.1 g/dL (ref 30.0–36.0)
MCV: 85.2 fL (ref 80.0–100.0)
Platelets: 286 10*3/uL (ref 150–400)
RBC: 3.05 MIL/uL — ABNORMAL LOW (ref 3.87–5.11)
RDW: 15.8 % — ABNORMAL HIGH (ref 11.5–15.5)
WBC: 18.5 10*3/uL — ABNORMAL HIGH (ref 4.0–10.5)
nRBC: 0.2 % (ref 0.0–0.2)

## 2020-02-10 LAB — BASIC METABOLIC PANEL
Anion gap: 6 (ref 5–15)
BUN: 24 mg/dL — ABNORMAL HIGH (ref 8–23)
CO2: 22 mmol/L (ref 22–32)
Calcium: 8.7 mg/dL — ABNORMAL LOW (ref 8.9–10.3)
Chloride: 124 mmol/L — ABNORMAL HIGH (ref 98–111)
Creatinine, Ser: 0.81 mg/dL (ref 0.44–1.00)
GFR calc Af Amer: >60 mL/min (ref >60)
GFR calc non Af Amer: >60 mL/min (ref >60)
Glucose, Bld: 112 mg/dL — ABNORMAL HIGH (ref 70–99)
Potassium: 2.7 mmol/L — CL (ref 3.5–5.1)
Sodium: 152 mmol/L — ABNORMAL HIGH (ref 135–145)

## 2020-02-10 LAB — TYPE AND SCREEN
ABO/RH(D): A POS
Antibody Screen: NEGATIVE
Unit division: 0

## 2020-02-10 LAB — GLUCOSE, CAPILLARY
Glucose-Capillary: 109 mg/dL — ABNORMAL HIGH (ref 70–99)
Glucose-Capillary: 95 mg/dL (ref 70–99)
Glucose-Capillary: 108 mg/dL — ABNORMAL HIGH (ref 70–99)
Glucose-Capillary: 102 mg/dL — ABNORMAL HIGH (ref 70–99)
Glucose-Capillary: 106 mg/dL — ABNORMAL HIGH (ref 70–99)

## 2020-02-10 LAB — SODIUM
Sodium: 151 mmol/L — ABNORMAL HIGH (ref 135–145)
Sodium: 153 mmol/L — ABNORMAL HIGH (ref 135–145)
Sodium: 153 mmol/L — ABNORMAL HIGH (ref 135–145)

## 2020-02-10 LAB — PHOSPHORUS: Phosphorus: 2.7 mg/dL (ref 2.5–4.6)

## 2020-02-10 LAB — HEMOGLOBIN AND HEMATOCRIT, BLOOD
HCT: 24.8 % — ABNORMAL LOW (ref 36.0–46.0)
Hemoglobin: 8.3 g/dL — ABNORMAL LOW (ref 12.0–15.0)

## 2020-02-10 LAB — MAGNESIUM: Magnesium: 2.2 mg/dL (ref 1.7–2.4)

## 2020-02-10 LAB — OCCULT BLOOD X 1 CARD TO LAB, STOOL: Fecal Occult Bld: POSITIVE — AB

## 2020-02-10 MED ORDER — MORPHINE SULFATE (PF) 2 MG/ML IV SOLN: 2.0000 mg | INTRAVENOUS | Status: DC | PRN

## 2020-02-10 MED ORDER — SODIUM CHLORIDE 0.9 % IV SOLN: INTRAVENOUS | Status: DC

## 2020-02-10 MED ORDER — GLYCOPYRROLATE 1 MG PO TABS: 1.0000 mg | ORAL_TABLET | ORAL | Status: DC | PRN

## 2020-02-10 MED ORDER — ACETAMINOPHEN 650 MG RE SUPP: 650.0000 mg | Freq: Four times a day (QID) | RECTAL | Status: DC | PRN

## 2020-02-10 MED ORDER — MORPHINE 100MG IN NS 100ML (1MG/ML) PREMIX INFUSION
0.0000 mg/h | INTRAVENOUS | Status: DC
  Administered 2020-02-10: 19:00:00 5 mg/h via INTRAVENOUS
  Filled 2020-02-10: qty 100

## 2020-02-10 MED ORDER — SODIUM CHLORIDE 0.45 % IV SOLN: INTRAVENOUS | Status: DC

## 2020-02-10 MED ORDER — NOREPINEPHRINE 4 MG/250ML-% IV SOLN
0.0000 ug/min | INTRAVENOUS | Status: DC
  Administered 2020-02-10: 5.333 ug/min via INTRAVENOUS
  Administered 2020-02-10: 14:00:00 14 ug/min via INTRAVENOUS
  Administered 2020-02-10: 2 ug/min via INTRAVENOUS
  Filled 2020-02-10 (×3): qty 250

## 2020-02-10 MED ORDER — ACETAMINOPHEN 325 MG PO TABS: 650.0000 mg | ORAL_TABLET | Freq: Four times a day (QID) | ORAL | Status: DC | PRN

## 2020-02-10 MED ORDER — SODIUM CHLORIDE 0.9 % IV SOLN
2.0000 g | Freq: Two times a day (BID) | INTRAVENOUS | Status: DC
  Administered 2020-02-10: 2 g via INTRAVENOUS
  Filled 2020-02-10: qty 2

## 2020-02-10 MED ORDER — MORPHINE BOLUS VIA INFUSION
5.0000 mg | INTRAVENOUS | Status: DC | PRN
  Filled 2020-02-10: qty 5

## 2020-02-10 MED ORDER — DIPHENHYDRAMINE HCL 50 MG/ML IJ SOLN: 25.0000 mg | INTRAMUSCULAR | Status: DC | PRN

## 2020-02-10 MED ORDER — GLYCOPYRROLATE 0.2 MG/ML IJ SOLN: 0.2000 mg | INTRAMUSCULAR | Status: DC | PRN

## 2020-02-10 MED ORDER — POTASSIUM CHLORIDE 10 MEQ/50ML IV SOLN
10.0000 meq | INTRAVENOUS | Status: DC
  Administered 2020-02-10 (×2): 10 meq via INTRAVENOUS
  Filled 2020-02-10 (×4): qty 50

## 2020-02-10 MED ORDER — POLYVINYL ALCOHOL 1.4 % OP SOLN
1.0000 [drp] | Freq: Four times a day (QID) | OPHTHALMIC | Status: DC | PRN
  Filled 2020-02-10: qty 15

## 2020-02-10 MED ORDER — VASOPRESSIN 20 UNIT/ML IV SOLN
0.0300 [IU]/min | INTRAVENOUS | Status: DC
  Administered 2020-02-10: 07:00:00 0.03 [IU]/min via INTRAVENOUS
  Filled 2020-02-10: qty 2

## 2020-02-10 MED ORDER — POTASSIUM CHLORIDE 20 MEQ/15ML (10%) PO SOLN
40.0000 meq | ORAL | Status: AC
  Administered 2020-02-10 (×2): 40 meq via ORAL
  Filled 2020-02-10: qty 30

## 2020-02-10 MED FILL — Medication: Qty: 1 | Status: AC

## 2020-02-11 LAB — CULTURE, BLOOD (SINGLE): Culture: NO GROWTH

## 2020-03-05 NOTE — Progress Notes (Signed)
[facility] ADULT ICU REPLACEMENT PROTOCOL FOR AM LAB REPLACEMENT ONLY  The patient does apply for the [facility] Adult ICU Electrolyte Replacment Protocol based on the criteria listed below:   1. Is GFR >/= 40 ml/min? Yes.    Patient's GFR today is >60 2. Is urine output >/= 0.5 ml/kg/hr for the last 6 hours? Yes.   Patient's UOP is 2.1 ml/kg/hr 3. Is BUN < 60 mg/dL? Yes.    Patient's BUN today is 24 4. Abnormal electrolyte(s): k 2.8 5. Ordered repletion with: protocol by central line  6. If a panic level lab has been reported, has the CCM MD in charge been notified? No..   Physician:    Ronda Fairly A February 18, 2020 6:44 AM

## 2020-03-05 NOTE — Progress Notes (Addendum)
[facility] Progress Note Patient Name: Beverly Jimenez DOB: 1925-05-28 MRN: [id number]   Date of Service  02-13-2020  HPI/Events of Note  Na+ = 127 --> 134 --> 142 --> 146 --> 151 (over last 24 hours).  eICU Interventions  Will order: 1. D/C 3% NaCL IV infusion.  2. 0.9 NaCl to run IV at 50 mL/hour.  3. Continue to trend Na+.      Intervention Category Major Interventions: Electrolyte abnormality - evaluation and management  Jermiya Reichl [place] 02/13/20, 2:21 AM

## 2020-03-05 NOTE — Progress Notes (Signed)
Explained to family procedure of extubating patient and signs and symptoms of her passing.  Patient waited in family area while she was extubated by RT at 40.

## 2020-03-05 NOTE — Death Summary Note (Signed)
DEATH SUMMARY   Patient Details  Name: [place] Jimenez MRN: [id number] DOB: 1925-10-20  Admission/Discharge Information   Admit Date:  2020-02-07  Date of Death: Date of Death: 02-11-2020  Time of Death: Time of Death: 2009/02/26  Length of Stay: 4  Referring Physician: Marton Redwood, MD   Reason(s) for Hospitalization  Altered mental status  Diagnoses  Preliminary cause of death: Septic shock, meningoencephalitis Secondary Diagnoses (including complications and co-morbidities):  Principal Problem:   Acute metabolic encephalopathy Active Problems:   HTN (hypertension)   Acute hyponatremia   Asthma   Degenerative spondylolisthesis   Sepsis due to undetermined organism with acute respiratory failure ([place])   Bradycardia, sinus, persistent, severe   Encounter for central line placement   Brief Hospital Course (including significant findings, care, treatment, and services provided and events leading to death)  [place]ly Jimenez is a 86 y.o. year old female who had a recent (2/23 - 2/25) hospitalization for posterior lumbar fusion after which she was discharged to SNF for rehab who presented on 02/07/2020 with acute encephalopathy.  She initially did well post surgery and only had mild pain postoperatively for which she was taking Valium and oxycodone.  However she had a back incision that did have some drainage.  She was found to be hyponatremic with sepsis and concern for CNS infection.  She required mechanical ventilation due to clinical coma.  Her CT scan showed worsening of her baseline hydrocephalus.  On day 3 of admission she had neurologic deterioration and had a repeat CT scan which demonstrated ventriculitis and brain herniation.  Neurosurgery was following the patient and she was not a candidate for any kind of neurosurgical interventions.  The family elected to do a trial of antibiotic therapy for her severe meningoencephalitis.  Unfortunately the patient developed worsening septic shock  and worsening coma.  She was transition to comfort measures on February 11, 2023 expired with dignity at 02/26/2009.  Her son and daughter were both at the bedside.  Pertinent Labs and Studies  Significant Diagnostic Studies EEG  Result Date: February 07, 2020 Lora Havens, MD     February 07, 2020 12:38 PM Patient Name: [place] Jimenez MRN: [id number] Epilepsy Attending: Lora Havens Referring Physician/Provider: Etta Quill, PA Date: 07-Feb-2020 Duration: 24.59 minutes Patient history: 86 year old female presented with altered mental status.  On exam was noted to have right gaze preference.  EEG to evaluate for seizures. Level of alertness: Lethargic AEDs during EEG study: None Technical aspects: This EEG study was done with scalp electrodes positioned according to the 10-20 International system of electrode placement. Electrical activity was acquired at a sampling rate of 500Hz  and reviewed with a high frequency filter of 70Hz  and a low frequency filter of 1Hz . EEG data were recorded continuously and digitally stored. Description: No clear posterior dominant rhythm was seen.  EEG showed continuous generalized 3 to 6 Hz theta-delta slowing.  Hyperventilation and photic stimulation were not performed due to altered mental status. Abnormality - Continuous slow, generalized IMPRESSION: This study is suggestive of moderate diffuse encephalopathy, nonspecific to etiology. No seizures or epileptiform discharges were seen throughout the recording. Dr. Lorraine Lax was notified. Lora Havens   DG Lumbar Spine 2-3 Views  Result Date: 01/28/2020 CLINICAL DATA:  Lumbar surgery. EXAM: LUMBAR SPINE - 2-3 VIEW; DG C-ARM 1-60 MIN COMPARISON:  MRI 12/13/2018. FINDINGS: Lower lumbar posterior and interbody fusion. Visualized hardware intact. Anatomic alignment. Fluoroscopy time 0 minutes 20 seconds. IMPRESSION: Postsurgical changes lumbar spine. Electronically Signed   By: Marcello Moores  Register   On: 01/28/2020 11:25   CT HEAD WO CONTRAST  Result Date:  02/09/2020 CLINICAL DATA:  Acute change in neuro exam EXAM: CT HEAD WITHOUT CONTRAST TECHNIQUE: Contiguous axial images were obtained from the base of the skull through the vertex without intravenous contrast. COMPARISON:  02/08/2020 FINDINGS: Brain: There is worsening hydrocephalus with increased enlargement of the lateral ventricles. There is new layering density within the lateral ventricles, which is primarily isodense with some relative hyperdensity. There may be some density within the right posterior recess of the fourth ventricle as well. Hypoattenuation in the periventricular white matter is nonspecific but may reflect transependymal flow CSF. There is new diffuse sulcal effacement. Basal cistern effacement is also noted. Gray-white differentiation is present. Vascular: No hyperdense vessel.There is atherosclerotic calcification at the skull base. Skull: Calvarium is unremarkable. Sinuses/Orbits: No acute finding. Other: None. IMPRESSION: Worsening hydrocephalus with diffuse cerebral edema resulting in sulcal and cisternal effacement. There is new layering density within the lateral ventricles, which may reflect ventriculitis or possibly blood products in the setting of coagulopathy. These results were called by telephone at the time of interpretation on 02/09/2020 at 7:29 am to provider Dr. Lynetta Mare, who verbally acknowledged these results. Electronically Signed   By: Macy Mis M.D.   On: 02/09/2020 07:34   DG Chest Port 1 View  Result Date: 02/09/2020 CLINICAL DATA:  Central line placement EXAM: PORTABLE CHEST 1 VIEW COMPARISON:  February 07, 2020 FINDINGS: The endotracheal tube terminates above the carina by approximately 2.4 cm. There is a left-sided central venous catheter with tip projecting over the cavoatrial junction. There is no left-sided pneumothorax. There is an enteric tube extends below the left hemidiaphragm with the tip terminating over the gastric fundus. The heart size is stable.  Bilateral pleural effusions are noted with hazy bilateral airspace opacities and prominent interstitial lung markings suggestive of worsening pulmonary edema. IMPRESSION: 1. Lines and tubes as above.  No pneumothorax. 2. Worsening bilateral pleural effusions and bilateral airspace opacities. Findings are favored to represent pulmonary edema with an underlying atypical infectious process not excluded. Electronically Signed   By: Constance Holster M.D.   On: 02/09/2020 01:39   Portable Chest xray  Result Date: 02/07/2020 CLINICAL DATA:  Intubation. Stroke EXAM: PORTABLE CHEST 1 VIEW COMPARISON:  One-view chest x-ray 02/07/2020 FINDINGS: The heart size is normal. Endotracheal tube terminates 2.1 cm above the carina, in satisfactory position. Atherosclerotic changes are present at the aortic arch. Mild pulmonary vascular congestion is present. Increasing left basilar opacity is noted. IMPRESSION: 1. Satisfactory positioning of endotracheal tube. 2. Increasing left basilar airspace disease. While this likely reflects atelectasis, infection or aspiration is not excluded. Electronically Signed   By: San Morelle M.D.   On: 02/07/2020 07:04   DG Chest Portable 1 View  Result Date: 02/09/2020 CLINICAL DATA:  Endotracheal placement.  Stroke presentation. EXAM: PORTABLE CHEST 1 VIEW COMPARISON:  Earlier same day FINDINGS: Endotracheal tube tip 2 cm above the carina. Orogastric or nasogastric tube enters the stomach with its tip in the fundus. Heart size is normal. Aortic atherosclerosis. Patchy bilateral pulmonary densities could represent developing pneumonia, possibly aspiration. No dense consolidation or collapse. IMPRESSION: Endotracheal tube and gastric tube well positioned. Developing patchy bilateral pulmonary infiltrates could be pneumonia, possibly aspiration. Electronically Signed   By: Nelson Chimes M.D.   On: 02/11/2020 16:03   DG Chest Port 1 View  Result Date: 02/23/2020 CLINICAL DATA:  Altered  mental status EXAM: PORTABLE CHEST 1 VIEW COMPARISON:  01/16/2017 FINDINGS: Heart is upper limits normal in size. Bibasilar atelectasis. No effusions or acute bony abnormality. IMPRESSION: Bibasilar atelectasis. Electronically Signed   By: Rolm Baptise M.D.   On: 02/03/2020 10:02   DG C-Arm 1-60 Min  Result Date: 01/28/2020 CLINICAL DATA:  Lumbar surgery. EXAM: LUMBAR SPINE - 2-3 VIEW; DG C-ARM 1-60 MIN COMPARISON:  MRI 12/13/2018. FINDINGS: Lower lumbar posterior and interbody fusion. Visualized hardware intact. Anatomic alignment. Fluoroscopy time 0 minutes 20 seconds. IMPRESSION: Postsurgical changes lumbar spine. Electronically Signed   By: Marcello Moores  Register   On: 01/28/2020 11:25   CT HEAD CODE STROKE WO CONTRAST  Result Date: 02/18/2020 CLINICAL DATA:  Code stroke. Neuro deficit acute. Rule out stroke. Altered mental status. EXAM: CT HEAD WITHOUT CONTRAST TECHNIQUE: Contiguous axial images were obtained from the base of the skull through the vertex without intravenous contrast. COMPARISON:  MRI head 03/30/2005 FINDINGS: Brain: Progressive enlargement of the third and lateral ventricles with rounded frontal horns and rounded third ventricle suggesting obstruction. Mild dilatation of the proximal aqueduct. Fourth ventricle normal in size. Findings suggest obstructive hydrocephalus. Mild atrophy. Chronic microvascular ischemic change in the white matter. No acute infarct, hemorrhage, mass. Vascular: Negative for hyperdense vessel Skull: Negative Sinuses/Orbits: Paranasal sinuses clear. Prior sinus surgery involving the left maxillary sinus. Bilateral cataract extraction. Other: None ASPECTS ([place] Stroke Program Early CT Score) - Ganglionic level infarction (caudate, lentiform nuclei, internal capsule, insula, M1-M3 cortex): 7 - Supraganglionic infarction (M4-M6 cortex): 3 Total score (0-10 with 10 being normal): 10 IMPRESSION: 1. No intracranial hemorrhage or acute infarct 2. Mild to moderate  hydrocephalus has developed since 2006. No interval studies to determine chronicity. 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 3. ASPECTS is 10 4. These results were called by telephone at the time of interpretation on 02/27/2020 at 10:28 am to provider A[place]or, who verbally acknowledged these results. Electronically Signed   By: Franchot Gallo M.D.   On: 02/29/2020 10:29   VAS [place] LOWER EXTREMITY VENOUS (DVT)  Result Date: 02/07/2020  Lower Venous DVTStudy Indications: Edema.  Limitations: Body habitus, poor ultrasound/tissue interface and intubated. Comparison Study: No prior study Performing Technologist: Maudry Mayhew MHA, RDMS, RVT, RDCS  Examination Guidelines: A complete evaluation includes B-mode imaging, spectral Doppler, color Doppler, and power Doppler as needed of all accessible portions of each vessel. Bilateral testing is considered an integral part of a complete examination. Limited examinations for reoccurring indications may be performed as noted. The reflux portion of the exam is performed with the patient in reverse Trendelenburg.  +---------+---------------+---------+-----------+----------+--------------+ RIGHT    CompressibilityPhasicitySpontaneityPropertiesThrombus Aging +---------+---------------+---------+-----------+----------+--------------+ CFV      Full           Yes      Yes                                 +---------+---------------+---------+-----------+----------+--------------+ SFJ      Full                                                        +---------+---------------+---------+-----------+----------+--------------+ FV Prox  Full                                                        +---------+---------------+---------+-----------+----------+--------------+  FV Mid   Full                                                        +---------+---------------+---------+-----------+----------+--------------+ FV DistalFull                                                         +---------+---------------+---------+-----------+----------+--------------+ PFV      Full                                                        +---------+---------------+---------+-----------+----------+--------------+ POP      Full           Yes      Yes                                 +---------+---------------+---------+-----------+----------+--------------+ PTV      Full                                                        +---------+---------------+---------+-----------+----------+--------------+ PERO     Full                                                        +---------+---------------+---------+-----------+----------+--------------+   +---------+---------------+---------+-----------+----------+--------------+ LEFT     CompressibilityPhasicitySpontaneityPropertiesThrombus Aging +---------+---------------+---------+-----------+----------+--------------+ CFV      Full           Yes      Yes                                 +---------+---------------+---------+-----------+----------+--------------+ SFJ      Full                                                        +---------+---------------+---------+-----------+----------+--------------+ FV Prox  Full                                                        +---------+---------------+---------+-----------+----------+--------------+ FV Mid   Full                                                        +---------+---------------+---------+-----------+----------+--------------+  FV DistalFull                                                        +---------+---------------+---------+-----------+----------+--------------+ PFV      Full                                                        +---------+---------------+---------+-----------+----------+--------------+ POP      Full           Yes      Yes                                  +---------+---------------+---------+-----------+----------+--------------+ PTV      Full                                                        +---------+---------------+---------+-----------+----------+--------------+ PERO     Full                                                        +---------+---------------+---------+-----------+----------+--------------+     Summary: RIGHT: - There is no evidence of deep vein thrombosis in the lower extremity.  - No cystic structure found in the popliteal fossa.  LEFT: - There is no evidence of deep vein thrombosis in the lower extremity.  - No cystic structure found in the popliteal fossa.  *See table(s) above for measurements and observations. Electronically signed by Monica Martinez MD on 02/07/2020 at 4:38:39 PM.    Final     Microbiology Recent Results (from the past 240 hour(s))  Culture, blood (single) w Reflex to ID Panel     Status: None (Preliminary result)   Collection Time: 02/11/2020 10:15 AM   Specimen: BLOOD  Result Value Ref Range Status   Specimen Description BLOOD LEFT ANTECUBITAL  Final   Special Requests   Final    BOTTLES DRAWN AEROBIC AND ANAEROBIC Blood Culture results may not be optimal due to an inadequate volume of blood received in culture bottles   Culture   Final    NO GROWTH 4 DAYS Performed at [facility], [place] [address] [place], [place] [address]    Report Status PENDING  Incomplete  Culture, Urine     Status: Abnormal   Collection Time: 03/03/2020 10:32 AM   Specimen: Urine, Random  Result Value Ref Range Status   Specimen Description URINE, RANDOM  Final   Special Requests   Final    NONE Performed at [facility], [place] [address] [place], [place] [address]    Culture (A)  Final    50,000 COLONIES/mL KLEBSIELLA PNEUMONIAE 20,000 COLONIES/mL PROVIDENCIA RETTGERI    Report Status 02/08/2020 FINAL  Final   Organism ID, Bacteria KLEBSIELLA PNEUMONIAE (A)  Final  Organism ID, Bacteria  PROVIDENCIA RETTGERI (A)  Final      Susceptibility   Klebsiella pneumoniae - MIC*    AMPICILLIN RESISTANT Resistant     CEFAZOLIN <=4 SENSITIVE Sensitive     CEFTRIAXONE <=0.25 SENSITIVE Sensitive     CIPROFLOXACIN <=0.25 SENSITIVE Sensitive     GENTAMICIN <=1 SENSITIVE Sensitive     IMIPENEM <=0.25 SENSITIVE Sensitive     NITROFURANTOIN 64 INTERMEDIATE Intermediate     TRIMETH/SULFA <=20 SENSITIVE Sensitive     AMPICILLIN/SULBACTAM 4 SENSITIVE Sensitive     PIP/TAZO <=4 SENSITIVE Sensitive     * 50,000 COLONIES/mL KLEBSIELLA PNEUMONIAE   Providencia rettgeri - MIC*    AMPICILLIN >=32 RESISTANT Resistant     CEFAZOLIN >=64 RESISTANT Resistant     CEFTRIAXONE <=0.25 SENSITIVE Sensitive     CIPROFLOXACIN <=0.25 SENSITIVE Sensitive     GENTAMICIN <=1 SENSITIVE Sensitive     IMIPENEM 1 SENSITIVE Sensitive     NITROFURANTOIN RESISTANT Resistant     TRIMETH/SULFA <=20 SENSITIVE Sensitive     AMPICILLIN/SULBACTAM >=32 RESISTANT Resistant     PIP/TAZO <=4 SENSITIVE Sensitive     * 20,000 COLONIES/mL PROVIDENCIA RETTGERI  SARS CORONAVIRUS 2 (TAT 6-24 HRS) Nasopharyngeal Nasopharyngeal Swab     Status: None   Collection Time: 02/05/2020 12:08 PM   Specimen: Nasopharyngeal Swab  Result Value Ref Range Status   SARS Coronavirus 2 NEGATIVE NEGATIVE Final    Comment: (NOTE) SARS-CoV-2 target nucleic acids are NOT DETECTED. The SARS-CoV-2 RNA is generally detectable in upper and lower respiratory specimens during the acute phase of infection. Negative results do not preclude SARS-CoV-2 infection, do not rule out co-infections with other pathogens, and should not be used as the sole basis for treatment or other patient management decisions. Negative results must be combined with clinical observations, patient history, and epidemiological information. The expected result is Negative. Fact Sheet for Patients: SugarRoll.be Fact Sheet for Healthcare  Providers: https://www.woods-mathews.com/ This test is not yet approved or cleared by the [place] FDA and  has been authorized for detection and/or diagnosis of SARS-CoV-2 by FDA under an Emergency Use Authorization (EUA). This EUA will remain  in effect (meaning this test can be used) for the duration of the COVID-19 declaration under Section 56 4(b)(1) of the Act, 21 U.S.C. section 360bbb-3(b)(1), unless the authorization is terminated or revoked sooner. Performed at [facility], [place] [address], [place], [place] [address]   Respiratory Panel by RT PCR (Flu A&B, Covid) - Nasopharyngeal Swab     Status: None   Collection Time: 02/26/2020  2:39 PM   Specimen: Nasopharyngeal Swab  Result Value Ref Range Status   SARS Coronavirus 2 by RT PCR NEGATIVE NEGATIVE Final    Comment: (NOTE) SARS-CoV-2 target nucleic acids are NOT DETECTED. The SARS-CoV-2 RNA is generally detectable in upper respiratoy specimens during the acute phase of infection. The lowest concentration of SARS-CoV-2 viral copies this assay can detect is 131 copies/mL. A negative result does not preclude SARS-Cov-2 infection and should not be used as the sole basis for treatment or other patient management decisions. A negative result may occur with  improper specimen collection/handling, submission of specimen other than nasopharyngeal swab, presence of viral mutation(s) within the areas targeted by this assay, and inadequate number of viral copies (<131 copies/mL). A negative result must be combined with clinical observations, patient history, and epidemiological information. The expected result is Negative. Fact Sheet for Patients:  PinkCheek.be Fact Sheet for Healthcare Providers:  GravelBags.it This test is not yet ap proved or cleared by the [place] and  has been authorized for detection and/or diagnosis of SARS-CoV-2 by FDA  under an Emergency Use Authorization (EUA). This EUA will remain  in effect (meaning this test can be used) for the duration of the COVID-19 declaration under Section 564(b)(1) of the Act, 21 U.S.C. section 360bbb-3(b)(1), unless the authorization is terminated or revoked sooner.    Influenza A by PCR NEGATIVE NEGATIVE Final   Influenza B by PCR NEGATIVE NEGATIVE Final    Comment: (NOTE) The Xpert Xpress SARS-CoV-2/FLU/RSV assay is intended as an aid in  the diagnosis of influenza from Nasopharyngeal swab specimens and  should not be used as a sole basis for treatment. Nasal washings and  aspirates are unacceptable for Xpert Xpress SARS-CoV-2/FLU/RSV  testing. Fact Sheet for Patients: PinkCheek.be Fact Sheet for Healthcare Providers: GravelBags.it This test is not yet approved or cleared by the [place] FDA and  has been authorized for detection and/or diagnosis of SARS-CoV-2 by  FDA under an Emergency Use Authorization (EUA). This EUA will remain  in effect (meaning this test can be used) for the duration of the  Covid-19 declaration under Section 564(b)(1) of the Act, 21  U.S.C. section 360bbb-3(b)(1), unless the authorization is  terminated or revoked. Performed at [facility], [place] [address] [place], [place] [address]   MRSA PCR Screening     Status: None   Collection Time: 02/27/2020  5:49 PM  Result Value Ref Range Status   MRSA by PCR NEGATIVE NEGATIVE Final    Comment:        The GeneXpert MRSA Assay (FDA approved for NASAL specimens only), is one component of a comprehensive MRSA colonization surveillance program. It is not intended to diagnose MRSA infection nor to guide or monitor treatment for MRSA infections. Performed at [facility], [place] [address], [place], [place] [address]     Lab Basic Metabolic Panel: Recent Labs  Lab 02/07/20 2035 02/08/20 0012 02/08/20 0402  02/08/20 0755 02/08/20 1159 02/08/20 1159 02/08/20 1646 02/08/20 1646 02/08/20 2124 02/08/20 2124 02/09/20 0016 02/09/20 0800 02/09/20 1428 02/09/20 1959 February 16, 2020 0121 2020-02-16 0519 February 16, 2020 0636 16-Feb-2020 0739 Feb 16, 2020 1322  NA 122*   < > 125*   < > 125*   < > 128*   < > 128*   < > 127*   < > 142   < > 151* 152* 154* 153* 153*  K 3.1*   < > 3.7   < > 3.8   < > 3.7  --  3.6  --  3.6  --   --   --   --  2.7* 2.2*  --   --   CL 94*   < > 93*   < > 95*  --  96*  --  95*  --  98  --   --   --   --  124*  --   --   --   CO2 22   < > 22   < > 23  --  23  --  23  --  20*  --   --   --   --  22  --   --   --   GLUCOSE 123*   < > 132*   < > 137*  --  115*  --  133*  --  124*  --   --   --   --  112*  --   --   --   BUN 9   < > 9   < > 10  --  12  --  12  --  16  --   --   --   --  24*  --   --   --   CREATININE 0.41*   < > 0.42*   < > 0.43*  --  0.46  --  0.43*  --  0.70  --   --   --   --  0.81  --   --   --   CALCIUM 8.1*   < > 8.5*   < > 8.5*  --  8.6*  --  8.8*  --  8.2*  --   --   --   --  8.7*  --   --   --   MG 1.7  --  2.0  --   --   --  2.1  --   --   --   --   --  2.4  --   --  2.2  --   --   --   PHOS 1.8*   < > 1.3*  --   --   --  <1.0*  --   --   --  2.5  --  2.2*  --   --  2.7  --   --   --    < > = values in this interval not displayed.   Liver Function Tests: Recent Labs  Lab 02/12/2020 0940 02/09/20 0016  AST 27 18  ALT 41 16  ALKPHOS 63 53  BILITOT 1.5* 0.4  PROT 7.0 4.4*  ALBUMIN 3.2* 1.6*   Recent Labs  Lab 02/09/2020 0940  LIPASE 22   No results for input(s): AMMONIA in the last 168 hours. CBC: Recent Labs  Lab 02/29/2020 1009 02/09/2020 1019 02/07/20 0755 02/07/20 0755 02/08/20 0402 02/08/20 0402 02/09/20 0151 02/09/20 1804 02/23/20 0121 23-Feb-2020 0519 February 23, 2020 0636  WBC  --    < > 16.8*  --  20.9*  --  24.1* 19.6*  --  18.5*  --   NEUTROABS 19.8*  --   --   --   --   --  22.0*  --   --   --   --   HGB  --    < > 8.9*   < > 8.1*   < > 7.1* 6.5* 8.3*  8.6* 7.5*  HCT  --    < > 26.4*   < > 23.2*   < > 20.5* 20.0* 24.8* 26.0* 22.0*  MCV  --    < > 80.0  --  77.9*  --  79.2* 83.3  --  85.2  --   PLT  --    < > 390  --  377  --  409* 336  --  286  --    < > = values in this interval not displayed.   Cardiac Enzymes: No results for input(s): CKTOTAL, CKMB, CKMBINDEX, TROPONINI in the last 168 hours. Sepsis Labs: Recent Labs  Lab 02/04/2020 0940 02/03/2020 1411 02/08/20 0402 02/09/20 0151 02/09/20 1804 23-Feb-2020 0519  WBC 21.4*   < > 20.9* 24.1* 19.6* 18.5*  LATICACIDVEN 1.7  --   --   --   --   --    < > = values in this interval not displayed.    Spero Geralds 02/11/2020, 12:46 PM

## 2020-03-05 NOTE — Progress Notes (Signed)
Informed Charge Rn that [place] donor was called patient not applicable to donate due to age.

## 2020-03-05 NOTE — Progress Notes (Signed)
100cc Morphine gtt wasted and verified by Weldon Inches, RN.

## 2020-03-05 NOTE — Progress Notes (Signed)
E-Link notified patient unable to sustain SBP >100 on ART Line.  Epinephrine drip ayt maximum dosage over 1/2 the shift which they were made aware.  And over 1 liter urinary output as well.Beverly Jimenez

## 2020-03-05 NOTE — Progress Notes (Signed)
Patient passed at 2010 pronounced with [facility].  Family was [present at bedside and given patient placement cards since they don't know the funeral home at this time.  Dentures and hat to be sent with the body.

## 2020-03-05 NOTE — Progress Notes (Signed)
Notified by nursing that patient's family is at the bedside and ready to transition to comfort measures. Orders placed. Would transition to morphine gtt as needed titrated for comfort and air hunger. Consider palliative extubation first followed by de-scalation of vasoactive agents.

## 2020-03-05 NOTE — Progress Notes (Signed)
Updated daughter about deteriorating status of mom.  Explained the interventions taken last night for her mom's care.  Informed that MD will probably update her brother or her.

## 2020-03-05 NOTE — Progress Notes (Signed)
Providing Compassionate, Quality Care - Together   Subjective: Patient is intubated and unresponsive. Her son is at the bedside.  Objective: Vital signs in last 24 hours: Temp:  [96.7 F (35.9 C)-98.9 F (37.2 C)] 98.9 F (37.2 C) (03/08 1115) Pulse Rate:  [72-118] 103 (03/08 1215) Resp:  [17-27] 20 (03/08 1215) BP: (71-143)/(30-51) 137/51 (03/08 1200) SpO2:  [90 %-100 %] 100 % (03/08 1215) Arterial Line BP: (58-134)/(18-65) 120/39 (03/08 1215) FiO2 (%):  [40 %] 40 % (03/08 1200) Weight:  [84.2 kg] 84.2 kg (03/08 0500)  Intake/Output from previous day: 03/07 0701 - 03/08 0700 In: 4448.7 [I.V.:2258.9; Blood:439.6; NG/GT:1100; IV Piggyback:650.2] Out: [id number] [Urine:3185] Intake/Output this shift: Total I/O In: 1100.2 [I.V.:760.8; IV Piggyback:339.4] Out: [id number] [Urine:1650]  Patient is unresponsive No movement to painful stimuli Left pupil 3 round, non-reactive Right pupil 4 slightly irregular, non-reactive Intubated Incision with foam dressing which is clean, dry, and intact  Lab Results: Recent Labs    02/09/20 1804 February 24, 2020 0121 02/24/2020 0519 02-24-2020 0636  WBC 19.6*  --  18.5*  --   HGB 6.5*   < > 8.6* 7.5*  HCT 20.0*   < > 26.0* 22.0*  PLT 336  --  286  --    < > = values in this interval not displayed.   BMET Recent Labs    02/09/20 0016 02/09/20 0800 24-Feb-2020 0519 02-24-20 0519 02-24-2020 0636 02/24/20 0739  NA 127*   < > 152*   < > 154* 153*  K 3.6  --  2.7*  --  2.2*  --   CL 98  --  124*  --   --   --   CO2 20*  --  22  --   --   --   GLUCOSE 124*  --  112*  --   --   --   BUN 16  --  24*  --   --   --   CREATININE 0.70  --  0.81  --   --   --   CALCIUM 8.2*  --  8.7*  --   --   --    < > = values in this interval not displayed.    Studies/Results: CT HEAD WO CONTRAST  Result Date: 02/09/2020 CLINICAL DATA:  Acute change in neuro exam EXAM: CT HEAD WITHOUT CONTRAST TECHNIQUE: Contiguous axial images were obtained from the base of the skull  through the vertex without intravenous contrast. COMPARISON:  02/25/2020 FINDINGS: Brain: There is worsening hydrocephalus with increased enlargement of the lateral ventricles. There is new layering density within the lateral ventricles, which is primarily isodense with some relative hyperdensity. There may be some density within the right posterior recess of the fourth ventricle as well. Hypoattenuation in the periventricular white matter is nonspecific but may reflect transependymal flow CSF. There is new diffuse sulcal effacement. Basal cistern effacement is also noted. Gray-white differentiation is present. Vascular: No hyperdense vessel.There is atherosclerotic calcification at the skull base. Skull: Calvarium is unremarkable. Sinuses/Orbits: No acute finding. Other: None. IMPRESSION: Worsening hydrocephalus with diffuse cerebral edema resulting in sulcal and cisternal effacement. There is new layering density within the lateral ventricles, which may reflect ventriculitis or possibly blood products in the setting of coagulopathy. These results were called by telephone at the time of interpretation on 02/09/2020 at 7:29 am to provider Dr. Lynetta Mare, who verbally acknowledged these results. Electronically Signed   By: Macy Mis M.D.   On: 02/09/2020 07:34   DG  Chest Port 1 View  Result Date: 02/09/2020 CLINICAL DATA:  Central line placement EXAM: PORTABLE CHEST 1 VIEW COMPARISON:  February 07, 2020 FINDINGS: The endotracheal tube terminates above the carina by approximately 2.4 cm. There is a left-sided central venous catheter with tip projecting over the cavoatrial junction. There is no left-sided pneumothorax. There is an enteric tube extends below the left hemidiaphragm with the tip terminating over the gastric fundus. The heart size is stable. Bilateral pleural effusions are noted with hazy bilateral airspace opacities and prominent interstitial lung markings suggestive of worsening pulmonary edema.  IMPRESSION: 1. Lines and tubes as above.  No pneumothorax. 2. Worsening bilateral pleural effusions and bilateral airspace opacities. Findings are favored to represent pulmonary edema with an underlying atypical infectious process not excluded. Electronically Signed   By: Constance Holster M.D.   On: 02/09/2020 01:39    Assessment/Plan: Beverly Jimenez is approximately 2 weeks status post L2-3 posterior fusion by Dr. Annette Stable. She  developed a metabolic encephalopathy since her discharge on 01/30/2020. She was admitted to the ICU due to her critically low sodium and concern for airway protection. She was intubated in the emergency department. Her wound was reinforced in the emergency department. 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 She was already being treated with ceftriaxone for aspiration pneumonia. She is now on Vancomycin and cefepime. Family wishes to continue antibiotic therapy, but has declined further neurosurgical intervention.   LOS: 4 days     Viona Gilmore, DNP, AGNP-C Nurse Practitioner  [facility] [place] [address], [address], [place],  [address] P: [phone]    F: [phone]  02/17/2020, 12:33 PM

## 2020-03-05 NOTE — Progress Notes (Signed)
[name] DOB: [birth year] MRN: [id number]   Date of Service  Feb 17, 2020  HPI/Events of Note  Hypotension - BP = 82/35. Currently on an Epinephrine IV infusion.   eICU Interventions  Will order: 1. Vasopressin IV infusion at shock dose. 2. Norepinephrine IV infusion. Titrate to MAP >= 65. 3. ABG STAT.     Intervention Category Major Interventions: Hypotension - evaluation and management  Beverly Jimenez 17-Feb-2020, 6:28 AM

## 2020-03-05 NOTE — Progress Notes (Signed)
Assisted tele visit to patient with family member.  Desaree Downen P, RN  

## 2020-03-05 NOTE — Progress Notes (Signed)
Assisted tele visit to patient with family member.  Beverly Augusta P, RN  

## 2020-03-05 NOTE — Progress Notes (Signed)
Patient terminally extubated to room air at [place] without any complications. RN at bedside.

## 2020-03-05 NOTE — Progress Notes (Signed)
.   NAMEBLONDIE CLUNE, MRN:  [id number], DOB:  06-Oct-1925, LOS: 4 ADMISSION DATE:  02/29/2020, CONSULTATION DATE:  02/05/2020 REFERRING MD:  Dr. Sedonia Small, CHIEF COMPLAINT:  AMS with respiratory distress    Brief History   86yo female presented from local SNF with AMS, found to be significantly hyponatremic with a sodium of 116. PCCM consulted for admission.   History of present illness   Maran Sato Is a 86yo female with recent (2/23 - 2/25) hospitalization for posterior lumbar fusion after which she was discharged to SNF for rehab who presented with complaint of AMS. PMX includes lumbar stenosis, GERD, Depression, anemia, anxiety, arthritis, HTN, and HLD. History obtained from son as patient is currently encephalopathic. Per son patient originally did well post surgery with only mild pain post surgery for which she was was prescribed Valium and Oxycodone. Son reports over the last few days his mom was  recently placed on 2L [place] and that her back incision was recently noted to be draining.   On arrival vital signs appeared stable but over the course of her ED stay she became tachypnic, hypertensive, and tachycardiac with worsening mentation. Due to right gaze preference code stroke called but Head CT negative. Labwork significant for severe hyponatremia with sodium of 116, CL 78, WBC 21.4, hgb 10.3, and lactic within normal of 1.7. PCCM consulted for further management and admission.   Past Medical History  GERD Depression Anemia Anxiety Arthritis HTN  HLD  Significant Hospital Events   Admitted and intubated 3/4  Consults:  Neurosurgery   Procedures:    Significant Diagnostic Tests:  CXR 3/4 > Bibasilar atelectasis   Head CT 3/4 > 1. No intracranial hemorrhage or acute infarct 2. Mild to moderate hydrocephalus has developed since 2006. No interval studies to determine chronicity. 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  Micro Data:  COVID 3/4 > Blood  culture 3/4 > Urine 3/4 >  Antimicrobials:  Vanc 3/4>3/5 Aztreonam 3/4>3/5   Interim history/subjective:  Overnight declined dramatically now in three vasoactive agent shock. Not responsive  Objective   Blood pressure (!) 130/55, pulse (!) 106, temperature 98.9 F (37.2 C), temperature source Axillary, resp. rate 20, height 5\' 4"  (1.626 m), weight 84.2 kg, SpO2 100 %.    Vent Mode: PRVC FiO2 (%):  [40 %] 40 % Set Rate:  [20 bmp] 20 bmp Vt Set:  [430 mL] 430 mL PEEP:  [5 cmH20] 5 cmH20 Plateau Pressure:  [19 cmH20-23 cmH20] 23 cmH20   Intake/Output Summary (Last 24 hours) at March 09, 2020 1522 Last data filed at 03/09/2020 1345 Gross per 24 hour  Intake 4464.86 ml  Output 3585 ml  Net 879.86 ml   Filed Weights   02/08/20 0500 02/09/20 0336 Mar 09, 2020 0500  Weight: 82.9 kg 83.6 kg 84.2 kg   Physical Exam: General: Chronically ill-appearing and elderly female, off all sedation HENT: [place], AT, ETT in place Eyes: Pupils midposition, no doll's eyes, no reaction to light., no scleral icterus Respiratory: Coarse breath sounds bilaterally. Cardiovascular: RRR, -M/R/G, no JVD GI: BS+, soft, nontender Extremities: + Edema of hands,-tenderness Skin: Lower back sutures with fluctuance or erythema Neuro: No response to painful stimuli.  Resolved Hospital Problem list     Assessment & Plan:   Critically ill due to acute respiratory failure secondary to presumed aspiration pneumonitis/pneumonia requiring mechanical ventilation. History of asthma. Tolerating SBT but mental status precludes extubation -Continue full support.  Acute hypovolemic hyponatremia -Correct slowly, appears euvolemic at  present  Critically ill due to acute hydrocephalus likely secondary to ventriculitis with evidence of pus in the cerebral ventricular system.  Progressive cerebral edema. Seen by neurosurgery.  Prognosis is poor.  No plans for surgical intervention per neurosurgery and discussion with family. -she  has declined dramatically in the last 24 hours despite antibiotic therapy.   Critically ill due to shock requiring titration of epinephrine infusion. Likely secondary to worsening cerebral edema and herniation. -Titrate to keep systolic blood pressure greater than 110.  Hypokalemia P:  Repleted  Recent lumbar fusion  P Management per neurosurgery  Supportive care    Best practice:  Diet: tolerating tube feeds Pain/Anxiety/Delirium protocol (if indicated): prn only  VAP protocol (if indicated): bundle in place DVT prophylaxis: Subq heparin  GI prophylaxis: PPI Glucose control: Monitor  Mobility: Bedrest  Code Status: DNR, no escalation of care Family Communication: Talked with son at bedside and daughter over the phone. Given her lack of response to antibiotic therapy and ongoing decline in three vasopressor shock. They have elected to transition to comfort measures when they can both be here.  Disposition: ICU   Labs   CBC: Recent Labs  Lab 02/04/2020 1009 02/20/2020 1019 02/07/20 0755 02/07/20 0755 02/08/20 0402 02/08/20 0402 02/09/20 0151 02/09/20 1804 Feb 29, 2020 0121 02-29-20 0519 02-29-20 0636  WBC  --    < > 16.8*  --  20.9*  --  24.1* 19.6*  --  18.5*  --   NEUTROABS 19.8*  --   --   --   --   --  22.0*  --   --   --   --   HGB  --    < > 8.9*   < > 8.1*   < > 7.1* 6.5* 8.3* 8.6* 7.5*  HCT  --    < > 26.4*   < > 23.2*   < > 20.5* 20.0* 24.8* 26.0* 22.0*  MCV  --    < > 80.0  --  77.9*  --  79.2* 83.3  --  85.2  --   PLT  --    < > 390  --  377  --  409* 336  --  286  --    < > = values in this interval not displayed.    Basic Metabolic Panel: Recent Labs  Lab 02/07/20 2035 02/08/20 0012 02/08/20 0402 02/08/20 0755 02/08/20 1159 02/08/20 1159 02/08/20 1646 02/08/20 1646 02/08/20 2124 02/08/20 2124 02/09/20 0016 02/09/20 0800 02/09/20 1428 02/09/20 1959 Feb 29, 2020 0121 02-29-2020 0519 2020-02-29 0636 02-29-2020 0739 02/29/2020 1322  NA 122*   < > 125*   <  > 125*   < > 128*   < > 128*   < > 127*   < > 142   < > 151* 152* 154* 153* 153*  K 3.1*   < > 3.7   < > 3.8   < > 3.7  --  3.6  --  3.6  --   --   --   --  2.7* 2.2*  --   --   CL 94*   < > 93*   < > 95*  --  96*  --  95*  --  98  --   --   --   --  124*  --   --   --   CO2 22   < > 22   < > 23  --  23  --  23  --  20*  --   --   --   --  22  --   --   --   GLUCOSE 123*   < > 132*   < > 137*  --  115*  --  133*  --  124*  --   --   --   --  112*  --   --   --   BUN 9   < > 9   < > 10  --  12  --  12  --  16  --   --   --   --  24*  --   --   --   CREATININE 0.41*   < > 0.42*   < > 0.43*  --  0.46  --  0.43*  --  0.70  --   --   --   --  0.81  --   --   --   CALCIUM 8.1*   < > 8.5*   < > 8.5*  --  8.6*  --  8.8*  --  8.2*  --   --   --   --  8.7*  --   --   --   MG 1.7  --  2.0  --   --   --  2.1  --   --   --   --   --  2.4  --   --  2.2  --   --   --   PHOS 1.8*   < > 1.3*  --   --   --  <1.0*  --   --   --  2.5  --  2.2*  --   --  2.7  --   --   --    < > = values in this interval not displayed.   GFR: Estimated Creatinine Clearance: 46.5 mL/min (by C-G formula based on SCr of 0.81 mg/dL). Recent Labs  Lab 02/05/2020 0940 02/05/2020 1411 02/08/20 0402 02/09/20 0151 02/09/20 1804 2020/02/22 0519  WBC 21.4*   < > 20.9* 24.1* 19.6* 18.5*  LATICACIDVEN 1.7  --   --   --   --   --    < > = values in this interval not displayed.    Liver Function Tests: Recent Labs  Lab 02/27/2020 0940 02/09/20 0016  AST 27 18  ALT 41 16  ALKPHOS 63 53  BILITOT 1.5* 0.4  PROT 7.0 4.4*  ALBUMIN 3.2* 1.6*   Recent Labs  Lab 02/08/2020 0940  LIPASE 22   No results for input(s): AMMONIA in the last 168 hours.  ABG    Component Value Date/Time   PHART 7.242 (L) 02-22-20 0636   PCO2ART 45.3 2020-02-22 0636   PO2ART 53.0 (L) 2020-02-22 0636   HCO3 19.6 (L) 02/22/2020 0636   TCO2 21 (L) Feb 22, 2020 0636   ACIDBASEDEF 7.0 (H) 02-22-2020 0636   O2SAT 82.0 22-Feb-2020 0636     Coagulation  Profile: Recent Labs  Lab 02/28/2020 1009  INR 1.0    Cardiac Enzymes: No results for input(s): CKTOTAL, CKMB, CKMBINDEX, TROPONINI in the last 168 hours.  HbA1C: Hgb A1c MFr Bld  Date/Time Value Ref Range Status  02/05/2020 03:50 PM 5.3 4.8 - 5.6 % Final    Comment:    (NOTE) Pre diabetes:          5.7%-6.4% Diabetes:              >6.4% Glycemic  control for   <7.0% adults with diabetes     CBG: Recent Labs  Lab 02/09/20 1949 02/09/20 2343 February 11, 2020 0530 02-11-20 0744 02/11/20 1048  GLUCAP 149* 128* 109* 95 108*    CRITICAL CARE The patient is critically ill with multiple organ systems failure and requires high complexity decision making for assessment and support, frequent evaluation and titration of therapies, application of advanced monitoring technologies and extensive interpretation of multiple databases.   Critical Care Time devoted to patient care services described in this note is 40 minutes. This time reflects time of care of this Daniels . This critical care time does not reflect separately billable procedures or procedure time, teaching time or supervisory time of PA/NP/Med student/Med Resident etc but could involve care discussion time.  [facility] February 11, 2020 3:22 PM  Pager: [phone] After hours pager: [phone]

## 2020-03-05 DEATH — deceased

## 2020-12-11 IMAGING — CT CT HEAD CODE STROKE
3 series · 15 of 47 positions shown, 18 images · non-contrast
Comparison: MRI head 03/30/2005

CLINICAL DATA: Code stroke. Neuro deficit acute. Rule out stroke.
Altered mental status.

EXAM:
CT HEAD WITHOUT CONTRAST
TECHNIQUE: Contiguous axial images were obtained from the base of the skull
through the vertex without intravenous contrast.

[Series 4: head 5.0 st · axial · 0.46mm/px · z∈[+1137,+1272]mm · 9 of 33 slices shown, 12 images]
[im 3/33  brain]
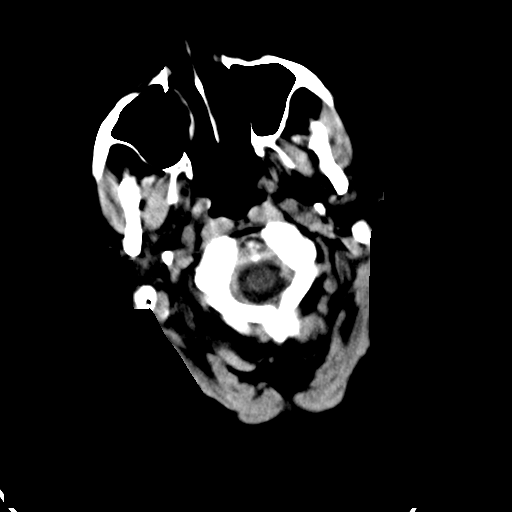
[im 3/33  bone]
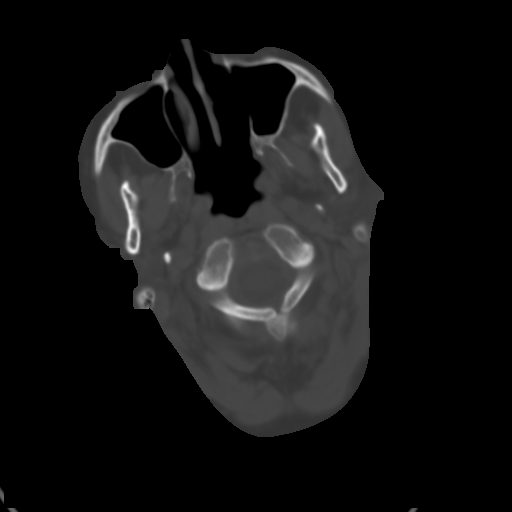
[im 6/33  brain]
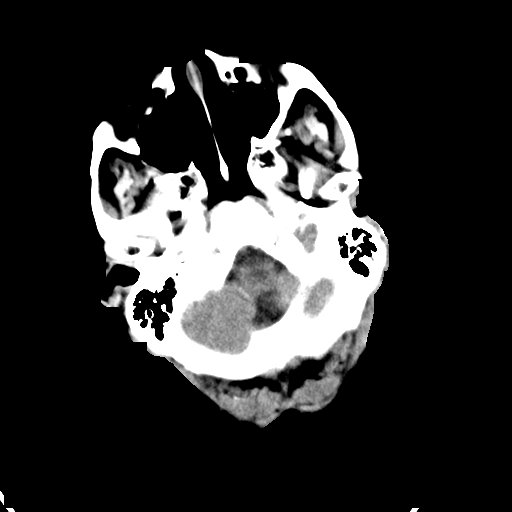
[im 9/33  brain]
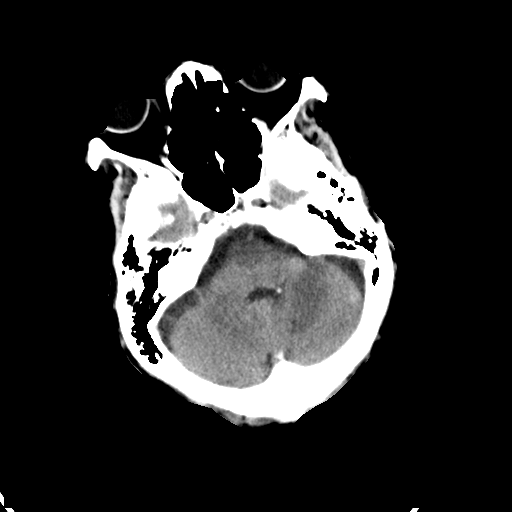
[im 13/33  brain]
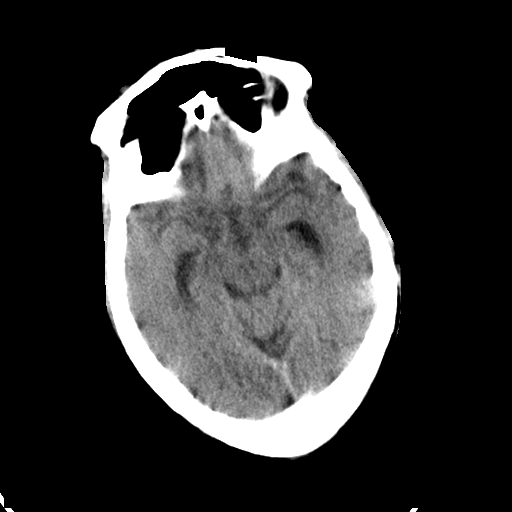
[im 17/33  brain]
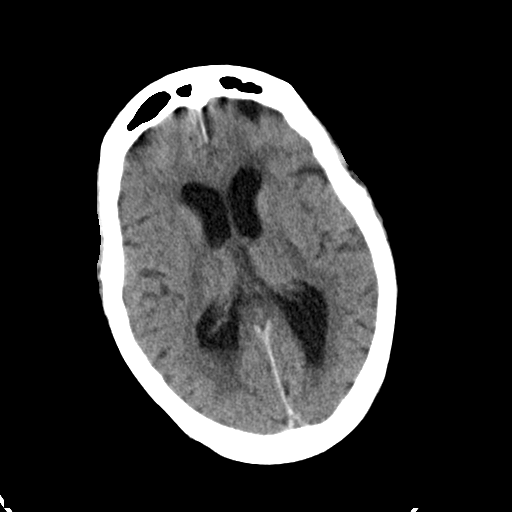
[im 17/33  bone]
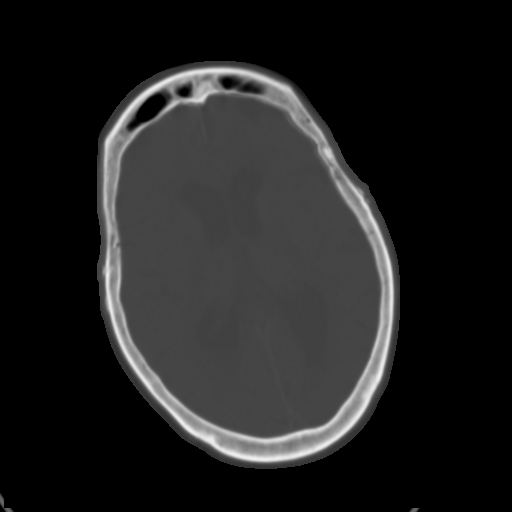
[im 20/33  brain]
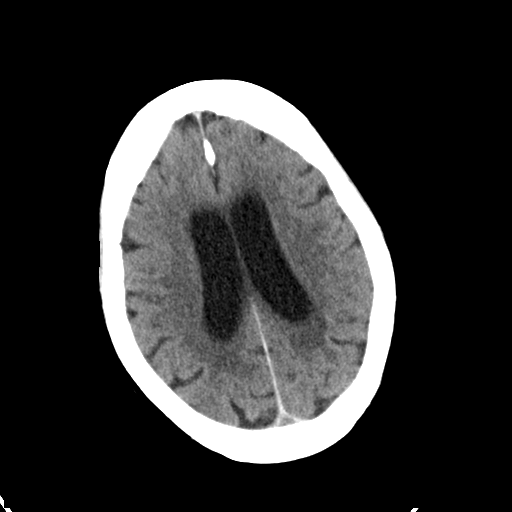
[im 24/33  brain]
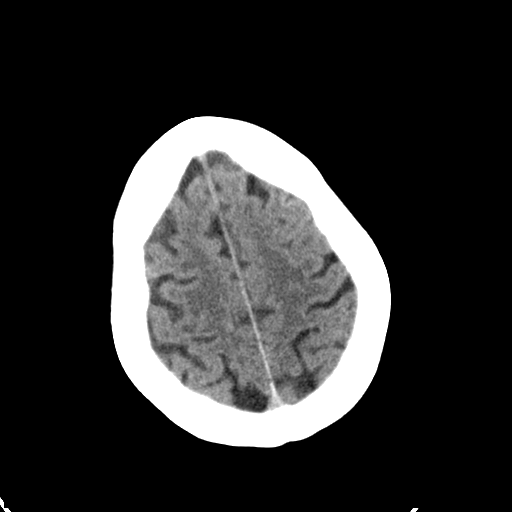
[im 27/33  brain]
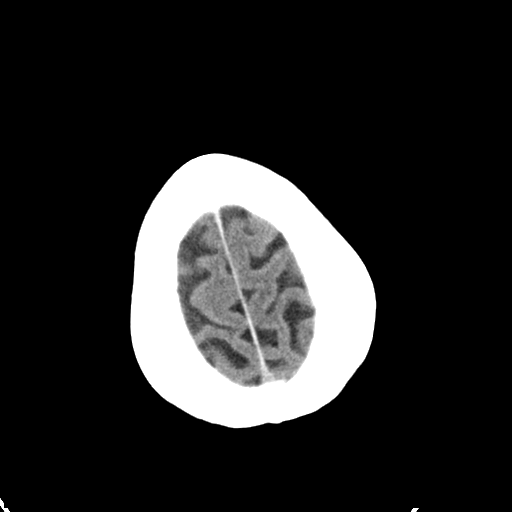
[im 30/33  brain]
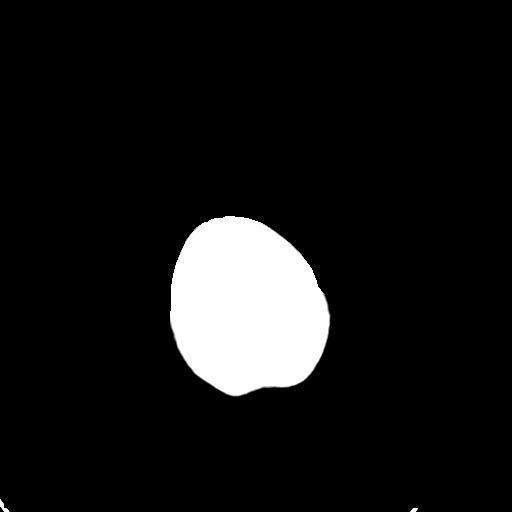
[im 30/33  bone]
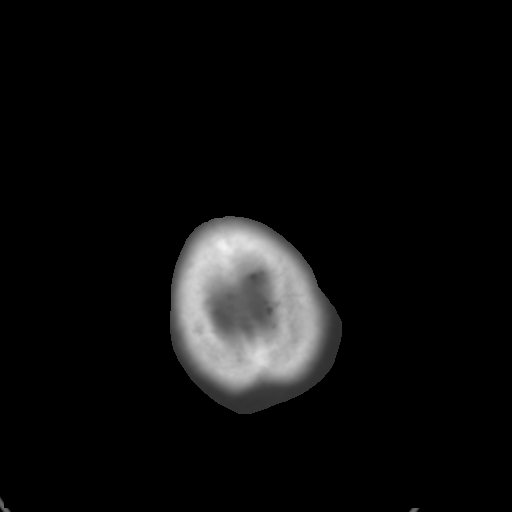

[Series 5: head 3.0 cor st · coronal · 0.31mm/px · 3 of 71 slices shown]
[im 24/71  brain]
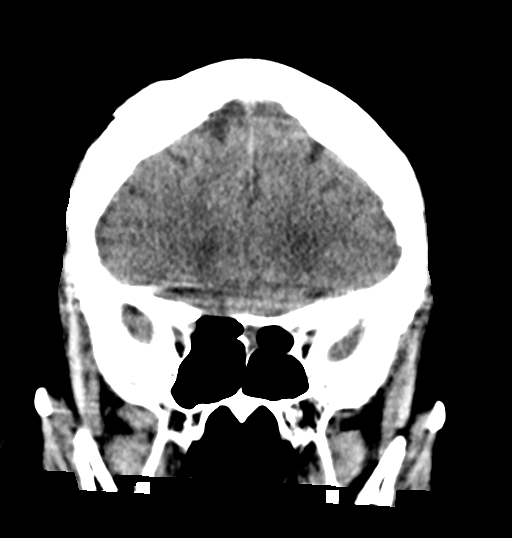
[im 32/71  brain]
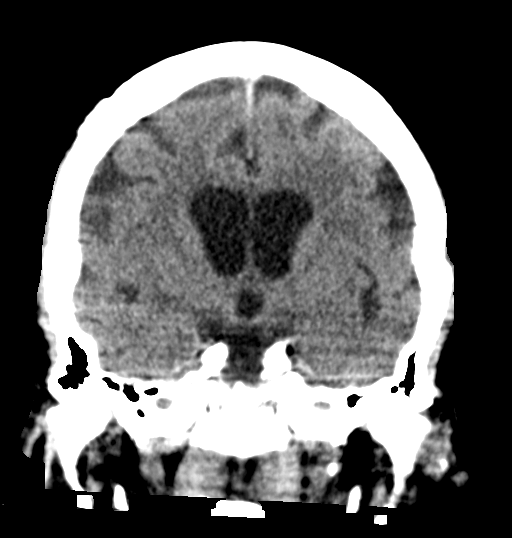
[im 39/71  brain]
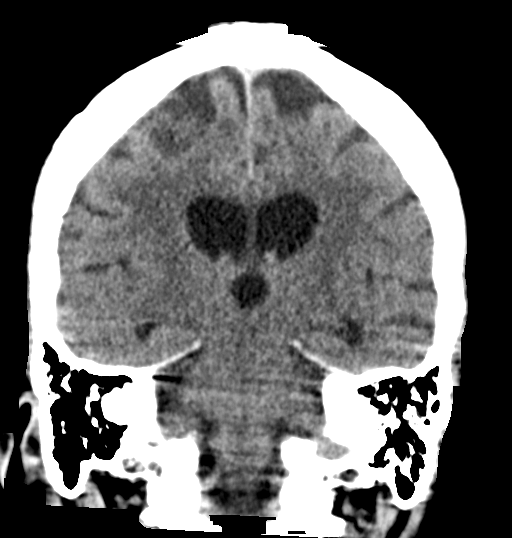

[Series 6: head 3.0 sag st · sagittal · 0.32mm/px · 3 of 52 slices shown]
[im 18/52  brain]
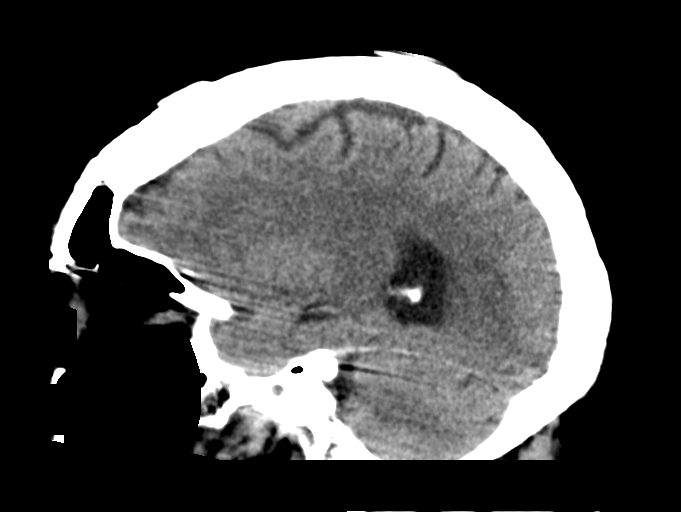
[im 26/52  brain]
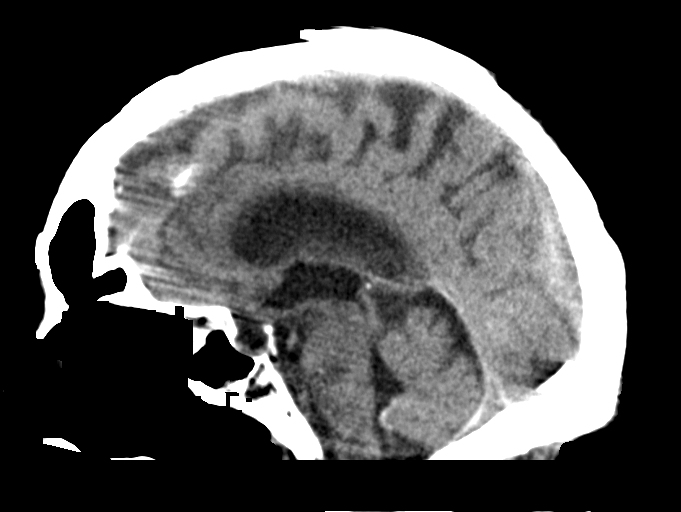
[im 35/52  brain]
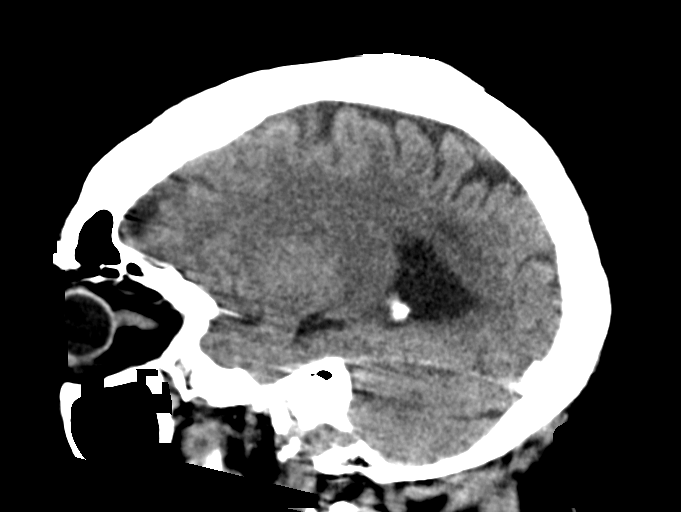

[15 of 47 positions shown; findings below may reference images not displayed]

FINDINGS: Brain: Progressive enlargement of the third and lateral ventricles
with rounded frontal horns and rounded third ventricle suggesting
obstruction. Mild dilatation of the proximal aqueduct. Fourth
ventricle normal in size. Findings suggest obstructive
hydrocephalus.

Mild atrophy. Chronic microvascular ischemic change in the white
matter. No acute infarct, hemorrhage, mass.

Vascular: Negative for hyperdense vessel

Skull: Negative

Sinuses/Orbits: Paranasal sinuses clear. Prior sinus surgery
involving the left maxillary sinus. Bilateral cataract extraction.

Other: None

ASPECTS (Alberta Stroke Program Early CT Score)

- Ganglionic level infarction (caudate, lentiform nuclei, internal
capsule, insula, M1-M3 cortex): 7

- Supraganglionic infarction (M4-M6 cortex): 3

Total score (0-10 with 10 being normal): 10
IMPRESSION: 1. No intracranial hemorrhage or acute infarct
2. Mild to moderate hydrocephalus has developed since 9882. No
interval studies to determine chronicity. This appears to be
obstructive hydrocephalus at the aqueduct.
3. ASPECTS is 10
4. These results were called by telephone at the time of
verbally acknowledged these results.
